# Patient Record
Sex: Male | Born: 1960
Health system: Southern US, Community
[De-identification: ages and names within clinical notes are randomized; demographics above are authoritative.]

## PROBLEM LIST (undated history)

## (undated) DIAGNOSIS — F329 Major depressive disorder, single episode, unspecified: Secondary | ICD-10-CM

## (undated) DIAGNOSIS — I10 Essential (primary) hypertension: Secondary | ICD-10-CM

## (undated) DIAGNOSIS — M719 Bursopathy, unspecified: Secondary | ICD-10-CM

## (undated) DIAGNOSIS — Z85819 Personal history of malignant neoplasm of unspecified site of lip, oral cavity, and pharynx: Secondary | ICD-10-CM

## (undated) DIAGNOSIS — E785 Hyperlipidemia, unspecified: Secondary | ICD-10-CM

## (undated) DIAGNOSIS — C439 Malignant melanoma of skin, unspecified: Secondary | ICD-10-CM

## (undated) DIAGNOSIS — F419 Anxiety disorder, unspecified: Secondary | ICD-10-CM

## (undated) DIAGNOSIS — F32A Depression, unspecified: Secondary | ICD-10-CM

## (undated) DIAGNOSIS — E119 Type 2 diabetes mellitus without complications: Secondary | ICD-10-CM

## (undated) DIAGNOSIS — E1142 Type 2 diabetes mellitus with diabetic polyneuropathy: Secondary | ICD-10-CM

## (undated) DIAGNOSIS — C799 Secondary malignant neoplasm of unspecified site: Secondary | ICD-10-CM

## (undated) DIAGNOSIS — K648 Other hemorrhoids: Secondary | ICD-10-CM

## (undated) DIAGNOSIS — K529 Noninfective gastroenteritis and colitis, unspecified: Secondary | ICD-10-CM

## (undated) DIAGNOSIS — K635 Polyp of colon: Secondary | ICD-10-CM

## (undated) HISTORY — PX: MELANOMA EXCISION: SHX5266

## (undated) HISTORY — PX: POLYPECTOMY: SHX149

## (undated) HISTORY — DX: Anxiety disorder, unspecified: F41.9

## (undated) HISTORY — DX: Secondary malignant neoplasm of unspecified site: C79.9

## (undated) HISTORY — PX: OTHER SURGICAL HISTORY: SHX169

## (undated) HISTORY — DX: Malignant melanoma of skin, unspecified: C43.9

## (undated) HISTORY — DX: Polyp of colon: K63.5

## (undated) HISTORY — DX: Noninfective gastroenteritis and colitis, unspecified: K52.9

## (undated) HISTORY — DX: Type 2 diabetes mellitus with diabetic polyneuropathy: E11.42

## (undated) HISTORY — DX: Bursopathy, unspecified: M71.9

## (undated) HISTORY — PX: COLONOSCOPY: SHX174

## (undated) HISTORY — DX: Other hemorrhoids: K64.8

## (undated) HISTORY — DX: Major depressive disorder, single episode, unspecified: F32.9

## (undated) HISTORY — DX: Depression, unspecified: F32.A

## (undated) HISTORY — DX: Essential (primary) hypertension: I10

## (undated) HISTORY — DX: Personal history of malignant neoplasm of unspecified site of lip, oral cavity, and pharynx: Z85.819

## (undated) HISTORY — DX: Hyperlipidemia, unspecified: E78.5

---

## 1999-03-15 ENCOUNTER — Emergency Department (HOSPITAL_COMMUNITY): Admission: EM | Admit: 1999-03-15 | Discharge: 1999-03-15 | Payer: Self-pay | Admitting: Emergency Medicine

## 2000-08-03 ENCOUNTER — Encounter: Payer: Self-pay | Admitting: Emergency Medicine

## 2000-08-03 ENCOUNTER — Emergency Department (HOSPITAL_COMMUNITY): Admission: EM | Admit: 2000-08-03 | Discharge: 2000-08-03 | Payer: Self-pay | Admitting: Emergency Medicine

## 2000-08-08 ENCOUNTER — Ambulatory Visit: Admission: RE | Admit: 2000-08-08 | Discharge: 2000-08-08 | Payer: Self-pay | Admitting: Internal Medicine

## 2001-06-24 ENCOUNTER — Encounter: Payer: Self-pay | Admitting: Emergency Medicine

## 2001-06-24 ENCOUNTER — Emergency Department (HOSPITAL_COMMUNITY): Admission: EM | Admit: 2001-06-24 | Discharge: 2001-06-24 | Payer: Self-pay | Admitting: Emergency Medicine

## 2002-04-29 ENCOUNTER — Encounter: Admission: RE | Admit: 2002-04-29 | Discharge: 2002-05-13 | Payer: Self-pay | Admitting: Internal Medicine

## 2002-10-20 ENCOUNTER — Emergency Department (HOSPITAL_COMMUNITY): Admission: EM | Admit: 2002-10-20 | Discharge: 2002-10-20 | Payer: Self-pay | Admitting: Emergency Medicine

## 2002-10-20 ENCOUNTER — Inpatient Hospital Stay (HOSPITAL_COMMUNITY): Admission: EM | Admit: 2002-10-20 | Discharge: 2002-10-23 | Payer: Self-pay | Admitting: *Deleted

## 2006-10-18 ENCOUNTER — Ambulatory Visit: Payer: Self-pay | Admitting: Internal Medicine

## 2006-11-05 ENCOUNTER — Inpatient Hospital Stay (HOSPITAL_COMMUNITY): Admission: EM | Admit: 2006-11-05 | Discharge: 2006-11-06 | Payer: Self-pay | Admitting: Emergency Medicine

## 2006-11-05 ENCOUNTER — Ambulatory Visit: Payer: Self-pay | Admitting: Cardiology

## 2006-11-06 ENCOUNTER — Ambulatory Visit: Payer: Self-pay

## 2007-08-06 ENCOUNTER — Ambulatory Visit: Payer: Self-pay | Admitting: Internal Medicine

## 2007-08-17 ENCOUNTER — Ambulatory Visit (HOSPITAL_COMMUNITY): Admission: RE | Admit: 2007-08-17 | Discharge: 2007-08-17 | Payer: Self-pay | Admitting: Internal Medicine

## 2007-08-17 ENCOUNTER — Encounter: Payer: Self-pay | Admitting: Internal Medicine

## 2007-08-27 ENCOUNTER — Ambulatory Visit: Payer: Self-pay | Admitting: Internal Medicine

## 2008-01-03 DIAGNOSIS — C439 Malignant melanoma of skin, unspecified: Secondary | ICD-10-CM | POA: Insufficient documentation

## 2008-01-03 DIAGNOSIS — D126 Benign neoplasm of colon, unspecified: Secondary | ICD-10-CM | POA: Insufficient documentation

## 2008-01-03 DIAGNOSIS — K649 Unspecified hemorrhoids: Secondary | ICD-10-CM | POA: Insufficient documentation

## 2008-08-14 ENCOUNTER — Telehealth: Payer: Self-pay | Admitting: Internal Medicine

## 2008-08-14 ENCOUNTER — Ambulatory Visit: Payer: Self-pay | Admitting: Internal Medicine

## 2008-08-14 DIAGNOSIS — R1032 Left lower quadrant pain: Secondary | ICD-10-CM | POA: Insufficient documentation

## 2008-08-14 LAB — CONVERTED CEMR LAB
Bacteria, UA: NEGATIVE
Basophils Absolute: 0 10*3/uL (ref 0.0–0.1)
Basophils Relative: 0.4 % (ref 0.0–3.0)
Bilirubin Urine: NEGATIVE
Crystals: NEGATIVE
Eosinophils Absolute: 0.1 10*3/uL (ref 0.0–0.7)
Eosinophils Relative: 1.4 % (ref 0.0–5.0)
HCT: 46 % (ref 39.0–52.0)
Hemoglobin, Urine: NEGATIVE
Hemoglobin: 15.9 g/dL (ref 13.0–17.0)
Ketones, ur: NEGATIVE mg/dL
Leukocytes, UA: NEGATIVE
Lymphocytes Relative: 34.1 % (ref 12.0–46.0)
MCHC: 34.5 g/dL (ref 30.0–36.0)
MCV: 90.6 fL (ref 78.0–100.0)
Monocytes Absolute: 0.7 10*3/uL (ref 0.1–1.0)
Monocytes Relative: 10.7 % (ref 3.0–12.0)
Neutro Abs: 3.2 10*3/uL (ref 1.4–7.7)
Neutrophils Relative %: 53.4 % (ref 43.0–77.0)
Nitrite: NEGATIVE
Platelets: 220 10*3/uL (ref 150–400)
RBC: 5.07 M/uL (ref 4.22–5.81)
RDW: 12.1 % (ref 11.5–14.6)
Specific Gravity, Urine: 1.015 (ref 1.000–1.03)
Squamous Epithelial / HPF: NEGATIVE /lpf
Total Protein, Urine: NEGATIVE mg/dL
Urine Glucose: NEGATIVE mg/dL
Urobilinogen, UA: 0.2 (ref 0.0–1.0)
WBC: 6.1 10*3/uL (ref 4.5–10.5)
pH: 7.5 (ref 5.0–8.0)

## 2008-10-24 HISTORY — PX: SHOULDER SURGERY: SHX246

## 2008-10-29 ENCOUNTER — Telehealth: Payer: Self-pay | Admitting: Internal Medicine

## 2008-10-31 ENCOUNTER — Ambulatory Visit (HOSPITAL_COMMUNITY): Admission: RE | Admit: 2008-10-31 | Discharge: 2008-10-31 | Payer: Self-pay | Admitting: Internal Medicine

## 2008-10-31 ENCOUNTER — Ambulatory Visit: Payer: Self-pay | Admitting: Internal Medicine

## 2008-10-31 DIAGNOSIS — K625 Hemorrhage of anus and rectum: Secondary | ICD-10-CM | POA: Insufficient documentation

## 2008-10-31 LAB — CONVERTED CEMR LAB
ALT: 28 units/L (ref 0–53)
AST: 19 units/L (ref 0–37)
Albumin: 4.2 g/dL (ref 3.5–5.2)
Alkaline Phosphatase: 67 units/L (ref 39–117)
BUN: 14 mg/dL (ref 6–23)
Bacteria, UA: NEGATIVE
Basophils Absolute: 0 10*3/uL (ref 0.0–0.1)
Basophils Relative: 0.4 % (ref 0.0–3.0)
Bilirubin Urine: NEGATIVE
CO2: 30 meq/L (ref 19–32)
Calcium: 9.3 mg/dL (ref 8.4–10.5)
Chloride: 104 meq/L (ref 96–112)
Creatinine, Ser: 1 mg/dL (ref 0.4–1.5)
Crystals: NEGATIVE
Eosinophils Absolute: 0.1 10*3/uL (ref 0.0–0.7)
Eosinophils Relative: 1.3 % (ref 0.0–5.0)
GFR calc Af Amer: 103 mL/min
GFR calc non Af Amer: 85 mL/min
Glucose, Bld: 100 mg/dL — ABNORMAL HIGH (ref 70–99)
HCT: 45.6 % (ref 39.0–52.0)
Hemoglobin, Urine: NEGATIVE
Hemoglobin: 16.1 g/dL (ref 13.0–17.0)
Ketones, ur: NEGATIVE mg/dL
Leukocytes, UA: NEGATIVE
Lymphocytes Relative: 32.3 % (ref 12.0–46.0)
MCHC: 35.2 g/dL (ref 30.0–36.0)
MCV: 88.7 fL (ref 78.0–100.0)
Monocytes Absolute: 0.6 10*3/uL (ref 0.1–1.0)
Monocytes Relative: 9.4 % (ref 3.0–12.0)
Mucus, UA: NEGATIVE
Neutro Abs: 3.4 10*3/uL (ref 1.4–7.7)
Neutrophils Relative %: 56.6 % (ref 43.0–77.0)
Nitrite: NEGATIVE
Platelets: 210 10*3/uL (ref 150–400)
Potassium: 4.1 meq/L (ref 3.5–5.1)
RBC: 5.15 M/uL (ref 4.22–5.81)
RDW: 12.3 % (ref 11.5–14.6)
Sodium: 140 meq/L (ref 135–145)
Specific Gravity, Urine: >=1.03 (ref 1.000–1.03)
Squamous Epithelial / HPF: NEGATIVE /lpf
Total Bilirubin: 1 mg/dL (ref 0.3–1.2)
Total Protein, Urine: NEGATIVE mg/dL
Total Protein: 7 g/dL (ref 6.0–8.3)
Urine Glucose: NEGATIVE mg/dL
Urobilinogen, UA: 0.2 (ref 0.0–1.0)
WBC: 6.1 10*3/uL (ref 4.5–10.5)
pH: 5 (ref 5.0–8.0)

## 2008-11-03 ENCOUNTER — Ambulatory Visit: Payer: Self-pay | Admitting: Internal Medicine

## 2008-11-03 ENCOUNTER — Ambulatory Visit (HOSPITAL_COMMUNITY): Admission: RE | Admit: 2008-11-03 | Discharge: 2008-11-03 | Payer: Self-pay | Admitting: Internal Medicine

## 2008-11-03 ENCOUNTER — Encounter: Payer: Self-pay | Admitting: Internal Medicine

## 2008-11-04 ENCOUNTER — Encounter: Payer: Self-pay | Admitting: Internal Medicine

## 2009-09-16 ENCOUNTER — Encounter: Admission: RE | Admit: 2009-09-16 | Discharge: 2009-09-16 | Payer: Self-pay | Admitting: General Practice

## 2009-11-12 ENCOUNTER — Ambulatory Visit (HOSPITAL_BASED_OUTPATIENT_CLINIC_OR_DEPARTMENT_OTHER): Admission: RE | Admit: 2009-11-12 | Discharge: 2009-11-12 | Payer: Self-pay | Admitting: Orthopedic Surgery

## 2009-12-14 ENCOUNTER — Telehealth: Payer: Self-pay | Admitting: Internal Medicine

## 2009-12-14 ENCOUNTER — Emergency Department (HOSPITAL_COMMUNITY): Admission: EM | Admit: 2009-12-14 | Discharge: 2009-12-14 | Payer: Self-pay | Admitting: Emergency Medicine

## 2009-12-18 ENCOUNTER — Telehealth: Payer: Self-pay | Admitting: Internal Medicine

## 2009-12-18 ENCOUNTER — Ambulatory Visit: Payer: Self-pay | Admitting: Internal Medicine

## 2010-11-16 ENCOUNTER — Ambulatory Visit: Payer: Self-pay | Admitting: Internal Medicine

## 2010-11-23 LAB — CBC WITH DIFFERENTIAL/PLATELET
BASO%: 0.4 % (ref 0.0–2.0)
Basophils Absolute: 0 10*3/uL (ref 0.0–0.1)
EOS%: 1.2 % (ref 0.0–7.0)
Eosinophils Absolute: 0.1 10*3/uL (ref 0.0–0.5)
HCT: 44.7 % (ref 38.4–49.9)
HGB: 15.2 g/dL (ref 13.0–17.1)
LYMPH%: 26.8 % (ref 14.0–49.0)
MCH: 30.2 pg (ref 27.2–33.4)
MCHC: 34.1 g/dL (ref 32.0–36.0)
MCV: 88.5 fL (ref 79.3–98.0)
MONO#: 0.4 10*3/uL (ref 0.1–0.9)
MONO%: 8.1 % (ref 0.0–14.0)
NEUT#: 3.2 10*3/uL (ref 1.5–6.5)
NEUT%: 63.5 % (ref 39.0–75.0)
Platelets: 194 10*3/uL (ref 140–400)
RBC: 5.05 10*6/uL (ref 4.20–5.82)
RDW: 13.2 % (ref 11.0–14.6)
WBC: 5.1 10*3/uL (ref 4.0–10.3)
lymph#: 1.4 10*3/uL (ref 0.9–3.3)

## 2010-11-23 LAB — COMPREHENSIVE METABOLIC PANEL
ALT: 13 U/L (ref 0–53)
AST: 15 U/L (ref 0–37)
Albumin: 4.5 g/dL (ref 3.5–5.2)
Alkaline Phosphatase: 76 U/L (ref 39–117)
BUN: 14 mg/dL (ref 6–23)
CO2: 26 mEq/L (ref 19–32)
Calcium: 9.3 mg/dL (ref 8.4–10.5)
Chloride: 104 mEq/L (ref 96–112)
Creatinine, Ser: 0.96 mg/dL (ref 0.40–1.50)
Glucose, Bld: 101 mg/dL — ABNORMAL HIGH (ref 70–99)
Potassium: 4.3 mEq/L (ref 3.5–5.3)
Sodium: 143 mEq/L (ref 135–145)
Total Bilirubin: 0.7 mg/dL (ref 0.3–1.2)
Total Protein: 6.8 g/dL (ref 6.0–8.3)

## 2010-11-23 LAB — LACTATE DEHYDROGENASE: LDH: 131 U/L (ref 94–250)

## 2010-11-23 NOTE — Procedures (Signed)
Summary: Colonoscopy  (polyp)   Colonoscopy  Procedure date:  11/03/2008  Findings:      Location:  [facility]    COLONOSCOPY PROCEDURE REPORT  PATIENT:  Nathaniel Turner, Nathaniel Turner  MR#:  [id number] BIRTHDATE:   05-29-1961   GENDER:   male  ENDOSCOPIST:   Hedwig Morton. Juanda Chance, MD Referred by: Louanna Raw, M.D.  PROCEDURE DATE:  11/03/2008 PROCEDURE:  Colonoscopy with biopsy and snare polypectomy ASA CLASS:   Class II INDICATIONS: abdominal pain, hematochezia acute abd. pain, KUB c/w resolving SBO, hx of Stage 4 melanoma, last colonoscopy Oct 2008, hx of hamartoma left colon  MEDICATIONS:    Versed 8 mcg, Fentanyl 100 mcg  DESCRIPTION OF PROCEDURE:   After the risks benefits and alternatives of the procedure were thoroughly explained, informed consent was obtained.  Digital rectal exam was performed and revealed no rectal masses.   The EC-[id number] ([id number]) endoscope was introduced through the anus and advanced to the cecum, which was identified by both the appendix and ileocecal valve, without limitations.  The quality of the prep was excellent, using MiraLax.  The instrument was then slowly withdrawn as the colon was fully examined. <<PROCEDUREIMAGES>>                <<OLD IMAGES>>  FINDINGS:  normal cecum (see image002 and image003).  A pedunculated polyp was found in the sigmoid colon. It was 2 cm in size. It was found 40 cm from the point of entry. Polyp was snared, then cauterized with monopolar cautery. Retrieval was successful (see image005 and image006). snare polyp large pedunculated polyp removed from 40 cm, appears multilobulated  Colitis was found in the sigmoid colon. multiple pinpoint circular erosions and abrasions in the left colon, not bleeding, c/w nonspecific,? infectious colitis, With standard forceps, biopsy was obtained and sent to pathology (see image004, image007, and image008).  Internal hemorrhoids were found (see image009 and image010). bleeding hem in the anal canal    Retroflexed views in the rectum revealed no abnormalities.    The scope was then withdrawn from the patient and the procedure completed.  COMPLICATIONS:   None  ENDOSCOPIC IMPRESSION:  1) Normal cecum  2) 2 cm pedunculated polyp in the sigmoid colon  3) Colitis in the sigmoid colon  4) Internal hemorrhoids  s/p snare polypectomy of a large sigmoid polyp,  nonspecific, resolving colitis in the sigmoid colon, likely infectious  bleeding hemorrhoid RECOMMENDATIONS:  1) await pathology results  2) Anusol HC supp. prn  advance diet to full liquids and to low residue diet  continue Cipro for 3 more days  AnusolHC cream and supp daily  Office Visit 2 weeks  REPEAT EXAM:   In 3 year(s)  (Recall is in IDX for 10/2011. Laureen Ochs LPN  November 05, 2008 10:03 AM)    _______________________________ Hedwig Morton. Juanda Chance, MD  CC:    This report was created from the original endoscopy report, which was reviewed and signed by the above listed endoscopist.

## 2010-11-23 NOTE — Miscellaneous (Signed)
Summary: Waiver of Liability/[place] [employer], industrial/product of Liability/[facility]   Imported By: Lester [place] 11/08/2008 08:54:31  _____________________________________________________________________  External Attachment:    Type:   Image     Comment:   External Document

## 2010-11-23 NOTE — Progress Notes (Signed)
Summary: TRIAGE   Phone Note Call from Patient Call back at Home Phone [phone]   Caller: Patient Call For: Juanda Chance Reason for Call: Talk to Nurse Summary of Call: Patient states that he has severe diarrhea and his rectum burns wants to know what to do. Initial call taken by: Tawni Levy,  December 18, 2009 3:54 PM  Follow-up for Phone Call        Since pt. saw Dr.Vann Okerlund this morning he has had 3 watery,yellow,burning BM's. States he burns so bad, "I had to run and jump in a tub of water" Heis on his way to the surgical appt. scheduled for him this morning.  1) Continue all instructions given to you this morning 2) Immodium as needed for diarrhea. 3) Keep appt. w/surgeon 4) I will call pt., if new orders, after MD reviews.  Follow-up by: Laureen Ochs LPN,  December 18, 2009 3:59 PM  Additional Follow-up for Phone Call Additional follow up Details #1::        please call pt on Monday 2/28, ask if he is better. If not, offer Hydrocodone 10/500, # 20 1 by mouth q 4hrs as needed,( I hope the strenght is right, could be 10/650) Additional Follow-up by: Hart Carwin MD,  December 19, 2009 2:32 PM    Additional Follow-up for Phone Call Additional follow up Details #2::    Message left for patient to callback. Laureen Ochs LPN  December 21, 2009 8:41 AM   Pt. saw the surgeon and was given a cream to use after every BM, states the inflammation has subsided, no further burning diarrhea. Pt. instructed to call back as needed.  Follow-up by: Laureen Ochs LPN,  December 21, 2009 9:03 AM

## 2010-11-23 NOTE — Assessment & Plan Note (Signed)
Summary: F/U FROM TRIAGE 10-29-08    (1PM APPT. PER DR.Abdoulaye Drum)    DEBORAH    History of Present Illness Visit Type: follow up Primary GI MD: Lina Sar MD Primary Provider: Antionette Char MD Requesting Provider: n/a Chief Complaint: f/u from traige diarrhea, nausea, vomitting History of Present Illness:   This is a 50 year old white male with stage IV melanoma followed at [facility]. Last appointment there was in December 2009 where he had a normal CT scan of the chest, abdomen and pelvis. He is an acute add on today because of acute abdominal pain which started 2 days ago while at the beach. After having a couple drinks and a regular supper, he woke up in the morning with severe abdominal pain, diarrhea and vomiting. He came back from the beach and called our office.We asked pt to stay on  clear liquids and take  Cipro 250 mg twice a day. He is somewhat improved but has not had any bowel movements. He has seen some blood per rectum. Patient had a colonoscopy in October 2008 with findings of a large hamartomatous  polyp of the left colon.While at [facility] in december 2009, his oncologist asked him to arrange for repeat colonoscopy.   GI Review of Systems    Reports abdominal pain, nausea, and  vomiting.     Location of  Abdominal pain: lower abdomen.    Denies acid reflux, belching, bloating, chest pain, dysphagia with liquids, dysphagia with solids, heartburn, loss of appetite, vomiting blood, weight loss, and  weight gain.      Reports diarrhea, rectal bleeding, and  rectal pain.     Denies anal fissure, black tarry stools, change in bowel habit, constipation, diverticulosis, fecal incontinence, heme positive stool, hemorrhoids, irritable bowel syndrome, jaundice, light color stool, and  liver problems.     Prior Medications Reviewed Using: Patient Recall  Updated Prior Medication List: BACTRIM 400-80 MG TABS (SULFAMETHOXAZOLE-TRIMETHOPRIM) 1 tablet every other day CIPRO 250 MG TABS (CIPROFLOXACIN HCL)  Take 1 tablet by mouth two times a day x 7 days  Current Allergies (reviewed today): No known allergies   Past Medical History:    Reviewed history from 01/03/2008 and no changes required:       Current Problems:        INTERNAL HEMORRHOIDS (ICD-455.0)       POLYP, COLON (ICD-211.3)       MELANOMA OF SKIN, SITE UNSPECIFIED (ICD-172.9)       HEMORRHOIDS (ICD-455.6)         Past Surgical History:    Reviewed history from 10/30/2008 and no changes required:       Melanoma Surgery with Lymph node removal 2008   Family History:    Reviewed history from 08/14/2008 and no changes required:       No FH of Colon Cancer:       Family History of Diabetes:grandmother        Family History of Heart Disease: grandfather  Social History:    Reviewed history from 08/14/2008 and no changes required:       Patient is a former smoker.        Alcohol Use - no       Illicit Drug Use - no    Review of Systems  The patient denies allergy/sinus, anemia, anxiety-new, arthritis/joint pain, back pain, blood in urine, breast changes/lumps, change in vision, confusion, cough, coughing up blood, depression-new, fainting, fatigue, fever, headaches-new, hearing problems, heart murmur, heart rhythm changes, itching, menstrual  pain, muscle pains/cramps, night sweats, nosebleeds, pregnancy symptoms, shortness of breath, skin rash, sleeping problems, sore throat, swelling of feet/legs, swollen lymph glands, thirst - excessive , urination - excessive , urination changes/pain, urine leakage, vision changes, and voice change.     Vital Signs:  Patient Profile:   50 Years Old Male Height:     73 inches Weight:      223.38 pounds BMI:     29.58 Pulse rate:   70 / minute Pulse rhythm:   regular BP sitting:   120 / 80  (left arm)  Vitals Entered By: Hortense Ramal CMA (October 31, 2008 1:03 PM)                  Physical Exam  General:     alert, oriented, in no distress. Well-developed muscular man,  Mouth:     No deformity or lesions, dentition normal. Neck:     no lymphadenopathy. Chest Wall:     scars on the back from melanoma excision. Lungs:     Clear throughout to auscultation. Heart:     Regular rate and rhythm; no murmurs, rubs or bruits. Abdomen:     soft abdomen with normal active bowel sounds, no distention, mild tenderness in left lower quadrant on deep pressure extending to the suprapubic area and periumbilical area. No fluid wave. Rectal:     bright red blood on the glove which was hemoccult-positive. There was no stool in the rectal ampulla. Msk:     Symmetrical with no gross deformities. Normal posture. Extremities:     no edema    Impression & Recommendations:  Problem # 1:  ABDOMINAL PAIN, LEFT LOWER QUADRANT (ICD-789.04) episode of acute lower abdominal pain. This is a recurrent episode  since  October 2009. He has a history of urinary tract infections which was treated successfully in October 09. This episode suggests a bowel obstruction or possibly food poisoning. We need to rule out diverticulitis and  ischemic colitis. We will proceed with a colonoscopy and will obtain baseline chemistries today including: CBC, CMET and urinalysis. We will review his KUB from today.  Problem # 2:  MELANOMA OF SKIN, SITE UNSPECIFIED (ICD-172.9) stage IV melanoma followed at [facility] in [place], [place]. His last appointment was in December 2009. At that time, his total body CT scan did not show any metastatic disease. Depending on the results of the colonoscopy, he may need another CT scan of the abdomen.  Other Orders: KUB (KUB) Colonoscopy (Colon) TLB-CBC Platelet - w/Differential (85025-CBCD) TLB-CMP (Comprehensive Metabolic Pnl) (80053-COMP) TLB-Udip w/ Micro (81001-URINE)   Patient Instructions: 1)  colonoscopy with MiraLax prep 2)  CBC, CMET, urinalysis 3)  Continue Cipro 250 mg p.o. b.i.d. 4)  KUB at [facility] this afternoon to rule out obstructive  pattern 5)  Copy Sent To:Dr. Antionette Char    Prescriptions: DULCOLAX 5 MG  TBEC (BISACODYL) Day before procedure take 2 at 3pm and 2 at 8pm.  #4 x 0   Entered by:   Hortense Ramal CMA   Authorized by:   Hart Carwin MD   Signed by:   Hortense Ramal CMA on 10/31/2008   Method used:   Electronically to        [facility]  [phone]* (retail)       [address]       [place], [place]  [address]       Ph:  [phone] or [phone]       Fax: [phone]   RxID:   [id number] REGLAN 10 MG  TABS (METOCLOPRAMIDE HCL) As per prep instructions.  #2 x 0   Entered by:   Hortense Ramal CMA   Authorized by:   Hart Carwin MD   Signed by:   Hortense Ramal CMA on 10/31/2008   Method used:   Electronically to        [facility]  [phone]* (retail)       [address]       [place], [place]  [address]       Ph: [phone] or [phone]       Fax: [phone]   RxID:   [id number] MIRALAX   POWD (POLYETHYLENE GLYCOL 3350) As per prep  instructions.  #255gm x 0   Entered by:   Hortense Ramal CMA   Authorized by:   Hart Carwin MD   Signed by:   Hortense Ramal CMA on 10/31/2008   Method used:   Electronically to        [facility]  [phone]* (retail)       [address]       [place], [place]  [address]       Ph: [phone] or [phone]       Fax: [phone]   RxID:   [id number]

## 2010-11-23 NOTE — Assessment & Plan Note (Signed)
Summary: POST ER VISIT//HEMORRHOIDS             [facility]    History of Present Illness Visit Type: Follow-up Visit Primary GI MD: Lina Sar MD Primary Provider: Antionette Char MD Requesting Provider: n/a Chief Complaint: Hemorrhoids and constipation  History of Present Illness:   This is a 50 year old white male with stage IV melanoma followed at [facility]. In December 2009, he had a a normal CT scan of the chest, abdomen and pelvis. He was most recently seen by [place] 1 year ago because of acute abdominal pain which while at the beach. After having a couple drinks and a regular supper, he woke up in the morning with severe abdominal pain, diarrhea and vomiting. Patient had a colonoscopy in October 2008 with findings of a large hamartomatous polyp of the left colon. While at [facility] in december 2009, his oncologist asked him to arrange for a repeat colonoscopy. Patient had his repeat colonoscopy in January 2010 which showed a normal cecum, 2 cm pedunculated polyp in the sigmoid colon, colitis in the sigmoid colon and internal hemorrhoids. Random biopsies of the colon showed begnin coloinc mucosa with no significant inflammation or other abnormalities. Biopsies of the polyp showed it to be adenomatous. Patient recently went to the [facility] Emergency Room on Monday, 12/14/09 for complaints of severe rectal pain from hemorrhoids. He tried soaking in warm bath water prior to his emergency room visit but only had minimal relief. He was at that time told to continue sitz baths. Patient comes today for a follow up. He complains of continued constipation and hemorrhoidal pain. He has continued to see blood per rectum with each bowel movement. He has taken Vicodin for the rectal pain. In 1991, he underwent a hemorrhoidal treatment in the office which was partially successful. Through the years, his hemorrhoids have recurred.    GI Review of Systems      Denies abdominal pain, acid reflux, belching, bloating, chest pain,  dysphagia with liquids, dysphagia with solids, heartburn, loss of appetite, nausea, vomiting, vomiting blood, weight loss, and  weight gain.      Reports constipation and  hemorrhoids.     Denies anal fissure, black tarry stools, change in bowel habit, diarrhea, diverticulosis, fecal incontinence, heme positive stool, irritable bowel syndrome, jaundice, light color stool, liver problems, rectal bleeding, and  rectal pain.    Current Medications (verified): 1)  Zithromax 1 Gm Pack (Azithromycin) .... Once Daily Times Three Daily 2)  Tussionex Pennkinetic Er 8-10 Mg/84ml Lqcr (Chlorpheniramine-Hydrocodone) .... As Needed For Cough 3)  A.e.r. Witch Hazel  Pads (Witch Hazel-Glycerin) .... As Directed 4)  Stool Softener 100 Mg Caps (Docusate Sodium) .... One Tablet By Mouth Three Times A Day  Allergies (verified): No Known Drug Allergies  Past History:  Past Medical History: Reviewed history from 01/03/2008 and no changes required. Current Problems:  INTERNAL HEMORRHOIDS (ICD-455.0) POLYP, COLON (ICD-211.3) MELANOMA OF SKIN, SITE UNSPECIFIED (ICD-172.9) HEMORRHOIDS (ICD-455.6)  Past Surgical History: Reviewed history from 10/30/2008 and no changes required. Melanoma Surgery with Lymph node removal 2008  Family History: Reviewed history from 08/14/2008 and no changes required. No FH of Colon Cancer: Family History of Diabetes:grandmother  Family History of Heart Disease: grandfather  Social History: Reviewed history from 08/14/2008 and no changes required. Patient is a former smoker.  Alcohol Use - no Illicit Drug Use - no  Review of Systems       The patient complains of allergy/sinus and cough.  The patient denies  anemia, anxiety-new, arthritis/joint pain, back pain, blood in urine, breast changes/lumps, change in vision, confusion, coughing up blood, depression-new, fainting, fatigue, fever, headaches-new, hearing problems, heart murmur, heart rhythm changes, itching, muscle  pains/cramps, night sweats, nosebleeds, shortness of breath, skin rash, sleeping problems, sore throat, swelling of feet/legs, swollen lymph glands, thirst - excessive, urination - excessive, urination changes/pain, urine leakage, vision changes, and voice change.         Pertinent positive and negative review of systems were noted in the above HPI. All other ROS was otherwise negative.   Vital Signs:  Patient profile:   50 year old male Height:      73 inches Weight:      219 pounds BMI:     29.00 BSA:     2.24 Pulse rate:   72 / minute Pulse rhythm:   regular BP sitting:   122 / 80  (left arm) Cuff size:   regular  Vitals Entered By: Ok Anis CMA (December 18, 2009 10:27 AM)  Physical Exam  General:  Well developed, well nourished, no acute distress. Eyes:  PERRLA, no icterus. Neck:  Supple; no masses or thyromegaly. Lungs:  Clear throughout to auscultation. Heart:  Regular rate and rhythm; no murmurs, rubs,  or bruits. Abdomen:  soft flaccid abdomen with normoactive bowel sounds. No tenderness except in the left lower quadrant with deep pressure. There is no rebound and no palpable mass. Liver edge at costal margin. Rectal:  rectal and anoscopic exam reveals external hemorrhoids and large internal hemorrhoids which are very painful and bleeding. There is slight pro -llapse into  the anal canal.One hemorrhoid is bleeding and is the size of a cherry. It bleeds on contact with the anoscope. Patient had a lot of discomfort during the exam due to rectal spasm. Skin:  s/p excosion of melanoma from the scapula   Impression & Recommendations:  Problem # 1:  HEMORRHAGE OF RECTUM AND ANUS (ICD-569.3)  Patient has large internal and external hemorrhoids currently flared up and bleeding. I cannot rule out a small anal fissure in addition the hemorrhoids because of the amount of pain he is having. There is associated rectal spasm. He is not allowed to use steroids due to his melanoma as  per his doctors at [facility]. We will start him on plain Preparation H  suppositories and Calmoseptine solution and I will make an appointment for him to see a surgeon today.  Orders: [facility])  Problem # 2:  ABDOMINAL PAIN, LEFT LOWER QUADRANT (ICD-789.04) Patient has irritable bowel syndrome with constipation predominance. Use MiraLax p.r.n.  Problem # 3:  MELANOMA OF SKIN, SITE UNSPECIFIED (ICD-172.9) followed at [facility] with immunotherapy.  Patient Instructions: 1)  Preperation H suppositories (patient has hx of melanoma and cannot use topical steroids). 2)  calmoseptine ointment t.i.d. 3)  Appointment in the surgical clinic this afternoon for possible injection or banding of the hemorrhoids. 4)  Sitz baths. 5)  Stay on full liquids for now. 6)  Copy sent to :Dr M.Martin, Dr Antionette Char 7)  The medication list was reviewed and reconciled.  All changed / newly prescribed medications were explained.  A complete medication list was provided to the patient / caregiver.

## 2010-11-23 NOTE — Letter (Signed)
Summary: [facility]   Imported By: Sherian Rein 12/30/2009 13:53:20  _____________________________________________________________________  External Attachment:    Type:   Image     Comment:   External Document

## 2010-11-23 NOTE — Progress Notes (Signed)
Summary: Triage-Hemorrhoids   Phone Note Call from Patient Call back at [facility] Phone [phone]   Caller: Patient Call For: Dr Juanda Chance Reason for Call: Talk to Nurse Summary of Call: Pt has nausea and diarrhea all weekend. Also his hemmorroids are bothering him. Went to the ER over the weekend and was given pain meds and cream. Initial call taken by: Karna Christmas,  December 14, 2009 8:05 AM  Follow-up for Phone Call        Pt. began with severe diarrhea/cramping  on Saturday. The diarrhea caused hemorrhoids to flare-up, pt. states they are external. He went to the ER last night, was given Vicodin and Tronolane cream to use up to 5x daily. Also instructed to continue sitz bath and to use a stool softner to avoid straining. Pt. calling to update Dr.Madelyn Tlatelpa and see if she has any further instructions. Pt. states, "I have been dealing with these hemorrhoids for several years, I am really in pain"  He declines an appt. wthe PA.  1) Continue Vicodin, Tronolane Cream and sitz baths 2) Pt. to keep scheduled office visit w/Dr.Vir Whetstine on 12-18-09 3) If symptoms become worse call back immediately or go to ER. 4) I will call pt., if new orders, after MD reviews.   DR.Selma Mink PLEASE FURTHER ADVISE   Follow-up by: Laureen Ochs LPN,  December 14, 2009 9:15 AM  Additional Follow-up for Phone Call Additional follow up Details #1::        reviewed and agree Additional Follow-up by: Hart Carwin MD,  December 14, 2009 1:08 PM

## 2010-11-26 NOTE — Procedures (Signed)
Summary: Colon Prep/[facility]  Colon Prep/[facility]   Imported By: Lester [place] 11/08/2008 08:52:52  _____________________________________________________________________  External Attachment:    Type:   Image     Comment:   External Document

## 2010-12-02 ENCOUNTER — Other Ambulatory Visit: Payer: Self-pay | Admitting: Internal Medicine

## 2010-12-02 DIAGNOSIS — R1909 Other intra-abdominal and pelvic swelling, mass and lump: Secondary | ICD-10-CM

## 2010-12-03 ENCOUNTER — Ambulatory Visit (HOSPITAL_COMMUNITY): Payer: Self-pay

## 2010-12-06 ENCOUNTER — Ambulatory Visit (HOSPITAL_COMMUNITY)
Admission: RE | Admit: 2010-12-06 | Discharge: 2010-12-06 | Disposition: A | Discharge status: Home / Self Care | Payer: PRIVATE HEALTH INSURANCE | Source: Ambulatory Visit | Attending: Internal Medicine | Admitting: Internal Medicine

## 2010-12-06 ENCOUNTER — Ambulatory Visit (HOSPITAL_COMMUNITY): Payer: PRIVATE HEALTH INSURANCE

## 2010-12-06 ENCOUNTER — Other Ambulatory Visit: Payer: Self-pay | Admitting: Internal Medicine

## 2010-12-06 DIAGNOSIS — C4360 Malignant melanoma of unspecified upper limb, including shoulder: Secondary | ICD-10-CM | POA: Insufficient documentation

## 2010-12-06 DIAGNOSIS — R599 Enlarged lymph nodes, unspecified: Secondary | ICD-10-CM | POA: Insufficient documentation

## 2010-12-06 DIAGNOSIS — C439 Malignant melanoma of skin, unspecified: Secondary | ICD-10-CM

## 2010-12-06 DIAGNOSIS — R1909 Other intra-abdominal and pelvic swelling, mass and lump: Secondary | ICD-10-CM

## 2010-12-06 LAB — APTT: aPTT: 32 seconds (ref 24–37)

## 2010-12-06 LAB — CBC
HCT: 44.6 % (ref 39.0–52.0)
Hemoglobin: 15.6 g/dL (ref 13.0–17.0)
MCH: 30.2 pg (ref 26.0–34.0)
MCHC: 35 g/dL (ref 30.0–36.0)
MCV: 86.4 fL (ref 78.0–100.0)
Platelets: 182 10*3/uL (ref 150–400)
RBC: 5.16 MIL/uL (ref 4.22–5.81)
RDW: 13.2 % (ref 11.5–15.5)
WBC: 6.1 10*3/uL (ref 4.0–10.5)

## 2010-12-06 LAB — PROTIME-INR
INR: 0.95 (ref 0.00–1.49)
Prothrombin Time: 12.9 seconds (ref 11.6–15.2)

## 2010-12-06 MED ORDER — IOHEXOL 300 MG/ML  SOLN: 100.0000 mL | Freq: Once | INTRAMUSCULAR | Status: AC | PRN

## 2010-12-16 ENCOUNTER — Other Ambulatory Visit: Payer: Self-pay | Admitting: Internal Medicine

## 2010-12-16 ENCOUNTER — Encounter (HOSPITAL_BASED_OUTPATIENT_CLINIC_OR_DEPARTMENT_OTHER): Payer: PRIVATE HEALTH INSURANCE | Admitting: Internal Medicine

## 2010-12-16 DIAGNOSIS — Z5112 Encounter for antineoplastic immunotherapy: Secondary | ICD-10-CM

## 2010-12-16 DIAGNOSIS — C439 Malignant melanoma of skin, unspecified: Secondary | ICD-10-CM

## 2010-12-16 LAB — CBC WITH DIFFERENTIAL/PLATELET
BASO%: 0.7 % (ref 0.0–2.0)
Basophils Absolute: 0 10*3/uL (ref 0.0–0.1)
EOS%: 1.9 % (ref 0.0–7.0)
Eosinophils Absolute: 0.1 10*3/uL (ref 0.0–0.5)
HCT: 42.8 % (ref 38.4–49.9)
HGB: 15.2 g/dL (ref 13.0–17.1)
LYMPH%: 32.9 % (ref 14.0–49.0)
MCH: 30.4 pg (ref 27.2–33.4)
MCHC: 35.5 g/dL (ref 32.0–36.0)
MCV: 85.6 fL (ref 79.3–98.0)
MONO#: 0.5 10*3/uL (ref 0.1–0.9)
MONO%: 9.2 % (ref 0.0–14.0)
NEUT#: 3.2 10*3/uL (ref 1.5–6.5)
NEUT%: 55.3 % (ref 39.0–75.0)
Platelets: 191 10*3/uL (ref 140–400)
RBC: 5 10*6/uL (ref 4.20–5.82)
RDW: 13.2 % (ref 11.0–14.6)
WBC: 5.8 10*3/uL (ref 4.0–10.3)
lymph#: 1.9 10*3/uL (ref 0.9–3.3)
nRBC: 0 % (ref 0–0)

## 2010-12-16 LAB — COMPREHENSIVE METABOLIC PANEL
ALT: 18 U/L (ref 0–53)
AST: 35 U/L (ref 0–37)
Albumin: 4 g/dL (ref 3.5–5.2)
Alkaline Phosphatase: 64 U/L (ref 39–117)
BUN: 14 mg/dL (ref 6–23)
CO2: 24 mEq/L (ref 19–32)
Calcium: 8.8 mg/dL (ref 8.4–10.5)
Chloride: 105 mEq/L (ref 96–112)
Creatinine, Ser: 0.81 mg/dL (ref 0.40–1.50)
Glucose, Bld: 110 mg/dL — ABNORMAL HIGH (ref 70–99)
Potassium: 4.6 mEq/L (ref 3.5–5.3)
Sodium: 137 mEq/L (ref 135–145)
Total Bilirubin: 1.1 mg/dL (ref 0.3–1.2)
Total Protein: 6.7 g/dL (ref 6.0–8.3)

## 2010-12-16 LAB — TSH: TSH: 0.88 u[IU]/mL (ref 0.350–4.500)

## 2010-12-16 LAB — LACTATE DEHYDROGENASE: LDH: 323 U/L — ABNORMAL HIGH (ref 94–250)

## 2010-12-23 ENCOUNTER — Encounter (HOSPITAL_BASED_OUTPATIENT_CLINIC_OR_DEPARTMENT_OTHER): Payer: PRIVATE HEALTH INSURANCE | Admitting: Internal Medicine

## 2010-12-23 ENCOUNTER — Other Ambulatory Visit: Payer: Self-pay | Admitting: Internal Medicine

## 2010-12-23 DIAGNOSIS — F329 Major depressive disorder, single episode, unspecified: Secondary | ICD-10-CM

## 2010-12-23 DIAGNOSIS — C773 Secondary and unspecified malignant neoplasm of axilla and upper limb lymph nodes: Secondary | ICD-10-CM

## 2010-12-23 DIAGNOSIS — F3289 Other specified depressive episodes: Secondary | ICD-10-CM

## 2010-12-23 DIAGNOSIS — C439 Malignant melanoma of skin, unspecified: Secondary | ICD-10-CM

## 2010-12-23 DIAGNOSIS — C792 Secondary malignant neoplasm of skin: Secondary | ICD-10-CM

## 2010-12-23 LAB — COMPREHENSIVE METABOLIC PANEL
ALT: 20 U/L (ref 0–53)
AST: 17 U/L (ref 0–37)
Albumin: 4.4 g/dL (ref 3.5–5.2)
Alkaline Phosphatase: 74 U/L (ref 39–117)
BUN: 15 mg/dL (ref 6–23)
CO2: 25 mEq/L (ref 19–32)
Calcium: 8.9 mg/dL (ref 8.4–10.5)
Chloride: 102 mEq/L (ref 96–112)
Creatinine, Ser: 0.93 mg/dL (ref 0.40–1.50)
Glucose, Bld: 99 mg/dL (ref 70–99)
Potassium: 3.9 mEq/L (ref 3.5–5.3)
Sodium: 139 mEq/L (ref 135–145)
Total Bilirubin: 0.5 mg/dL (ref 0.3–1.2)
Total Protein: 7.3 g/dL (ref 6.0–8.3)

## 2010-12-23 LAB — CBC WITH DIFFERENTIAL/PLATELET
BASO%: 0.3 % (ref 0.0–2.0)
Basophils Absolute: 0 10*3/uL (ref 0.0–0.1)
EOS%: 1.5 % (ref 0.0–7.0)
Eosinophils Absolute: 0.1 10*3/uL (ref 0.0–0.5)
HCT: 44 % (ref 38.4–49.9)
HGB: 15 g/dL (ref 13.0–17.1)
LYMPH%: 24.1 % (ref 14.0–49.0)
MCH: 30.2 pg (ref 27.2–33.4)
MCHC: 34.1 g/dL (ref 32.0–36.0)
MCV: 88.4 fL (ref 79.3–98.0)
MONO#: 0.6 10*3/uL (ref 0.1–0.9)
MONO%: 10.2 % (ref 0.0–14.0)
NEUT#: 3.7 10*3/uL (ref 1.5–6.5)
NEUT%: 63.9 % (ref 39.0–75.0)
Platelets: 206 10*3/uL (ref 140–400)
RBC: 4.98 10*6/uL (ref 4.20–5.82)
RDW: 13.4 % (ref 11.0–14.6)
WBC: 5.8 10*3/uL (ref 4.0–10.3)
lymph#: 1.4 10*3/uL (ref 0.9–3.3)

## 2010-12-23 LAB — TSH: TSH: 0.834 u[IU]/mL (ref 0.350–4.500)

## 2010-12-23 LAB — LACTATE DEHYDROGENASE: LDH: 138 U/L (ref 94–250)

## 2010-12-30 ENCOUNTER — Other Ambulatory Visit: Payer: Self-pay | Admitting: Internal Medicine

## 2010-12-30 ENCOUNTER — Encounter (HOSPITAL_BASED_OUTPATIENT_CLINIC_OR_DEPARTMENT_OTHER): Payer: PRIVATE HEALTH INSURANCE | Admitting: Internal Medicine

## 2010-12-30 DIAGNOSIS — C439 Malignant melanoma of skin, unspecified: Secondary | ICD-10-CM

## 2010-12-30 LAB — COMPREHENSIVE METABOLIC PANEL
ALT: 17 U/L (ref 0–53)
AST: 18 U/L (ref 0–37)
Albumin: 4.5 g/dL (ref 3.5–5.2)
Alkaline Phosphatase: 83 U/L (ref 39–117)
BUN: 14 mg/dL (ref 6–23)
CO2: 24 mEq/L (ref 19–32)
Calcium: 9.2 mg/dL (ref 8.4–10.5)
Chloride: 103 mEq/L (ref 96–112)
Creatinine, Ser: 0.84 mg/dL (ref 0.40–1.50)
Glucose, Bld: 147 mg/dL — ABNORMAL HIGH (ref 70–99)
Potassium: 4 mEq/L (ref 3.5–5.3)
Sodium: 141 mEq/L (ref 135–145)
Total Bilirubin: 0.5 mg/dL (ref 0.3–1.2)
Total Protein: 7 g/dL (ref 6.0–8.3)

## 2010-12-30 LAB — CBC WITH DIFFERENTIAL/PLATELET
BASO%: 0.4 % (ref 0.0–2.0)
Basophils Absolute: 0 10*3/uL (ref 0.0–0.1)
EOS%: 1.7 % (ref 0.0–7.0)
Eosinophils Absolute: 0.1 10*3/uL (ref 0.0–0.5)
HCT: 44 % (ref 38.4–49.9)
HGB: 15.1 g/dL (ref 13.0–17.1)
LYMPH%: 23 % (ref 14.0–49.0)
MCH: 30.3 pg (ref 27.2–33.4)
MCHC: 34.5 g/dL (ref 32.0–36.0)
MCV: 88.1 fL (ref 79.3–98.0)
MONO#: 0.6 10*3/uL (ref 0.1–0.9)
MONO%: 8 % (ref 0.0–14.0)
NEUT#: 4.6 10*3/uL (ref 1.5–6.5)
NEUT%: 66.9 % (ref 39.0–75.0)
Platelets: 215 10*3/uL (ref 140–400)
RBC: 4.99 10*6/uL (ref 4.20–5.82)
RDW: 13 % (ref 11.0–14.6)
WBC: 6.9 10*3/uL (ref 4.0–10.3)
lymph#: 1.6 10*3/uL (ref 0.9–3.3)

## 2010-12-30 LAB — LACTATE DEHYDROGENASE: LDH: 169 U/L (ref 94–250)

## 2011-01-01 ENCOUNTER — Emergency Department (HOSPITAL_COMMUNITY)
Admission: EM | Admit: 2011-01-01 | Discharge: 2011-01-01 | Disposition: A | Discharge status: Home / Self Care | Payer: PRIVATE HEALTH INSURANCE | Attending: Emergency Medicine | Admitting: Emergency Medicine

## 2011-01-01 ENCOUNTER — Emergency Department (HOSPITAL_COMMUNITY): Payer: PRIVATE HEALTH INSURANCE

## 2011-01-01 DIAGNOSIS — C439 Malignant melanoma of skin, unspecified: Secondary | ICD-10-CM | POA: Insufficient documentation

## 2011-01-01 DIAGNOSIS — R0609 Other forms of dyspnea: Secondary | ICD-10-CM | POA: Insufficient documentation

## 2011-01-01 DIAGNOSIS — C801 Malignant (primary) neoplasm, unspecified: Secondary | ICD-10-CM | POA: Insufficient documentation

## 2011-01-01 DIAGNOSIS — R0989 Other specified symptoms and signs involving the circulatory and respiratory systems: Secondary | ICD-10-CM | POA: Insufficient documentation

## 2011-01-01 DIAGNOSIS — R0602 Shortness of breath: Secondary | ICD-10-CM | POA: Insufficient documentation

## 2011-01-01 DIAGNOSIS — Z79899 Other long term (current) drug therapy: Secondary | ICD-10-CM | POA: Insufficient documentation

## 2011-01-01 LAB — CBC
HCT: 44 % (ref 39.0–52.0)
Hemoglobin: 14.9 g/dL (ref 13.0–17.0)
MCH: 29.6 pg (ref 26.0–34.0)
MCHC: 33.9 g/dL (ref 30.0–36.0)
MCV: 87.3 fL (ref 78.0–100.0)
Platelets: 215 10*3/uL (ref 150–400)
RBC: 5.04 MIL/uL (ref 4.22–5.81)
RDW: 12.6 % (ref 11.5–15.5)
WBC: 5.8 10*3/uL (ref 4.0–10.5)

## 2011-01-01 LAB — POCT CARDIAC MARKERS
CKMB, poc: <1 ng/mL — ABNORMAL LOW (ref 1.0–8.0)
Myoglobin, poc: 52.5 ng/mL (ref 12–200)
Troponin i, poc: <0.05 ng/mL (ref 0.00–0.09)

## 2011-01-01 LAB — DIFFERENTIAL
Basophils Absolute: 0 10*3/uL (ref 0.0–0.1)
Basophils Relative: 1 % (ref 0–1)
Eosinophils Absolute: 0.2 10*3/uL (ref 0.0–0.7)
Eosinophils Relative: 3 % (ref 0–5)
Lymphocytes Relative: 31 % (ref 12–46)
Lymphs Abs: 1.8 10*3/uL (ref 0.7–4.0)
Monocytes Absolute: 0.7 10*3/uL (ref 0.1–1.0)
Monocytes Relative: 13 % — ABNORMAL HIGH (ref 3–12)
Neutro Abs: 3.1 10*3/uL (ref 1.7–7.7)
Neutrophils Relative %: 53 % (ref 43–77)

## 2011-01-01 LAB — BASIC METABOLIC PANEL
BUN: 15 mg/dL (ref 6–23)
CO2: 26 mEq/L (ref 19–32)
Calcium: 9 mg/dL (ref 8.4–10.5)
Chloride: 105 mEq/L (ref 96–112)
Creatinine, Ser: 0.86 mg/dL (ref 0.4–1.5)
GFR calc Af Amer: >60 mL/min (ref >60)
GFR calc non Af Amer: >60 mL/min (ref >60)
Glucose, Bld: 120 mg/dL — ABNORMAL HIGH (ref 70–99)
Potassium: 4.1 mEq/L (ref 3.5–5.1)
Sodium: 138 mEq/L (ref 135–145)

## 2011-01-01 LAB — TROPONIN I: Troponin I: 0.01 ng/mL (ref 0.00–0.06)

## 2011-01-06 ENCOUNTER — Other Ambulatory Visit: Payer: Self-pay | Admitting: Internal Medicine

## 2011-01-06 ENCOUNTER — Encounter (HOSPITAL_BASED_OUTPATIENT_CLINIC_OR_DEPARTMENT_OTHER): Payer: PRIVATE HEALTH INSURANCE | Admitting: Internal Medicine

## 2011-01-06 DIAGNOSIS — R229 Localized swelling, mass and lump, unspecified: Secondary | ICD-10-CM

## 2011-01-06 DIAGNOSIS — Z5112 Encounter for antineoplastic immunotherapy: Secondary | ICD-10-CM

## 2011-01-06 DIAGNOSIS — C439 Malignant melanoma of skin, unspecified: Secondary | ICD-10-CM

## 2011-01-06 LAB — COMPREHENSIVE METABOLIC PANEL
ALT: 16 U/L (ref 0–53)
AST: 19 U/L (ref 0–37)
Albumin: 3.8 g/dL (ref 3.5–5.2)
Alkaline Phosphatase: 88 U/L (ref 39–117)
BUN: 14 mg/dL (ref 6–23)
CO2: 25 mEq/L (ref 19–32)
Calcium: 9 mg/dL (ref 8.4–10.5)
Chloride: 106 mEq/L (ref 96–112)
Creatinine, Ser: 0.83 mg/dL (ref 0.40–1.50)
Glucose, Bld: 152 mg/dL — ABNORMAL HIGH (ref 70–99)
Potassium: 3.9 mEq/L (ref 3.5–5.3)
Sodium: 140 mEq/L (ref 135–145)
Total Bilirubin: 0.7 mg/dL (ref 0.3–1.2)
Total Protein: 7.1 g/dL (ref 6.0–8.3)

## 2011-01-06 LAB — CBC WITH DIFFERENTIAL/PLATELET
BASO%: 0.3 % (ref 0.0–2.0)
Basophils Absolute: 0 10*3/uL (ref 0.0–0.1)
EOS%: 3.5 % (ref 0.0–7.0)
Eosinophils Absolute: 0.2 10*3/uL (ref 0.0–0.5)
HCT: 42.6 % (ref 38.4–49.9)
HGB: 14.9 g/dL (ref 13.0–17.1)
LYMPH%: 28 % (ref 14.0–49.0)
MCH: 29.8 pg (ref 27.2–33.4)
MCHC: 35 g/dL (ref 32.0–36.0)
MCV: 85.2 fL (ref 79.3–98.0)
MONO#: 0.4 10*3/uL (ref 0.1–0.9)
MONO%: 6.3 % (ref 0.0–14.0)
NEUT#: 4.3 10*3/uL (ref 1.5–6.5)
NEUT%: 61.9 % (ref 39.0–75.0)
Platelets: 237 10*3/uL (ref 140–400)
RBC: 5 10*6/uL (ref 4.20–5.82)
RDW: 12.7 % (ref 11.0–14.6)
WBC: 6.9 10*3/uL (ref 4.0–10.3)
lymph#: 1.9 10*3/uL (ref 0.9–3.3)
nRBC: 0 % (ref 0–0)

## 2011-01-06 LAB — TSH: TSH: 0.771 u[IU]/mL (ref 0.350–4.500)

## 2011-01-06 LAB — LACTATE DEHYDROGENASE: LDH: 143 U/L (ref 94–250)

## 2011-01-13 ENCOUNTER — Other Ambulatory Visit: Payer: Self-pay | Admitting: Internal Medicine

## 2011-01-13 ENCOUNTER — Encounter (HOSPITAL_BASED_OUTPATIENT_CLINIC_OR_DEPARTMENT_OTHER): Payer: PRIVATE HEALTH INSURANCE | Admitting: Internal Medicine

## 2011-01-13 DIAGNOSIS — C439 Malignant melanoma of skin, unspecified: Secondary | ICD-10-CM

## 2011-01-13 LAB — CBC WITH DIFFERENTIAL/PLATELET
BASO%: 0.4 % (ref 0.0–2.0)
Basophils Absolute: 0 10*3/uL (ref 0.0–0.1)
EOS%: 3.4 % (ref 0.0–7.0)
Eosinophils Absolute: 0.1 10*3/uL (ref 0.0–0.5)
HCT: 43.8 % (ref 38.4–49.9)
HGB: 15.1 g/dL (ref 13.0–17.1)
LYMPH%: 27.8 % (ref 14.0–49.0)
MCH: 30.1 pg (ref 27.2–33.4)
MCHC: 34.5 g/dL (ref 32.0–36.0)
MCV: 87.3 fL (ref 79.3–98.0)
MONO#: 0.6 10*3/uL (ref 0.1–0.9)
MONO%: 14.9 % — ABNORMAL HIGH (ref 0.0–14.0)
NEUT#: 2.2 10*3/uL (ref 1.5–6.5)
NEUT%: 53.5 % (ref 39.0–75.0)
Platelets: 239 10*3/uL (ref 140–400)
RBC: 5.01 10*6/uL (ref 4.20–5.82)
RDW: 13.2 % (ref 11.0–14.6)
WBC: 4.2 10*3/uL (ref 4.0–10.3)
lymph#: 1.2 10*3/uL (ref 0.9–3.3)

## 2011-01-13 LAB — COMPREHENSIVE METABOLIC PANEL
ALT: 21 U/L (ref 0–53)
AST: 21 U/L (ref 0–37)
Albumin: 3.8 g/dL (ref 3.5–5.2)
Alkaline Phosphatase: 91 U/L (ref 39–117)
BUN: 12 mg/dL (ref 6–23)
CO2: 28 mEq/L (ref 19–32)
Calcium: 8.8 mg/dL (ref 8.4–10.5)
Chloride: 105 mEq/L (ref 96–112)
Creatinine, Ser: 0.87 mg/dL (ref 0.40–1.50)
Glucose, Bld: 99 mg/dL (ref 70–99)
Potassium: 3.9 mEq/L (ref 3.5–5.3)
Sodium: 141 mEq/L (ref 135–145)
Total Bilirubin: 0.7 mg/dL (ref 0.3–1.2)
Total Protein: 7.4 g/dL (ref 6.0–8.3)

## 2011-01-13 LAB — LACTATE DEHYDROGENASE: LDH: 147 U/L (ref 94–250)

## 2011-01-20 ENCOUNTER — Other Ambulatory Visit: Payer: Self-pay | Admitting: Internal Medicine

## 2011-01-20 ENCOUNTER — Encounter (HOSPITAL_BASED_OUTPATIENT_CLINIC_OR_DEPARTMENT_OTHER): Payer: PRIVATE HEALTH INSURANCE | Admitting: Internal Medicine

## 2011-01-20 DIAGNOSIS — C439 Malignant melanoma of skin, unspecified: Secondary | ICD-10-CM

## 2011-01-20 LAB — COMPREHENSIVE METABOLIC PANEL
ALT: 31 U/L (ref 0–53)
AST: 25 U/L (ref 0–37)
Albumin: 3.7 g/dL (ref 3.5–5.2)
Alkaline Phosphatase: 92 U/L (ref 39–117)
BUN: 13 mg/dL (ref 6–23)
CO2: 29 mEq/L (ref 19–32)
Calcium: 8.4 mg/dL (ref 8.4–10.5)
Chloride: 106 mEq/L (ref 96–112)
Creatinine, Ser: 0.88 mg/dL (ref 0.40–1.50)
Glucose, Bld: 88 mg/dL (ref 70–99)
Potassium: 3.5 mEq/L (ref 3.5–5.3)
Sodium: 142 mEq/L (ref 135–145)
Total Bilirubin: 0.7 mg/dL (ref 0.3–1.2)
Total Protein: 6.9 g/dL (ref 6.0–8.3)

## 2011-01-20 LAB — CBC WITH DIFFERENTIAL/PLATELET
BASO%: 0.4 % (ref 0.0–2.0)
Basophils Absolute: 0 10*3/uL (ref 0.0–0.1)
EOS%: 2.4 % (ref 0.0–7.0)
Eosinophils Absolute: 0.2 10*3/uL (ref 0.0–0.5)
HCT: 42.4 % (ref 38.4–49.9)
HGB: 14.3 g/dL (ref 13.0–17.1)
LYMPH%: 21.6 % (ref 14.0–49.0)
MCH: 29.7 pg (ref 27.2–33.4)
MCHC: 33.8 g/dL (ref 32.0–36.0)
MCV: 87.7 fL (ref 79.3–98.0)
MONO#: 0.4 10*3/uL (ref 0.1–0.9)
MONO%: 7 % (ref 0.0–14.0)
NEUT#: 4.3 10*3/uL (ref 1.5–6.5)
NEUT%: 68.6 % (ref 39.0–75.0)
Platelets: 219 10*3/uL (ref 140–400)
RBC: 4.83 10*6/uL (ref 4.20–5.82)
RDW: 13.2 % (ref 11.0–14.6)
WBC: 6.3 10*3/uL (ref 4.0–10.3)
lymph#: 1.4 10*3/uL (ref 0.9–3.3)

## 2011-01-20 LAB — LACTATE DEHYDROGENASE: LDH: 161 U/L (ref 94–250)

## 2011-01-27 ENCOUNTER — Encounter (HOSPITAL_BASED_OUTPATIENT_CLINIC_OR_DEPARTMENT_OTHER): Payer: PRIVATE HEALTH INSURANCE | Admitting: Internal Medicine

## 2011-01-27 ENCOUNTER — Other Ambulatory Visit: Payer: Self-pay | Admitting: Internal Medicine

## 2011-01-27 DIAGNOSIS — Z5112 Encounter for antineoplastic immunotherapy: Secondary | ICD-10-CM

## 2011-01-27 DIAGNOSIS — C439 Malignant melanoma of skin, unspecified: Secondary | ICD-10-CM

## 2011-01-27 LAB — CBC WITH DIFFERENTIAL/PLATELET
BASO%: 0.5 % (ref 0.0–2.0)
Basophils Absolute: 0 10*3/uL (ref 0.0–0.1)
EOS%: 4.9 % (ref 0.0–7.0)
Eosinophils Absolute: 0.3 10*3/uL (ref 0.0–0.5)
HCT: 42.1 % (ref 38.4–49.9)
HGB: 14.7 g/dL (ref 13.0–17.1)
LYMPH%: 32 % (ref 14.0–49.0)
MCH: 29.6 pg (ref 27.2–33.4)
MCHC: 34.9 g/dL (ref 32.0–36.0)
MCV: 84.7 fL (ref 79.3–98.0)
MONO#: 0.6 10*3/uL (ref 0.1–0.9)
MONO%: 10 % (ref 0.0–14.0)
NEUT#: 3.2 10*3/uL (ref 1.5–6.5)
NEUT%: 52.6 % (ref 39.0–75.0)
Platelets: 211 10*3/uL (ref 140–400)
RBC: 4.97 10*6/uL (ref 4.20–5.82)
RDW: 13.2 % (ref 11.0–14.6)
WBC: 6.1 10*3/uL (ref 4.0–10.3)
lymph#: 2 10*3/uL (ref 0.9–3.3)
nRBC: 0 % (ref 0–0)

## 2011-01-27 LAB — COMPREHENSIVE METABOLIC PANEL
ALT: 23 U/L (ref 0–53)
AST: 25 U/L (ref 0–37)
Albumin: 3.7 g/dL (ref 3.5–5.2)
Alkaline Phosphatase: 68 U/L (ref 39–117)
BUN: 14 mg/dL (ref 6–23)
CO2: 27 mEq/L (ref 19–32)
Calcium: 8.7 mg/dL (ref 8.4–10.5)
Chloride: 107 mEq/L (ref 96–112)
Creatinine, Ser: 0.87 mg/dL (ref 0.40–1.50)
Glucose, Bld: 163 mg/dL — ABNORMAL HIGH (ref 70–99)
Potassium: 3.8 mEq/L (ref 3.5–5.3)
Sodium: 141 mEq/L (ref 135–145)
Total Bilirubin: 0.5 mg/dL (ref 0.3–1.2)
Total Protein: 6.7 g/dL (ref 6.0–8.3)

## 2011-01-27 LAB — LACTATE DEHYDROGENASE: LDH: 185 U/L (ref 94–250)

## 2011-01-27 LAB — TSH: TSH: 1.191 u[IU]/mL (ref 0.350–4.500)

## 2011-02-03 ENCOUNTER — Encounter (HOSPITAL_BASED_OUTPATIENT_CLINIC_OR_DEPARTMENT_OTHER): Payer: PRIVATE HEALTH INSURANCE | Admitting: Internal Medicine

## 2011-02-03 ENCOUNTER — Other Ambulatory Visit: Payer: Self-pay | Admitting: Internal Medicine

## 2011-02-03 DIAGNOSIS — C439 Malignant melanoma of skin, unspecified: Secondary | ICD-10-CM

## 2011-02-03 LAB — CBC WITH DIFFERENTIAL/PLATELET
BASO%: 0.6 % (ref 0.0–2.0)
Basophils Absolute: 0 10*3/uL (ref 0.0–0.1)
EOS%: 2.4 % (ref 0.0–7.0)
Eosinophils Absolute: 0.1 10*3/uL (ref 0.0–0.5)
HCT: 43.3 % (ref 38.4–49.9)
HGB: 14.8 g/dL (ref 13.0–17.1)
LYMPH%: 24.3 % (ref 14.0–49.0)
MCH: 29.8 pg (ref 27.2–33.4)
MCHC: 34.2 g/dL (ref 32.0–36.0)
MCV: 87.3 fL (ref 79.3–98.0)
MONO#: 0.8 10*3/uL (ref 0.1–0.9)
MONO%: 14.3 % — ABNORMAL HIGH (ref 0.0–14.0)
NEUT#: 3.4 10*3/uL (ref 1.5–6.5)
NEUT%: 58.4 % (ref 39.0–75.0)
Platelets: 199 10*3/uL (ref 140–400)
RBC: 4.96 10*6/uL (ref 4.20–5.82)
RDW: 13.2 % (ref 11.0–14.6)
WBC: 5.9 10*3/uL (ref 4.0–10.3)
lymph#: 1.4 10*3/uL (ref 0.9–3.3)

## 2011-02-03 LAB — COMPREHENSIVE METABOLIC PANEL
ALT: 21 U/L (ref 0–53)
AST: 20 U/L (ref 0–37)
Albumin: 4 g/dL (ref 3.5–5.2)
Alkaline Phosphatase: 86 U/L (ref 39–117)
BUN: 12 mg/dL (ref 6–23)
CO2: 29 mEq/L (ref 19–32)
Calcium: 9.4 mg/dL (ref 8.4–10.5)
Chloride: 105 mEq/L (ref 96–112)
Creatinine, Ser: 1.04 mg/dL (ref 0.40–1.50)
Glucose, Bld: 95 mg/dL (ref 70–99)
Potassium: 4.5 mEq/L (ref 3.5–5.3)
Sodium: 141 mEq/L (ref 135–145)
Total Bilirubin: 0.9 mg/dL (ref 0.3–1.2)
Total Protein: 7.4 g/dL (ref 6.0–8.3)

## 2011-02-03 LAB — LACTATE DEHYDROGENASE: LDH: 155 U/L (ref 94–250)

## 2011-02-10 ENCOUNTER — Encounter (HOSPITAL_BASED_OUTPATIENT_CLINIC_OR_DEPARTMENT_OTHER): Payer: PRIVATE HEALTH INSURANCE | Admitting: Internal Medicine

## 2011-02-10 ENCOUNTER — Other Ambulatory Visit: Payer: Self-pay | Admitting: Internal Medicine

## 2011-02-10 DIAGNOSIS — C439 Malignant melanoma of skin, unspecified: Secondary | ICD-10-CM

## 2011-02-10 LAB — CBC WITH DIFFERENTIAL/PLATELET
BASO%: 0.7 % (ref 0.0–2.0)
Basophils Absolute: 0 10*3/uL (ref 0.0–0.1)
EOS%: 6 % (ref 0.0–7.0)
Eosinophils Absolute: 0.3 10*3/uL (ref 0.0–0.5)
HCT: 40.6 % (ref 38.4–49.9)
HGB: 13.9 g/dL (ref 13.0–17.1)
LYMPH%: 37.2 % (ref 14.0–49.0)
MCH: 29.9 pg (ref 27.2–33.4)
MCHC: 34.2 g/dL (ref 32.0–36.0)
MCV: 87.7 fL (ref 79.3–98.0)
MONO#: 0.6 10*3/uL (ref 0.1–0.9)
MONO%: 10.8 % (ref 0.0–14.0)
NEUT#: 2.5 10*3/uL (ref 1.5–6.5)
NEUT%: 45.3 % (ref 39.0–75.0)
Platelets: 219 10*3/uL (ref 140–400)
RBC: 4.63 10*6/uL (ref 4.20–5.82)
RDW: 13.3 % (ref 11.0–14.6)
WBC: 5.4 10*3/uL (ref 4.0–10.3)
lymph#: 2 10*3/uL (ref 0.9–3.3)

## 2011-02-10 LAB — COMPREHENSIVE METABOLIC PANEL
ALT: 21 U/L (ref 0–53)
AST: 22 U/L (ref 0–37)
Albumin: 3.6 g/dL (ref 3.5–5.2)
Alkaline Phosphatase: 73 U/L (ref 39–117)
BUN: 11 mg/dL (ref 6–23)
CO2: 29 mEq/L (ref 19–32)
Calcium: 8.7 mg/dL (ref 8.4–10.5)
Chloride: 105 mEq/L (ref 96–112)
Creatinine, Ser: 0.84 mg/dL (ref 0.40–1.50)
Glucose, Bld: 88 mg/dL (ref 70–99)
Potassium: 4 mEq/L (ref 3.5–5.3)
Sodium: 140 mEq/L (ref 135–145)
Total Bilirubin: 0.6 mg/dL (ref 0.3–1.2)
Total Protein: 6.8 g/dL (ref 6.0–8.3)

## 2011-02-10 LAB — LACTATE DEHYDROGENASE: LDH: 149 U/L (ref 94–250)

## 2011-02-17 ENCOUNTER — Other Ambulatory Visit: Payer: Self-pay | Admitting: Internal Medicine

## 2011-02-17 ENCOUNTER — Encounter (HOSPITAL_BASED_OUTPATIENT_CLINIC_OR_DEPARTMENT_OTHER): Payer: PRIVATE HEALTH INSURANCE | Admitting: Internal Medicine

## 2011-02-17 DIAGNOSIS — IMO0002 Reserved for concepts with insufficient information to code with codable children: Secondary | ICD-10-CM

## 2011-02-17 DIAGNOSIS — R52 Pain, unspecified: Secondary | ICD-10-CM

## 2011-02-17 DIAGNOSIS — Z5112 Encounter for antineoplastic immunotherapy: Secondary | ICD-10-CM

## 2011-02-17 DIAGNOSIS — C439 Malignant melanoma of skin, unspecified: Secondary | ICD-10-CM

## 2011-02-17 LAB — CBC WITH DIFFERENTIAL/PLATELET
BASO%: 0.6 % (ref 0.0–2.0)
Basophils Absolute: 0.1 10*3/uL (ref 0.0–0.1)
EOS%: 3.7 % (ref 0.0–7.0)
Eosinophils Absolute: 0.3 10*3/uL (ref 0.0–0.5)
HCT: 43.1 % (ref 38.4–49.9)
HGB: 14.8 g/dL (ref 13.0–17.1)
LYMPH%: 23.9 % (ref 14.0–49.0)
MCH: 29.1 pg (ref 27.2–33.4)
MCHC: 34.3 g/dL (ref 32.0–36.0)
MCV: 84.8 fL (ref 79.3–98.0)
MONO#: 0.7 10*3/uL (ref 0.1–0.9)
MONO%: 7.7 % (ref 0.0–14.0)
NEUT#: 5.7 10*3/uL (ref 1.5–6.5)
NEUT%: 64.1 % (ref 39.0–75.0)
Platelets: 200 10*3/uL (ref 140–400)
RBC: 5.08 10*6/uL (ref 4.20–5.82)
RDW: 13 % (ref 11.0–14.6)
WBC: 8.9 10*3/uL (ref 4.0–10.3)
lymph#: 2.1 10*3/uL (ref 0.9–3.3)
nRBC: 0 % (ref 0–0)

## 2011-02-17 LAB — COMPREHENSIVE METABOLIC PANEL
ALT: 21 U/L (ref 0–53)
AST: 18 U/L (ref 0–37)
Albumin: 3.9 g/dL (ref 3.5–5.2)
Alkaline Phosphatase: 88 U/L (ref 39–117)
BUN: 12 mg/dL (ref 6–23)
CO2: 28 mEq/L (ref 19–32)
Calcium: 9.4 mg/dL (ref 8.4–10.5)
Chloride: 105 mEq/L (ref 96–112)
Creatinine, Ser: 0.86 mg/dL (ref 0.40–1.50)
Glucose, Bld: 115 mg/dL — ABNORMAL HIGH (ref 70–99)
Potassium: 3.8 mEq/L (ref 3.5–5.3)
Sodium: 142 mEq/L (ref 135–145)
Total Bilirubin: 0.7 mg/dL (ref 0.3–1.2)
Total Protein: 7.6 g/dL (ref 6.0–8.3)

## 2011-02-17 LAB — TSH: TSH: 0.885 u[IU]/mL (ref 0.350–4.500)

## 2011-02-17 LAB — LACTATE DEHYDROGENASE: LDH: 154 U/L (ref 94–250)

## 2011-02-18 ENCOUNTER — Other Ambulatory Visit: Payer: Self-pay | Admitting: Internal Medicine

## 2011-02-18 ENCOUNTER — Encounter (HOSPITAL_COMMUNITY): Payer: Self-pay

## 2011-02-18 ENCOUNTER — Ambulatory Visit (HOSPITAL_COMMUNITY)
Admission: RE | Admit: 2011-02-18 | Discharge: 2011-02-18 | Disposition: A | Discharge status: Home / Self Care | Payer: PRIVATE HEALTH INSURANCE | Source: Ambulatory Visit | Attending: Internal Medicine | Admitting: Internal Medicine

## 2011-02-18 DIAGNOSIS — M79609 Pain in unspecified limb: Secondary | ICD-10-CM | POA: Insufficient documentation

## 2011-02-18 DIAGNOSIS — Z85820 Personal history of malignant melanoma of skin: Secondary | ICD-10-CM | POA: Insufficient documentation

## 2011-02-18 DIAGNOSIS — C439 Malignant melanoma of skin, unspecified: Secondary | ICD-10-CM

## 2011-02-18 DIAGNOSIS — L03119 Cellulitis of unspecified part of limb: Secondary | ICD-10-CM | POA: Insufficient documentation

## 2011-02-18 DIAGNOSIS — M7989 Other specified soft tissue disorders: Secondary | ICD-10-CM | POA: Insufficient documentation

## 2011-02-18 DIAGNOSIS — IMO0002 Reserved for concepts with insufficient information to code with codable children: Secondary | ICD-10-CM

## 2011-02-18 DIAGNOSIS — L02419 Cutaneous abscess of limb, unspecified: Secondary | ICD-10-CM | POA: Insufficient documentation

## 2011-02-18 MED ORDER — IOHEXOL 300 MG/ML  SOLN
100.0000 mL | Freq: Once | INTRAMUSCULAR | Status: AC | PRN
  Administered 2011-02-18: 100 mL via INTRAVENOUS

## 2011-02-23 ENCOUNTER — Emergency Department (HOSPITAL_COMMUNITY): Payer: PRIVATE HEALTH INSURANCE

## 2011-02-23 ENCOUNTER — Inpatient Hospital Stay (HOSPITAL_COMMUNITY)
Admission: EM | Admit: 2011-02-23 | Discharge: 2011-02-25 | DRG: 392 | Disposition: A | Discharge status: Home / Self Care | Payer: PRIVATE HEALTH INSURANCE | Attending: Internal Medicine | Admitting: Internal Medicine

## 2011-02-23 DIAGNOSIS — L02419 Cutaneous abscess of limb, unspecified: Secondary | ICD-10-CM | POA: Diagnosis present

## 2011-02-23 DIAGNOSIS — A09 Infectious gastroenteritis and colitis, unspecified: Principal | ICD-10-CM | POA: Diagnosis present

## 2011-02-23 DIAGNOSIS — T451X5A Adverse effect of antineoplastic and immunosuppressive drugs, initial encounter: Secondary | ICD-10-CM | POA: Diagnosis present

## 2011-02-23 DIAGNOSIS — C439 Malignant melanoma of skin, unspecified: Secondary | ICD-10-CM | POA: Diagnosis present

## 2011-02-23 DIAGNOSIS — L03119 Cellulitis of unspecified part of limb: Secondary | ICD-10-CM | POA: Diagnosis present

## 2011-02-23 DIAGNOSIS — R599 Enlarged lymph nodes, unspecified: Secondary | ICD-10-CM | POA: Diagnosis present

## 2011-02-23 HISTORY — DX: Malignant melanoma of skin, unspecified: C43.9

## 2011-02-23 LAB — COMPREHENSIVE METABOLIC PANEL
ALT: 17 U/L (ref 0–53)
AST: 28 U/L (ref 0–37)
Albumin: 3.8 g/dL (ref 3.5–5.2)
Alkaline Phosphatase: 97 U/L (ref 39–117)
BUN: 13 mg/dL (ref 6–23)
CO2: 25 mEq/L (ref 19–32)
Calcium: 9.6 mg/dL (ref 8.4–10.5)
Chloride: 103 mEq/L (ref 96–112)
Creatinine, Ser: 0.93 mg/dL (ref 0.4–1.5)
GFR calc Af Amer: >60 mL/min (ref >60)
GFR calc non Af Amer: >60 mL/min (ref >60)
Glucose, Bld: 86 mg/dL (ref 70–99)
Potassium: 4.7 mEq/L (ref 3.5–5.1)
Sodium: 139 mEq/L (ref 135–145)
Total Bilirubin: 0.4 mg/dL (ref 0.3–1.2)
Total Protein: 8 g/dL (ref 6.0–8.3)

## 2011-02-23 LAB — DIFFERENTIAL
Basophils Absolute: 0.1 10*3/uL (ref 0.0–0.1)
Basophils Relative: 1 % (ref 0–1)
Eosinophils Absolute: 0.4 10*3/uL (ref 0.0–0.7)
Eosinophils Relative: 5 % (ref 0–5)
Lymphocytes Relative: 25 % (ref 12–46)
Lymphs Abs: 2.2 10*3/uL (ref 0.7–4.0)
Monocytes Absolute: 1.2 10*3/uL — ABNORMAL HIGH (ref 0.1–1.0)
Monocytes Relative: 14 % — ABNORMAL HIGH (ref 3–12)
Neutro Abs: 4.8 10*3/uL (ref 1.7–7.7)
Neutrophils Relative %: 56 % (ref 43–77)

## 2011-02-23 LAB — CBC
HCT: 42.2 % (ref 39.0–52.0)
Hemoglobin: 14.7 g/dL (ref 13.0–17.0)
MCH: 29.2 pg (ref 26.0–34.0)
MCHC: 34.8 g/dL (ref 30.0–36.0)
MCV: 83.7 fL (ref 78.0–100.0)
Platelets: 235 10*3/uL (ref 150–400)
RBC: 5.04 MIL/uL (ref 4.22–5.81)
RDW: 12.7 % (ref 11.5–15.5)
WBC: 8.7 10*3/uL (ref 4.0–10.5)

## 2011-02-24 ENCOUNTER — Inpatient Hospital Stay (HOSPITAL_COMMUNITY): Payer: PRIVATE HEALTH INSURANCE

## 2011-02-24 ENCOUNTER — Encounter (HOSPITAL_COMMUNITY): Payer: Self-pay | Admitting: Radiology

## 2011-02-24 DIAGNOSIS — C439 Malignant melanoma of skin, unspecified: Secondary | ICD-10-CM

## 2011-02-24 LAB — COMPREHENSIVE METABOLIC PANEL
ALT: 13 U/L (ref 0–53)
AST: 13 U/L (ref 0–37)
Albumin: 3.4 g/dL — ABNORMAL LOW (ref 3.5–5.2)
Alkaline Phosphatase: 90 U/L (ref 39–117)
BUN: 12 mg/dL (ref 6–23)
CO2: 24 mEq/L (ref 19–32)
Calcium: 8.9 mg/dL (ref 8.4–10.5)
Chloride: 107 mEq/L (ref 96–112)
Creatinine, Ser: 0.88 mg/dL (ref 0.4–1.5)
GFR calc Af Amer: >60 mL/min (ref >60)
GFR calc non Af Amer: >60 mL/min (ref >60)
Glucose, Bld: 165 mg/dL — ABNORMAL HIGH (ref 70–99)
Potassium: 4 mEq/L (ref 3.5–5.1)
Sodium: 140 mEq/L (ref 135–145)
Total Bilirubin: 0.3 mg/dL (ref 0.3–1.2)
Total Protein: 6.4 g/dL (ref 6.0–8.3)

## 2011-02-24 LAB — CBC
HCT: 39.4 % (ref 39.0–52.0)
Hemoglobin: 13.5 g/dL (ref 13.0–17.0)
MCH: 29 pg (ref 26.0–34.0)
MCHC: 34.3 g/dL (ref 30.0–36.0)
MCV: 84.5 fL (ref 78.0–100.0)
Platelets: 202 10*3/uL (ref 150–400)
RBC: 4.66 MIL/uL (ref 4.22–5.81)
RDW: 12.8 % (ref 11.5–15.5)
WBC: 7.2 10*3/uL (ref 4.0–10.5)

## 2011-02-24 LAB — DIFFERENTIAL
Basophils Absolute: 0 10*3/uL (ref 0.0–0.1)
Basophils Relative: 0 % (ref 0–1)
Eosinophils Absolute: 0 10*3/uL (ref 0.0–0.7)
Eosinophils Relative: 0 % (ref 0–5)
Lymphocytes Relative: 13 % (ref 12–46)
Lymphs Abs: 0.9 10*3/uL (ref 0.7–4.0)
Monocytes Absolute: 0.1 10*3/uL (ref 0.1–1.0)
Monocytes Relative: 2 % — ABNORMAL LOW (ref 3–12)
Neutro Abs: 6.2 10*3/uL (ref 1.7–7.7)
Neutrophils Relative %: 86 % — ABNORMAL HIGH (ref 43–77)

## 2011-02-24 LAB — PROCALCITONIN: Procalcitonin: <0.1 ng/mL

## 2011-02-24 LAB — PHOSPHORUS: Phosphorus: 3.5 mg/dL (ref 2.3–4.6)

## 2011-02-24 LAB — CLOSTRIDIUM DIFFICILE BY PCR: Toxigenic C. Difficile by PCR: NEGATIVE

## 2011-02-24 LAB — LACTIC ACID, PLASMA: Lactic Acid, Venous: 1.4 mmol/L (ref 0.5–2.2)

## 2011-02-24 LAB — MAGNESIUM: Magnesium: 2.2 mg/dL (ref 1.5–2.5)

## 2011-02-24 LAB — AMYLASE: Amylase: 29 U/L (ref 0–105)

## 2011-02-24 LAB — LIPASE, BLOOD: Lipase: 16 U/L (ref 11–59)

## 2011-02-24 MED ORDER — IOHEXOL 300 MG/ML  SOLN
100.0000 mL | Freq: Once | INTRAMUSCULAR | Status: AC | PRN
  Administered 2011-02-24: 100 mL via INTRAVENOUS

## 2011-02-25 ENCOUNTER — Inpatient Hospital Stay (HOSPITAL_COMMUNITY): Payer: PRIVATE HEALTH INSURANCE

## 2011-02-25 LAB — COMPREHENSIVE METABOLIC PANEL
ALT: 12 U/L (ref 0–53)
AST: 12 U/L (ref 0–37)
Albumin: 3.2 g/dL — ABNORMAL LOW (ref 3.5–5.2)
Alkaline Phosphatase: 82 U/L (ref 39–117)
BUN: 12 mg/dL (ref 6–23)
CO2: 26 mEq/L (ref 19–32)
Calcium: 9.1 mg/dL (ref 8.4–10.5)
Chloride: 107 mEq/L (ref 96–112)
Creatinine, Ser: 0.86 mg/dL (ref 0.4–1.5)
GFR calc Af Amer: >60 mL/min (ref >60)
GFR calc non Af Amer: >60 mL/min (ref >60)
Glucose, Bld: 145 mg/dL — ABNORMAL HIGH (ref 70–99)
Potassium: 4 mEq/L (ref 3.5–5.1)
Sodium: 142 mEq/L (ref 135–145)
Total Bilirubin: 0.3 mg/dL (ref 0.3–1.2)
Total Protein: 6.8 g/dL (ref 6.0–8.3)

## 2011-02-25 LAB — FECAL LACTOFERRIN, QUANT: Fecal Lactoferrin: POSITIVE

## 2011-02-25 LAB — CBC
HCT: 39 % (ref 39.0–52.0)
Hemoglobin: 13.5 g/dL (ref 13.0–17.0)
MCH: 29 pg (ref 26.0–34.0)
MCHC: 34.6 g/dL (ref 30.0–36.0)
MCV: 83.9 fL (ref 78.0–100.0)
Platelets: 209 10*3/uL (ref 150–400)
RBC: 4.65 MIL/uL (ref 4.22–5.81)
RDW: 12.8 % (ref 11.5–15.5)
WBC: 7 10*3/uL (ref 4.0–10.5)

## 2011-02-25 LAB — DIFFERENTIAL
Basophils Absolute: 0 10*3/uL (ref 0.0–0.1)
Basophils Relative: 0 % (ref 0–1)
Eosinophils Absolute: 0 10*3/uL (ref 0.0–0.7)
Eosinophils Relative: 0 % (ref 0–5)
Lymphocytes Relative: 14 % (ref 12–46)
Lymphs Abs: 1 10*3/uL (ref 0.7–4.0)
Monocytes Absolute: 0.8 10*3/uL (ref 0.1–1.0)
Monocytes Relative: 12 % (ref 3–12)
Neutro Abs: 5.2 10*3/uL (ref 1.7–7.7)
Neutrophils Relative %: 75 % (ref 43–77)

## 2011-02-25 LAB — OVA AND PARASITE EXAMINATION

## 2011-02-25 LAB — PHOSPHORUS: Phosphorus: 3.4 mg/dL (ref 2.3–4.6)

## 2011-02-25 LAB — LIPASE, BLOOD: Lipase: 14 U/L (ref 11–59)

## 2011-02-25 LAB — MAGNESIUM: Magnesium: 2.3 mg/dL (ref 1.5–2.5)

## 2011-02-27 LAB — STOOL CULTURE

## 2011-02-28 ENCOUNTER — Inpatient Hospital Stay (HOSPITAL_COMMUNITY)
Admission: EM | Admit: 2011-02-28 | Discharge: 2011-03-12 | DRG: 392 | Disposition: A | Discharge status: Home / Self Care | Payer: PRIVATE HEALTH INSURANCE | Attending: Internal Medicine | Admitting: Internal Medicine

## 2011-02-28 DIAGNOSIS — T451X5A Adverse effect of antineoplastic and immunosuppressive drugs, initial encounter: Secondary | ICD-10-CM | POA: Diagnosis present

## 2011-02-28 DIAGNOSIS — T380X5A Adverse effect of glucocorticoids and synthetic analogues, initial encounter: Secondary | ICD-10-CM | POA: Diagnosis not present

## 2011-02-28 DIAGNOSIS — B3781 Candidal esophagitis: Secondary | ICD-10-CM | POA: Diagnosis not present

## 2011-02-28 DIAGNOSIS — M7989 Other specified soft tissue disorders: Secondary | ICD-10-CM | POA: Diagnosis not present

## 2011-02-28 DIAGNOSIS — E871 Hypo-osmolality and hyponatremia: Secondary | ICD-10-CM | POA: Diagnosis present

## 2011-02-28 DIAGNOSIS — K649 Unspecified hemorrhoids: Secondary | ICD-10-CM | POA: Diagnosis present

## 2011-02-28 DIAGNOSIS — E876 Hypokalemia: Secondary | ICD-10-CM | POA: Diagnosis present

## 2011-02-28 DIAGNOSIS — F411 Generalized anxiety disorder: Secondary | ICD-10-CM | POA: Diagnosis not present

## 2011-02-28 DIAGNOSIS — K5289 Other specified noninfective gastroenteritis and colitis: Principal | ICD-10-CM | POA: Diagnosis present

## 2011-02-28 DIAGNOSIS — Z79899 Other long term (current) drug therapy: Secondary | ICD-10-CM

## 2011-02-28 DIAGNOSIS — C4360 Malignant melanoma of unspecified upper limb, including shoulder: Secondary | ICD-10-CM | POA: Diagnosis present

## 2011-02-28 DIAGNOSIS — C801 Malignant (primary) neoplasm, unspecified: Secondary | ICD-10-CM | POA: Diagnosis present

## 2011-02-28 LAB — URINALYSIS, ROUTINE W REFLEX MICROSCOPIC
Bilirubin Urine: NEGATIVE
Glucose, UA: >1000 mg/dL — AB
Hgb urine dipstick: NEGATIVE
Ketones, ur: NEGATIVE mg/dL
Leukocytes, UA: NEGATIVE
Nitrite: NEGATIVE
Protein, ur: NEGATIVE mg/dL
Specific Gravity, Urine: 1.038 — ABNORMAL HIGH (ref 1.005–1.030)
Urobilinogen, UA: 0.2 mg/dL (ref 0.0–1.0)
pH: 5 (ref 5.0–8.0)

## 2011-02-28 LAB — DIFFERENTIAL
Basophils Absolute: 0.1 10*3/uL (ref 0.0–0.1)
Basophils Relative: 1 % (ref 0–1)
Eosinophils Absolute: 0.6 10*3/uL (ref 0.0–0.7)
Eosinophils Relative: 5 % (ref 0–5)
Lymphocytes Relative: 11 % — ABNORMAL LOW (ref 12–46)
Lymphs Abs: 1.2 10*3/uL (ref 0.7–4.0)
Monocytes Absolute: 0.7 10*3/uL (ref 0.1–1.0)
Monocytes Relative: 6 % (ref 3–12)
Neutro Abs: 8.4 10*3/uL — ABNORMAL HIGH (ref 1.7–7.7)
Neutrophils Relative %: 77 % (ref 43–77)

## 2011-02-28 LAB — BASIC METABOLIC PANEL
BUN: 12 mg/dL (ref 6–23)
CO2: 24 mEq/L (ref 19–32)
Calcium: 8.8 mg/dL (ref 8.4–10.5)
Chloride: 100 mEq/L (ref 96–112)
Creatinine, Ser: 0.89 mg/dL (ref 0.4–1.5)
GFR calc Af Amer: >60 mL/min (ref >60)
GFR calc non Af Amer: >60 mL/min (ref >60)
Glucose, Bld: 139 mg/dL — ABNORMAL HIGH (ref 70–99)
Potassium: 3.3 mEq/L — ABNORMAL LOW (ref 3.5–5.1)
Sodium: 134 mEq/L — ABNORMAL LOW (ref 135–145)

## 2011-02-28 LAB — CBC
HCT: 42.9 % (ref 39.0–52.0)
Hemoglobin: 14.9 g/dL (ref 13.0–17.0)
MCH: 29.1 pg (ref 26.0–34.0)
MCHC: 34.7 g/dL (ref 30.0–36.0)
MCV: 83.8 fL (ref 78.0–100.0)
Platelets: 206 10*3/uL (ref 150–400)
RBC: 5.12 MIL/uL (ref 4.22–5.81)
RDW: 12.9 % (ref 11.5–15.5)
WBC: 11 10*3/uL — ABNORMAL HIGH (ref 4.0–10.5)

## 2011-02-28 LAB — URINE MICROSCOPIC-ADD ON

## 2011-03-01 LAB — DIFFERENTIAL
Basophils Absolute: 0 10*3/uL (ref 0.0–0.1)
Basophils Relative: 0 % (ref 0–1)
Eosinophils Absolute: 0 10*3/uL (ref 0.0–0.7)
Eosinophils Relative: 0 % (ref 0–5)
Lymphocytes Relative: 14 % (ref 12–46)
Lymphs Abs: 1.2 10*3/uL (ref 0.7–4.0)
Monocytes Absolute: 0.7 10*3/uL (ref 0.1–1.0)
Monocytes Relative: 8 % (ref 3–12)
Neutro Abs: 6.5 10*3/uL (ref 1.7–7.7)
Neutrophils Relative %: 78 % — ABNORMAL HIGH (ref 43–77)

## 2011-03-01 LAB — CBC
HCT: 39.7 % (ref 39.0–52.0)
Hemoglobin: 13.5 g/dL (ref 13.0–17.0)
MCH: 28.7 pg (ref 26.0–34.0)
MCHC: 34 g/dL (ref 30.0–36.0)
MCV: 84.5 fL (ref 78.0–100.0)
Platelets: 202 10*3/uL (ref 150–400)
RBC: 4.7 MIL/uL (ref 4.22–5.81)
RDW: 12.9 % (ref 11.5–15.5)
WBC: 8.4 10*3/uL (ref 4.0–10.5)

## 2011-03-01 LAB — COMPREHENSIVE METABOLIC PANEL
ALT: 14 U/L (ref 0–53)
AST: 11 U/L (ref 0–37)
Albumin: 2.6 g/dL — ABNORMAL LOW (ref 3.5–5.2)
Alkaline Phosphatase: 68 U/L (ref 39–117)
BUN: 13 mg/dL (ref 6–23)
CO2: 23 mEq/L (ref 19–32)
Calcium: 8.3 mg/dL — ABNORMAL LOW (ref 8.4–10.5)
Chloride: 104 mEq/L (ref 96–112)
Creatinine, Ser: 0.89 mg/dL (ref 0.4–1.5)
GFR calc Af Amer: >60 mL/min (ref >60)
GFR calc non Af Amer: >60 mL/min (ref >60)
Glucose, Bld: 177 mg/dL — ABNORMAL HIGH (ref 70–99)
Potassium: 3.9 mEq/L (ref 3.5–5.1)
Sodium: 135 mEq/L (ref 135–145)
Total Bilirubin: 0.3 mg/dL (ref 0.3–1.2)
Total Protein: 5.9 g/dL — ABNORMAL LOW (ref 6.0–8.3)

## 2011-03-01 LAB — MAGNESIUM: Magnesium: 2.4 mg/dL (ref 1.5–2.5)

## 2011-03-01 LAB — PHOSPHORUS: Phosphorus: 3.4 mg/dL (ref 2.3–4.6)

## 2011-03-01 LAB — CLOSTRIDIUM DIFFICILE BY PCR: Toxigenic C. Difficile by PCR: NEGATIVE

## 2011-03-01 LAB — TSH: TSH: 0.234 u[IU]/mL — ABNORMAL LOW (ref 0.350–4.500)

## 2011-03-02 DIAGNOSIS — K5289 Other specified noninfective gastroenteritis and colitis: Secondary | ICD-10-CM

## 2011-03-02 LAB — BASIC METABOLIC PANEL
BUN: 14 mg/dL (ref 6–23)
CO2: 25 mEq/L (ref 19–32)
Calcium: 9.1 mg/dL (ref 8.4–10.5)
Chloride: 103 mEq/L (ref 96–112)
Creatinine, Ser: 0.71 mg/dL (ref 0.4–1.5)
GFR calc Af Amer: >60 mL/min (ref >60)
GFR calc non Af Amer: >60 mL/min (ref >60)
Glucose, Bld: 142 mg/dL — ABNORMAL HIGH (ref 70–99)
Potassium: 4.5 mEq/L (ref 3.5–5.1)
Sodium: 135 mEq/L (ref 135–145)

## 2011-03-02 LAB — CBC
HCT: 42.3 % (ref 39.0–52.0)
Hemoglobin: 14.3 g/dL (ref 13.0–17.0)
MCH: 28.4 pg (ref 26.0–34.0)
MCHC: 33.8 g/dL (ref 30.0–36.0)
MCV: 84.1 fL (ref 78.0–100.0)
Platelets: 244 10*3/uL (ref 150–400)
RBC: 5.03 MIL/uL (ref 4.22–5.81)
RDW: 13.1 % (ref 11.5–15.5)
WBC: 8.1 10*3/uL (ref 4.0–10.5)

## 2011-03-02 LAB — URINE CULTURE
Colony Count: NO GROWTH
Culture  Setup Time: 201205080250
Culture: NO GROWTH
Special Requests: NEGATIVE

## 2011-03-03 DIAGNOSIS — K5289 Other specified noninfective gastroenteritis and colitis: Secondary | ICD-10-CM

## 2011-03-03 LAB — CBC
HCT: 41 % (ref 39.0–52.0)
Hemoglobin: 14 g/dL (ref 13.0–17.0)
MCH: 28.7 pg (ref 26.0–34.0)
MCHC: 34.1 g/dL (ref 30.0–36.0)
MCV: 84 fL (ref 78.0–100.0)
Platelets: 205 10*3/uL (ref 150–400)
RBC: 4.88 MIL/uL (ref 4.22–5.81)
RDW: 12.9 % (ref 11.5–15.5)
WBC: 7 10*3/uL (ref 4.0–10.5)

## 2011-03-03 LAB — COMPREHENSIVE METABOLIC PANEL
ALT: 13 U/L (ref 0–53)
AST: 9 U/L (ref 0–37)
Albumin: 2.9 g/dL — ABNORMAL LOW (ref 3.5–5.2)
Alkaline Phosphatase: 68 U/L (ref 39–117)
BUN: 16 mg/dL (ref 6–23)
CO2: 27 mEq/L (ref 19–32)
Calcium: 8.8 mg/dL (ref 8.4–10.5)
Chloride: 103 mEq/L (ref 96–112)
Creatinine, Ser: 0.81 mg/dL (ref 0.4–1.5)
GFR calc Af Amer: >60 mL/min (ref >60)
GFR calc non Af Amer: >60 mL/min (ref >60)
Glucose, Bld: 178 mg/dL — ABNORMAL HIGH (ref 70–99)
Potassium: 4.4 mEq/L (ref 3.5–5.1)
Sodium: 136 mEq/L (ref 135–145)
Total Bilirubin: 0.3 mg/dL (ref 0.3–1.2)
Total Protein: 5.8 g/dL — ABNORMAL LOW (ref 6.0–8.3)

## 2011-03-03 LAB — MAGNESIUM: Magnesium: 2.3 mg/dL (ref 1.5–2.5)

## 2011-03-04 LAB — BASIC METABOLIC PANEL
BUN: 18 mg/dL (ref 6–23)
CO2: 25 mEq/L (ref 19–32)
Calcium: 8.6 mg/dL (ref 8.4–10.5)
Chloride: 102 mEq/L (ref 96–112)
Creatinine, Ser: 0.69 mg/dL (ref 0.4–1.5)
GFR calc Af Amer: >60 mL/min (ref >60)
GFR calc non Af Amer: >60 mL/min (ref >60)
Glucose, Bld: 156 mg/dL — ABNORMAL HIGH (ref 70–99)
Potassium: 4 mEq/L (ref 3.5–5.1)
Sodium: 136 mEq/L (ref 135–145)

## 2011-03-04 NOTE — H&P (Signed)
Nathaniel Turner, Nathaniel Turner               ACCOUNT NO.:  [id number]  MEDICAL RECORD NO.:  [id number]           PATIENT TYPE:  I  LOCATION:  1512                         FACILITY:  [facility]  PHYSICIAN:  Kathlen Mody, MD       DATE OF BIRTH:  [birth year]  DATE OF ADMISSION:  02/23/2011 DATE OF DISCHARGE:                             HISTORY & PHYSICAL   PRIMARY CARE PHYSICIAN:  Dr. Clarene Duke.  ONCOLOGIST:  Lajuana Matte, M.D.  CHIEF COMPLAINT:  Watery diarrhea since 1 day.  HISTORY OF PRESENT ILLNESS:  This is a 50 year old gentleman with history of metastatic melanoma diagnosed in 2007 status post surgical resection and immunotherapy, currently on Yervoy, completed 4 cycles of treatment, last treatment was Thursday, left thigh cellulitis on 4 days of Keflex, came in complaining of watery diarrhea since 1 day associated with generalized nonradiating abdominal pain, not associated with any nausea or vomiting or sick contacts.  No history of fevers.  Occasional blood mixed with stools.  Has a history of hemorrhoids.  The patient has history of colonoscopy done the last 5 years which showed benign polyp and hemorrhoids.  As per Dr. Shirline Frees, oncologist, and as per the patient, the current treatment drug Arthur Holms has a tendency for colitis and bowel perforation and Dr. Shirline Frees send the patient to ER for further evaluation and management.  The patient denies any other complaints like chest pain, shortness of breath, palpitations, nausea, vomiting, headaches, blurry vision, etc.  The patient had left thigh cellulitis diagnosed with CT of the left thigh on February 18, 2011, and he was given a prescription for Keflex.  The patient reports using Keflex for 4 days, the last dose was on Monday.  As the patient started having diarrhea, he stopped using Keflex.  REVIEW OF SYSTEMS:  See HPI, otherwise negative.  PAST MEDICAL HISTORY: 1. Metastatic melanoma diagnosed in 2007. 2. Status post surgical  resection on the left shoulder in 2008. 3. Status post axillary node dissection in 2008. 4. Left thigh cellulitis.  ALLERGIES:  The patient is allergic to OXYCODONE  SOCIAL HISTORY:  The patient is married, works as a Production designer, theatre/television/film in [employer]. He is an ex-smoker, ex-EtOH.  Denies any drug abuse.  HOME MEDICATIONS: 1. Lorazepam 1 mg at bedtime as needed. 2. Ambien 5 mg at bedtime as needed. 3. Hydrocodone 1 to 2 tablets q.6 h p.r.n. 4. Keflex 500 mg q.6 h  PHYSICAL EXAMINATION:  VITAL SIGNS:  Temperature of 97.9, pulse of 85, respirations 18, blood pressure 123/82, saturating 97% on room air. GENERAL:  On exam, he is alert, afebrile, oriented x3, comfortable, in no acute distress. HEENT EXAM:  Pupils equal and reacting to light.  Moist mucous membranes.  No JVD.  No scleral icterus. CARDIOVASCULAR EXAM:  S1, S2 heard.  No rubs, murmurs or gallops. Regular rate and rhythm. RESPIRATORY EXAM:  Good air entry bilateral.  No wheezing or rhonchi. ABDOMEN:  Soft, nondistended.  Mild generalized tenderness.  Bowel sounds are heard. EXTREMITIES:  No pedal edema.  Left thigh cellulitis has improved.  LABORATORY DATA:  CBC within normal limits.  Comprehensive metabolic panel  within normal limits.  RADIOLOGY:  Acute abdominal series, no acute abnormalities.  ASSESSMENT AND PLAN:  This is a 50 year old gentleman with history of metastatic melanoma, on treatment drug Yervoy, last treatment was on Thursday, on recent antibiotic use for cellulitis, came in complaining of watery diarrhea for about a day, being admitted to med surg floor to rule out C diff associated diarrhea versus antibiotic associated diarrhea versus diarrhea secondary to the treatment medication for a melanoma.  We will start the patient on some clear liquid diet.  We will send his stool for C diff and stool for fecal WBCs and advance diet as tolerated. We will start the patient on IV fluids normal saline at 100 cc/hour.   We will get a CT abdomen and pelvis with contrast to rule out colitis.  The patient was already on prednisone at home, was advised to take prednisone if the patient has diarrhea.  He already took 100 mg yesterday.  The patient got a dose of IV methylprednisone 125 mcg in the ER.  We will continue the IV steroids for one more day with IV Solu- Medrol 40 mg q.8 h starting from tomorrow.  Further management as per the rounding MD.  DVT prophylaxis, subcu Lovenox.  GI prophylaxis, we will hold the Protonix for now until C diff is ruled out.          ______________________________ Kathlen Mody, MD     VA/MEDQ  D:  02/23/2011  T:  02/23/2011  Job:  [id number]  Electronically Signed by Kathlen Mody MD on 03/04/2011 07:00:47 PM

## 2011-03-06 ENCOUNTER — Inpatient Hospital Stay (HOSPITAL_COMMUNITY): Payer: PRIVATE HEALTH INSURANCE

## 2011-03-06 LAB — BASIC METABOLIC PANEL
BUN: 29 mg/dL — ABNORMAL HIGH (ref 6–23)
CO2: 30 mEq/L (ref 19–32)
Calcium: 9.6 mg/dL (ref 8.4–10.5)
Chloride: 98 mEq/L (ref 96–112)
Creatinine, Ser: 0.85 mg/dL (ref 0.4–1.5)
GFR calc Af Amer: >60 mL/min (ref >60)
GFR calc non Af Amer: >60 mL/min (ref >60)
Glucose, Bld: 148 mg/dL — ABNORMAL HIGH (ref 70–99)
Potassium: 4.1 mEq/L (ref 3.5–5.1)
Sodium: 136 mEq/L (ref 135–145)

## 2011-03-06 LAB — CBC
HCT: 45 % (ref 39.0–52.0)
Hemoglobin: 16 g/dL (ref 13.0–17.0)
MCH: 29.3 pg (ref 26.0–34.0)
MCHC: 35.6 g/dL (ref 30.0–36.0)
MCV: 82.4 fL (ref 78.0–100.0)
Platelets: 231 10*3/uL (ref 150–400)
RBC: 5.46 MIL/uL (ref 4.22–5.81)
RDW: 13.1 % (ref 11.5–15.5)
WBC: 11.3 10*3/uL — ABNORMAL HIGH (ref 4.0–10.5)

## 2011-03-06 LAB — TSH: TSH: 0.236 u[IU]/mL — ABNORMAL LOW (ref 0.350–4.500)

## 2011-03-06 NOTE — Discharge Summary (Signed)
Nathaniel Turner, Nathaniel Turner               ACCOUNT NO.:  [id number]  MEDICAL RECORD NO.:  [id number]           PATIENT TYPE:  I  LOCATION:  1512                         [facility]ACILITY[facility]  [facility]  PHYSICIAN:  Rock Nephew, MD       DATE OF BIRTH:  1961-04-17  DATE OF ADMISSION:  02/23/2011 DATE OF DISCHARGE:  02/25/2011                        DISCHARGE SUMMARY - REFERRING   PRIMARY CARE PHYSICIAN:  Caryn Bee L. Little, M.D.  PRIMARY ONCOLOGIST:  Lajuana Matte, M.D.  DISCHARGE DIAGNOSES:  The patient's discharge diagnosis are as follows: 1. Right-sided colitis, possibly infectious or more likely related to     [facility] treatment. 2. History of  metastatic melanoma. 3. Diarrhea from colitis. 4. Left thigh cellulitis, resolved. 5. Status post surgical resection of the left shoulder. 6. Status post left axillary node dissection in 2008.  DISCHARGE MEDICATIONS:  Discharge medications for the patient are as follows: 1. Ciprofloxacin 500 mg p.o. twice daily for 7 days. 2. Metronidazole 500 mg p.o. t.i.d. for 7 days. 3. Zofran 4 mg by mouth every 6 hours as needed. 4. Prednisone taper 60 mg for 2 days, 50 mg for 2 days, 40 mg for 2     days, 30 mg for 2 days, 20 mg for 2 days, 10 mg for 2 days and then     stopping. 5. Lorazepam 1 mg 1 tablet by mouth daily as needed for anxiety. 6. Zolpidem 5 mg 1 tablet by mouth daily at bedtime as needed.  DISPOSITION:  The patient is discharged home.  DIET:  The patient's diet is regular.  PROCEDURES:  The patient's procedures performed:  The patient had an acute abdominal series on Feb 23, 2011, which showed no acute abnormalities, scoliosis.  The patient's CT scan of the abdomen and pelvis on Feb 24, 2011, showed: 1. Right-sided colitis, possible sigmoid colitis.  Given the     distribution, favor infection.  Exclude C diff clinically.     Although the patient is on chemotherapy, normal white cell count on     Feb 24, 2011, argue against typhlitis,  neutropenic colitis. 2. Slight enlargement of the left pelvic sidewall lymph nodes.  The     patient was recently diagnosed with left lower extremity     cellulitis.  These, therefore, may be reactive.  Imaging followup     is recommended to exclude nodal metastasis. 3. Decrease size of soft tissue density at the left lung left lung     base.  Failure to represent either resolving infection or rounded     atelectasis scarring.  Isolated metastasis with interval response     to chemotherapy is felt less likely. 4. Right renal lesion is similar in size unlikely a     hemorrhagic/proteinaceous.  This could be reevaluated at followup     to exclude a solid neoplasm.  The patient's two-view abdominal x-ray on Feb 25, 2011 showed nonspecific air fluid levels within the right upper quadrant, likely related diarrhea.  The  colitis described on yesterday's CT is not readily apparent.  No complication identified.  CONSULTATIONS:  Consultations on this case with Dr. Arbutus Ped  Mohamed. Again follow up with Dr. Clarene Duke within 1 week, Dr. Arbutus Ped within 1 week. BRIEF HISTORY OF PRESENT ILLNESS:  Chief complaint is watery diarrhea since one day.  This is a 50 year old gentleman with a history of metastatic melanoma diagnosed in 2007 status post surgical resection and immunotherapy, currently on Yervoy.  The patient has completed 4 cycles of treatment, last treatment was Thursday.  The patient also developed left thigh cellulitis and he was treated with Keflex.  The patient came in complaining of watery diarrhea.  HOSPITAL COURSE: 1. Colitis.  The patient was discovered to have colitis on the CT     scan.  Dr. Si Gaul has been seeing the patient in     consultation.  He thought it was either C diff colitis or colitis     secondary to [facility] treatment.  The patient's C diff PCR was     negative.  The patient's fecal lactoferrin is positive.  Stool     culture and stool ova and parasites are  still pending right now.     The patient has normal LFTs.  The patient's abdominal pain has     resolved.  The patient also was treated with steroids, Solu-Medrol     for Yervoy induced colitis.  I will discharge the patient home     today on ciprofloxacin metronidazole for 7 days.  I will also send     the patient on prednisone taper for 12 days.  The patient should     have outpatient followup with Dr. Si Gaul. 2. History of metastatic melanoma.  The patient has a history of     metastatic melanoma and the patient should continue followup with     Dr. Si Gaul for further treatment.  No intervention for     metastatic melanoma was done during the hospitalization. 3. Diarrhea.  The patient has diarrhea.  This is again related to     colitis and we are treating the patient empirically with     antibiotics. 4. Left thigh cellulitis.  The patient has a history of left thigh     cellulitis.  The patient received 4 days of Keflex as an     outpatient.  Cellulitis has resolved. 5. For DVT prophylaxis, the patient received Lovenox for DVT     prophylaxis.     Rock Nephew, MD     NH/MEDQ  D:  02/25/2011  T:  02/25/2011  Job:  [id number]  cc:   Caryn Bee L. Little, M.D. Fax: [phone]  Lajuana Matte, M.D. Fax: [phone]  Electronically Signed by Rock Nephew MD on 03/06/2011 01:57:22 PM

## 2011-03-06 NOTE — H&P (Signed)
Nathaniel Turner, Nathaniel Turner               ACCOUNT NO.:  [id number]  MEDICAL RECORD NO.:  [id number]           PATIENT TYPE:  E  LOCATION:  WLED                         FACILITY:  [facility]  PHYSICIAN:  Rock Nephew, MD       DATE OF BIRTH:  February 17, 1961  DATE OF ADMISSION:  02/28/2011 DATE OF DISCHARGE:                             HISTORY & PHYSICAL   PRIMARY CARE PHYSICIAN:  Catha Gosselin, MD  PRIMARY ONCOLOGIST:  Velora Heckler. Arbutus Ped, MD  CHIEF COMPLAINT:  Diarrhea, persistent.  HISTORY OF PRESENT ILLNESS:  This is a 50 year old patient who is known to me through a previous admission.  The patient was recently in the hospital from Feb 23, 2011, to Feb 25, 2011, in which the patient came in secondary to abdominal pain, was found to have colitis and was having diarrhea.  This was thought secondary to [facility] treatment.  The patient has a history of metastatic melanoma and was recently treated with [facility].  The patient was discharged from the hospital on Feb 25, 2011. The patient reports that he is still having persistent diarrhea with multiple bowel movements, watery, daily.  There is no blood in the bowel movements.  The patient has been able to eat and can keep food down. However, he is afraid that the more he eats, the more diarrhea he is going to have.  The patient came back to the hospital.  The patient initially was mildly tachycardic at 100 beats per minute.  The patient's lab has some mild leukocytosis at 11.0, otherwise the patient's BUN and creatinine ratio was pretty much the same as when he left, 12/0.89.  The patient's sugars have been slightly elevated secondary to Solu-Medrol. Otherwise, the patient denies any abdominal pain.  He does report some abdominal discomfort during bowel movements.  He denies any chest pain, any shortness of breath.  He denies any fevers or chills.  PAST MEDICAL HISTORY: 1. History of right-sided colitis secondary thought to be to [facility]      treatment. 2. Hemorrhoids. 3. History of metastatic melanoma. 4. History of left thigh cellulitis.  SOCIAL HISTORY:  Nonsmoker, nondrinker.  No drug abuse.  He does not need any help with his activities of daily living.  ALLERGIES:  OXYCODONE, which causes itching or pruritus.  HOME MEDICATIONS: 1. Lorazepam 1 mg every 8 hours as needed for anxiety. 2. Zofran 4 mg by mouth every 6 hours as needed for nausea or     vomiting. 3. Zolpidem 5 mg p.o. at bedtime as needed for insomnia. 4. Metronidazole 500 mg p.o. t.i.d., started Feb 25, 2011, to take for     7 days. 5. Ciprofloxacin 500 mg p.o. twice daily, started Feb 25, 2011, to take     for 7 days. 6. The patient was on prednisone taper where he was taking 60 mg for 2     days, 50 mg for 2 days, 40 mg for 2 days, 30 mg for 2 days, then 20     mg for 2 days, and 10 mg for 2 days and then stopping.  REVIEW OF SYSTEMS:  He denies  any headaches, any blurry vision.  He denies any chest pain, any shortness of breath.  He denies any nausea, vomiting.  He reports some mild abdominal discomfort with bowel movements.  He obviously reports watery diarrhea.  No burning on urination.  No pain in his legs.  PHYSICAL EXAMINATION:  VITAL SIGNS:  Temperature 99.2, blood pressure 117/77, pulse rate has ranged from 100 to 85, respiratory rate 16, 93% saturation on room air.  HEENT:  Normocephalic, atraumatic.  Pupils equally round, reactive to light.  CARDIOVASCULAR:  S1, S2. Regular rate and rhythm.  No murmurs or rubs.  LUNGS:  Clear to auscultation bilaterally.  No wheezes or rhonchi.  ABDOMEN:  Soft, nontender, nondistended.  Bowel sounds are positive.  No guarding or rebound tenderness.  EXTREMITIES:  No lower extremity edema evident. NEUROLOGIC:  He is alert, awake, oriented x3.  LABORATORY DATA:  The patient has had no radiological studies during this admission.  WBC count 11.0, hemoglobin is 14.9, hematocrit is 42.9, MCV is 83.8, platelets  206, neutrophils 77.  Sodium is 134, potassium is 3.3, chloride is 100, bicarbonate is 24, BUN is 12, creatinine 0.89, glucose 139, calcium is 8.8.  IMPRESSION AND PLAN:  This is a 50 year old male with a history of metastatic melanoma and recently admitted for colitis, right-sided, secondary to [facility] treatment: 1. Diarrhea and colitis:  This is, again, most likely related to     [facility] treatment.  The patient has already been seen by Dr.     Arbutus Ped. Currently, I discussed the case with Dr. Arbutus Ped and we     have agreed to stop the antibiotics.  Also of note, the patient     during the previous admission had a Clostridium difficile PCR that     was negative.  The patient also had a stool ova and parasites that     were negative and stool culture was negative at that time also.  I     will not pursue infectious workup at this time.  The patient will     be placed on IV fluids, normal saline 100 mL/hour, and 20 mEq KCl     at 100 mL/hour.  The patient will be admitted under observation. 2. History of metastatic melanoma:  This is stable and the patient     will have further treatment by Dr. Arbutus Ped. 3. History of left thigh cellulitis:  This has resolved. 4. Deep venous thrombosis prophylaxis.  The patient will be placed on     Lovenox 40 mg subcu daily. 5. Hypokalemia:  The patient's potassium will be repleted.  CODE STATUS:  The code status was discussed with the patient.  The patient is a Full Code.     Rock Nephew, MD     NH/MEDQ  D:  02/28/2011  T:  02/28/2011  Job:  [id number]  cc:   Caryn Bee L. Little, M.D. Fax: [phone]  Electronically Signed by Rock Nephew MD on 03/06/2011 01:57:35 PM

## 2011-03-07 ENCOUNTER — Other Ambulatory Visit: Payer: Self-pay | Admitting: Gastroenterology

## 2011-03-07 DIAGNOSIS — R933 Abnormal findings on diagnostic imaging of other parts of digestive tract: Secondary | ICD-10-CM

## 2011-03-07 DIAGNOSIS — K5289 Other specified noninfective gastroenteritis and colitis: Secondary | ICD-10-CM

## 2011-03-07 DIAGNOSIS — R197 Diarrhea, unspecified: Secondary | ICD-10-CM

## 2011-03-07 LAB — CBC
HCT: 42.2 % (ref 39.0–52.0)
Hemoglobin: 14.9 g/dL (ref 13.0–17.0)
MCH: 29.1 pg (ref 26.0–34.0)
MCHC: 35.3 g/dL (ref 30.0–36.0)
MCV: 82.4 fL (ref 78.0–100.0)
Platelets: 189 10*3/uL (ref 150–400)
RBC: 5.12 MIL/uL (ref 4.22–5.81)
RDW: 13.1 % (ref 11.5–15.5)
WBC: 9.9 10*3/uL (ref 4.0–10.5)

## 2011-03-07 LAB — BASIC METABOLIC PANEL
BUN: 23 mg/dL (ref 6–23)
CO2: 28 mEq/L (ref 19–32)
Calcium: 7.8 mg/dL — ABNORMAL LOW (ref 8.4–10.5)
Chloride: 98 mEq/L (ref 96–112)
Creatinine, Ser: 0.72 mg/dL (ref 0.4–1.5)
GFR calc Af Amer: >60 mL/min (ref >60)
GFR calc non Af Amer: >60 mL/min (ref >60)
Glucose, Bld: 169 mg/dL — ABNORMAL HIGH (ref 70–99)
Potassium: 4.5 mEq/L (ref 3.5–5.1)
Sodium: 133 mEq/L — ABNORMAL LOW (ref 135–145)

## 2011-03-07 LAB — T4, FREE: Free T4: 1.56 ng/dL (ref 0.80–1.80)

## 2011-03-07 LAB — T3: T3, Total: 52.7 ng/dl — ABNORMAL LOW (ref 80.0–204.0)

## 2011-03-08 DIAGNOSIS — K5289 Other specified noninfective gastroenteritis and colitis: Secondary | ICD-10-CM

## 2011-03-08 LAB — BASIC METABOLIC PANEL
BUN: 19 mg/dL (ref 6–23)
CO2: 28 mEq/L (ref 19–32)
Calcium: 8.1 mg/dL — ABNORMAL LOW (ref 8.4–10.5)
Chloride: 95 mEq/L — ABNORMAL LOW (ref 96–112)
Creatinine, Ser: 0.78 mg/dL (ref 0.4–1.5)
GFR calc Af Amer: >60 mL/min (ref >60)
GFR calc non Af Amer: >60 mL/min (ref >60)
Glucose, Bld: 199 mg/dL — ABNORMAL HIGH (ref 70–99)
Potassium: 4.4 mEq/L (ref 3.5–5.1)
Sodium: 133 mEq/L — ABNORMAL LOW (ref 135–145)

## 2011-03-08 LAB — CBC
HCT: 43.5 % (ref 39.0–52.0)
Hemoglobin: 14.9 g/dL (ref 13.0–17.0)
MCH: 28.7 pg (ref 26.0–34.0)
MCHC: 34.3 g/dL (ref 30.0–36.0)
MCV: 83.7 fL (ref 78.0–100.0)
Platelets: 214 10*3/uL (ref 150–400)
RBC: 5.2 MIL/uL (ref 4.22–5.81)
RDW: 13.1 % (ref 11.5–15.5)
WBC: 9.4 10*3/uL (ref 4.0–10.5)

## 2011-03-08 NOTE — Assessment & Plan Note (Signed)
[facility]                         GASTROENTEROLOGY OFFICE NOTE   NAME:Turner, Nathaniel                        MRN:          [id number]  DATE:08/06/2007                            DOB:          [birth year]    Nathaniel Turner is a 50 year old white male with a history of symptomatic  grade 2 hemorrhoids which were treated by me in 1991, with __________  treatment.  He did well for almost 20 years but has had some recurrence  due to constipation.  He has bleeding, pain especially with straining.  He treats his hemorrhoids with sitz bath.  He has not taken any  suppositories lately.  He was recently diagnosed with stage IV melanoma  of the back, above the left scapula, this was excised.  And, he has been  treated with immunotherapy at [facility] with an investigational protocol.  He  was just found to have a recurrent lymph node in the left axilla which  will have to be excised in the next several weeks.  Otherwise, he is  doing well.  His weight has been stable.  He has no digestive symptoms  other than constipation.  He has tried stool softeners but without much  improvement.   MEDICATIONS:  Antidepressant, no other medications.   PAST HISTORY:  Melanoma.   OPERATIONS:  Excision of melanoma.   FAMILY HISTORY:  Negative for colon cancer.   SOCIAL HISTORY:  Married with 1 child.  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  He does not smoke.  He drinks alcohol socially.   REVIEW OF SYSTEMS:  Negative for any GI, musculoskeletal, or  genitourinary, or cardiovascular symptoms.   PHYSICAL EXAMINATION:  VITAL SIGNS:  Blood pressure 140/80, pulse 80,  and weight 213 pounds.  GENERAL:  He was alert and oriented in no distress.  SKIN:  Showed a large circular scar at the left scapula from excision of  the melanoma.  He had also a hard, nontender, non-mobile  lymph nodes on  the left axilla.  The right axilla was normal.  Supraclavicular area was  normal.  LUNGS:  Clear to auscultation.  COR:  Normal S1, normal S2.  ABDOMEN:  Soft nontender with normoactive bowel sounds.  RECTAL:  Shows no external hemorrhoids.  Rectal tone was normal.  There  was no palpable mass.  There was a large amount of stool in the rectal  ampulla which was heme positive.   IMPRESSION:  A 50 year old white male with heme positive stool and  symptomatic hemorrhoids.   We have seen him almost 50 years ago for the same problem of  constipation and rectal bleeding.  Because of the recent diagnosis of  melanoma, he will need a complete evaluation for his heme positive  stool.   PLAN:  1. Colonoscopy schedule.  2. Anusol HC suppositories.  3. Sitz baths.  4. High fiber diet.  5. Use MiraLax p.r.n. constipation.     Hedwig Morton. Juanda Chance, MD  Electronically Signed    DMB/MedQ  DD: 08/06/2007  DT: 08/06/2007  Job #: [id number]   cc:  Dr. Earlene Plater, dermatologist

## 2011-03-09 DIAGNOSIS — K5289 Other specified noninfective gastroenteritis and colitis: Secondary | ICD-10-CM

## 2011-03-09 LAB — HEPATITIS B SURFACE ANTIBODY,QUALITATIVE: Hep B S Ab: NEGATIVE

## 2011-03-09 LAB — HEPATITIS B SURFACE ANTIGEN: Hepatitis B Surface Ag: NEGATIVE

## 2011-03-09 LAB — HEPATITIS B CORE ANTIBODY, TOTAL: Hep B Core Total Ab: NEGATIVE

## 2011-03-10 DIAGNOSIS — K5289 Other specified noninfective gastroenteritis and colitis: Secondary | ICD-10-CM

## 2011-03-10 LAB — HCV RNA QUANT

## 2011-03-10 LAB — ALBUMIN: Albumin: 2.8 g/dL — ABNORMAL LOW (ref 3.5–5.2)

## 2011-03-10 NOTE — Group Therapy Note (Signed)
Nathaniel Turner, Nathaniel Turner               ACCOUNT NO.:  [id number]  MEDICAL RECORD NO.:  [id number]           PATIENT TYPE:  I  LOCATION:  1318                         FACILITY:  [facility]  PHYSICIAN:  Thad Ranger, MD       DATE OF BIRTH:  November 13, 1960                                PROGRESS NOTE   PRIMARY CARE PHYSICIAN: Caryn Bee L. Little, M.D.  PRIMARY ONCOLOGIST: Lajuana Matte, M.D.  CURRENT DIAGNOSES: 1. Acute severe colitis secondary to the [facility] treatment. 2. Dehydration. 3. Hyponatremia. 4. Metastatic melanoma.  CONSULTATIONS: 1. Gastroenterology, Venita Lick. Russella Dar, MD, Clementeen Graham. 2. Oncology, Lajuana Matte, M.D.  BRIEF HISTORY OF PRESENT ILLNESS: At the time of admission, Nathaniel Turner is a 50 year old male, who was recently discharged on Feb 25, 2011, in which, patient had come in for abdominal pain and was found to have colitis.  Patient has a history of metastatic melanoma and was recently treated with [facility].  Patient reported that he was still having persistent diarrhea with multiple bowel movements daily, but no blood in the bowel movements.  Patient was able to eat and keep food down.  For details, please refer to the admission note dictated by Dr. Rock Nephew on Feb 28, 2011.  PROCEDURES AND RADIOLOGICAL DATA: Colonoscopy on Mar 07, 2011, severe colitis in transverse colon to the rectum.  Pathology pending.  HOSPITALIZATION COURSE: Nathaniel Turner is a 50 year old male who was admitted with colitis and diarrhea secondary to [facility] treatment. 1. Diarrhea with colitis:  Patient was admitted to medical service.     Patient was also followed by Oncology Service briefly.     Gastroenterology was consulted.  Stool studies were done, which     were essentially negative.  Patient was placed on IV fluids and     clear liquid diet.  Patient continued to have frequent bowel     movements and was placed on high dose IV steroids by     gastroenterology.  On Mar 07, 2011, patient  underwent colonoscopy,     which showed severe colitis in the transverse colon to the rectum.     Per the GI recommendations today, patient will be continued on     steroids.  He has been on IV steroids for last 6 days and was also     placed on Entocort by Dr. Loreta Ave for the last 2 days.  Patient has     some improvement in frequency in diarrhea.  Per GI recommendation,     patient will have the diet advanced .  Also, preparations are being     made for Remicade infusion should it become necessary if no     improvement with the steroids.  PPD has been placed. 2. Metastatic melanoma, per oncology service:  Patient is currently     not on any treatment for metastatic melanoma inpatient. 3. Hypokalemia:  This was replaced during the hospitalization.  PHYSICAL EXAMINATION: VITAL SIGNS:  At the time of dictation, temperature 97.9, pulse 70, respirations 16, blood pressure 121/72, saturation 96% on room air. GENERAL:  Patient is alert, awake and oriented x3, not  in acute distress. HEENT:  Anicteric.  Conjunctivae pink.  Pupils reactive to light and accommodation.  EOMI. NECK:  Supple.  No nephropathy.  No JVD. CVS:  S1, S2 clear. CHEST:  Clear to auscultation bilaterally. ABDOMEN:  Nondistended.  Normal bowel sounds.  Soft. EXTREMITIES:  No cyanosis, clubbing or edema noted in upper or lower extremities bilaterally. NEUROLOGIC:  No focal neurological deficits noted.     Thad Ranger, MD     RR/MEDQ  D:  03/08/2011  T:  03/08/2011  Job:  [id number]  cc:   Venita Lick. Russella Dar, MD, FACG [address] [place] [address]  Anna Genre. Little, M.D. Fax: [phone]  Lajuana Matte, M.D. Fax: [phone]  Electronically Signed by Natina Wiginton  on 03/10/2011 02:02:31 PM

## 2011-03-11 NOTE — H&P (Signed)
NAMEGILMAN, OLAZABAL               ACCOUNT NO.:  [id number]   MEDICAL RECORD NO.:  [id number]          PATIENT TYPE:  INP   LOCATION:  1438                         FACILITY:  [facility]   PHYSICIAN:  Rollene Rotunda, MD, FACCDATE OF BIRTH:  12-21-1960   DATE OF ADMISSION:  11/05/2006  DATE OF DISCHARGE:                              HISTORY & PHYSICAL   PRIMARY:  None.   REASON FOR PRESENTATION:  Evaluate the patient with chest pain.   HISTORY OF PRESENT ILLNESS:  The patient is a very pleasant 50 year old  gentleman without prior cardiac history other than palpitations for many  years.  He did a little elliptical work this morning and then went for a  walk with his wife.  This was a mild level of exercise.  He did well  with this, though he had some pain his back on the right side.  He  stopped walking, he came inside and he was doing well.  He got ready and  went to church.  On the car ride, he developed a severe right-sided  discomfort.  He had not pain similar to this.  It was not similar to  previous angina.  It was 10/10 in intensity.  It radiated across under  his sternal area, slightly to the left side.  It was a heavy discomfort.  There was no radiation to his neck or to his arms.  There was no  associated nausea, vomiting, diaphoresis.  He denied any palpitations,  presyncope or syncope.  The pain was unrelenting.  He did present to the  emergency room.  He had no acute EKG changes.  He did receive some  morphine and the symptoms slowly resolved with the whole episode lasting  about an hour to an hour a half.  Currently, he is having some mild  residual discomfort, recurring.  This is otherwise relatively active,  though he has just started this exercise regimen.  With this exercise,  he has not had any chest discomfort, neck discomfort, arm discomfort,  activity-induced nausea, vomiting, or diaphoresis.  He has not had any  palpitations, presyncope or syncope.   PAST MEDICAL  HISTORY:  The patient was recently diagnosed with a  melanoma on his left shoulder.  He has positive lymph node in his left  axilla.  He is due to have resection.  He denies any hypertension,  diabetes or hyperlipidemia.   PAST SURGICAL HISTORY:  Sinus surgery.   ALLERGIES:  None.   MEDICATIONS:  None.   SOCIAL HISTORY:  The patient quit smoking one year ago.  He was just a  social smoker.  He socially drinks alcohol.  He has been married for 15  years and has no children.  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   FAMILY HISTORY:  His father committed suicide when the patient was a  baby.  He does not know his father's family history.  There is no early  onset heart disease on his maternal side, in first degree relatives.  His grandparents apparently had heart disease at later ages.   REVIEW  OF SYSTEMS:  As stated in the HPI.  Negative for all other  systems.   PHYSICAL EXAMINATION:  The patient is in no distress.  Blood pressure is  122/85, heart rate 62 and regular, respiratory rate 17.  HEENT:  Eyes unremarkable.  Pupils are equal, round, and reactive to  light.  Fundi not visualized.  Oral mucosa unremarkable.  NECK:  No jugular venous distention to 45 degrees.  Carotid upstroke  brisk and symmetric.  No bruits, no thyromegaly.  LYMPHATICS:  No cervical, axillary or inguinal adenopathy.  LUNGS:  Clear to auscultation bilaterally.  BACK:  No costovertebral angle tenderness.  HEART:  PMI not displaced or sustained.  S1-S2 within normal limits.  No  S3, no S4, no clicks, no rubs, no murmurs.  ABDOMEN:  Flat.  Positive bowel sounds.  Dull in frequency and pitch.  No bruits, rebound, guarding, or midline pulsatile masses.  No  hepatomegaly or splenomegaly.  SKIN:  No rashes.  The shoulder does have a 2 x 2 cm raised nonfluctuant  nodule.  NEUROLOGICAL:  Oriented to person, place and time.  Cranial nerves II  through XII grossly intact.  Motor grossly intact  throughout.   EKG sinus rhythm, rate 71, axis within normal limits, intervals within  normal limits, poor anterior R-wave progression, no acute ST-T wave  changes.   LABS:  WBC 6.6, hemoglobin 16.5, platelets 275,000.  Point of care  markers x2 negative.  D-dimer less than 0.22 and negative.  Chest x-ray  pending.   ASSESSMENT/PLAN:  1. The patient has had chest discomfort with some worrisome features.      He has not objective evidence of ischemia.  I think the pretest      probability of obstructive coronary disease is low moderate.  At      this point, I would like to observe him with a 24-hour observation      and cycle cardiac enzymes.  He will be evaluated further and      treated as needed if he has any recurrent symptoms.  If his enzymes      are normal and he is painfree in the a.m., I would suggest an      outpatient stress perfusion study.  I discussed this at length with      the patient and his wife.  They understand the strategy and agree      to proceed.  2. Risk reduction:  I will check a lipid profile.      Rollene Rotunda, MD, [facility]  Electronically Signed     JH/MEDQ  D:  11/05/2006  T:  11/05/2006  Job:  [id number]

## 2011-03-11 NOTE — Discharge Summary (Signed)
Nathaniel Turner, Nathaniel Turner                           ACCOUNT NO.:  [id number]   MEDICAL RECORD NO.:  [id number]                   PATIENT TYPE:  IPS   LOCATION:  0301                                 FACILITY[facility]  [facility]   PHYSICIAN:  Carolanne Grumbling, M.D.                 DATE OF BIRTH:  [birth year]   DATE OF ADMISSION:  10/20/2002  DATE OF DISCHARGE:  10/23/2002                                 DISCHARGE SUMMARY   IDENTIFYING INFORMATION:  The patient was a 50 year old man.   INITIAL ASSESSMENT AND DIAGNOSIS:  The patient said he had been depressed  since his wife of 11 years moved out about three months ago.  He said he had  become overwhelmed when he could not reach her and thought she was out with  someone else.  He consequently got depressed and was having suicidal  thoughts and took and overdose of clonazepam.   MENTAL STATUS EXAM:  Mental status at the time of the initial evaluation  revealed an alert and oriented man whose affect was labile.  He has anxious  and sad.  Speech was within normal limits.  There was no evidence of any  psychotic thinking.  Thoughts were logical and coherent.  Judgment and  insight seemed fair.  Concentration was poor.  He seemed to be at least  average intelligence.   PHYSICAL EXAMINATION:  Physical examination was essentially within normal  limits.   ADMISSION DIAGNOSES:   AXIS I:  Adjustment disorder with mixed emotional disturbance.   AXIS II:  Rule out dependent personality.   AXIS III:  No diagnosis.   AXIS IV:  Moderate.   AXIS V:  50/85   FINDINGS:  All indicated laboratory examinations were within normal limits  or noncontributory.   HOSPITAL COURSE:  While in the hospital, the patient was cooperative.  He  was talkative.  He attended groups.  He indicated that he continued to be  upset about his wife leaving.  He said that they really had not ever been  compatible in a sense because she was very outgoing and socially oriented  whereas he  was more introverted and liked to be at home doing home projects  and home things, so they found it hard to meet in the middle.  He said he  could enjoy some of the things she enjoyed but to him, they were more a  burden than a pleasure after a certain point and the same was true for her  in his direction.  Consequently, they had moved apart.  Nevertheless, when  the time came for her to leave, he was upset because he planned to stay  married the rest of his life and she seemed the right person for him.  He  admitted that he had overreacted.  He said he was not the kind of person who  took an overdose.  He normally talked  things out, worked things through,  accepted what he had to accept, and then got on with it.  But, he said he  got a bit confused because he had spent time with his wife, even though they  are currently living apart, and she seemed to be receptive to him and at  least available as a friend but then the very next day, he had a feeling  that something was wrong.  He got up in the middle of the night and was  tracking her down in all the places where she normally was and she was in  none of those places.  His assumption was that she was with somebody else  and then that, he said, threw it over the edge.  By the time of admission  and talking about it, he said he already felt better.  He actually wanted to  go home a day earlier than he was discharged but he was willing to stay just  to make sure that things continued going well for him.  He did have a  session with his wife and it was clear from that session that things were  over.  She had no intention of making things work.  He said he knew that,  really, but it did make it easier to accept because it was so clearly stated  at this point in his life.  Because he consistently denied any suicidal  thoughts while he was in the hospital, he was discharged.   DISCHARGE MEDICATIONS:  Lexapro 10 mg daily.   FOLLOW UP:  He will  follow up with Eriel P. Ilsa Iha, M.D., and Darrold Junker  with an appointment on January 7 and January 13, respectively.   DISCHARGE INSTRUCTIONS:  There were no restrictions placed on his activity  or his diet.   FINAL DIAGNOSES:   AXIS I:  Adjustment disorder with mixed emotional disturbance.   AXIS II:  Rule out dependent personality.   AXIS III:  No diagnosis.   AXIS IV:  Moderate.   AXIS V:  16/10   AXIS V:  60                                               Carolanne Grumbling, M.D.    GT/MEDQ  D:  10/23/2002  T:  10/23/2002  Job:  [id number]

## 2011-03-13 NOTE — Discharge Summary (Signed)
Nathaniel Turner, Nathaniel Turner               ACCOUNT NO.:  [id number]  MEDICAL RECORD NO.:  [id number]           PATIENT TYPE:  I  LOCATION:  1318                         FACILITY:  [facility]  PHYSICIAN:  Andreas Blower, MD       DATE OF BIRTH:  [birth year]  DATE OF ADMISSION:  02/28/2011 DATE OF DISCHARGE:  03/12/2011                              DISCHARGE SUMMARY   PRIMARY CARE PHYSICIAN:  Caryn Bee L. Little, M.D.  ONCOLOGIST:  Velora Heckler. Arbutus Ped, M.D.  PRIMARY GASTROENTEROLOGIST:  Dr. Juanda Chance.  DISCHARGE DIAGNOSES: 1. Acute severe esophagitis secondary to [facility] treatment. 2. Acute esophagitis most likely candida. 3. Dehydration. 4. Hypokalemia. 5. Hyponatremia. 6. Multiple myeloma. 7. Anxiety.  DISCHARGE MEDICATIONS: 1. Entocort EC 9 mg p.o. daily. 2. Chloraseptic Spray 1 spray topically 3 times a day as needed. 3. Lomotil 1 tablet p.o. daily. 4. Fluconazole 100 mg p.o. daily until Mar 20, 2011. 5. Lidocaine 2% TID  as needed 6. Magic mouthwash with the Xylocaine 5 mL four times a day. 7. Oxycodone 5 mg every 6 hours for pain. 8. Metoprolol 25 mg p.o. daily. 9. Anusol Tucks one application rectally twice daily for rectal pain. 10.Prednisone 50 mg p.o. daily. 11.Florastor 500 mg p.o. twice daily. 12.Simethicone 80 mg p.o. twice daily. 13.Lorazepam 1 mg p.o. every 8 hours. 14.Zofran  4 mg p.o. as needed for nausea and vomiting. 15.Amlodipine 5 mg daily 16.Ambien 5 mg po qhs for insomnia.  BRIEF ADMITTING HISTORY AND PHYSICAL:  [name] is a 50 year old Caucasian male who was admitted on Feb 28, 2011, with abdominal pain and colitis. CT showed nonspecific air fluid levels in the right upper quadrant likely related to diarrhea.  CT of the abdomen on Feb 24, 2011, which showed right-sided colitis, possibly sigmoid colitis, slight enlargement of left pelvic side wall lymph nodes, decrease in size of the soft tissue density of the left lung base, right renal lesion similar in size,  likely hemorrhagic/proteinaceous cyst.  Last CBC shows a white count of 9.8, hemoglobin 15.9, hematocrit 43.5, and platelet count 214.  Electrolytes are normal, sodium is 133,  chloride is 95, free T4 was 1.59.  Hepatitis panel is negative.  HOSPITAL COURSE:  Please refer to progress note dictated by Dr. Isidoro Donning on Mar 08, 2011, for more details, 1. Diarrhea with colitis. The patient was admitted to the hospital     under of service of internal medicine and Gastroenterology was     consulted.  Stool studies were done and were negative.  The     patient was slightly dehydrated and he was started on IV fluids     and as the symptoms were improving he was started on a full     liquid diet and his diet was advanced to low residue lactose     free diet, however, the patient started having sore throat as     a result his diet was changed back to a liquid diet.     Given the frequency of the patient's bowel movements, the patient     was placed on high dose of steroids by Gastroenterology.  On Mar 07, 2011, the patient had a colonoscopy which showed a severe     colitis in the transverse colon to the rectum.  The patient's IV     Solu-Medrol was transitioned to p.o. prednisone.  He was started on     Entocort. There were plans for duration of Remicade, given     his symptoms of colitis, related Yervoy treatment. However his     symptoms improved and he did not require remicade. 2.  Severe esophagitis.  During the course of hospital stay, patient     developed sore throat, most likely the patient has acute candidal     esophagitis.  Initially, the patient was placed on magic mouth     wash but was later then started on fluconazole to complete a     course treatment. 3. Metastatic melanoma, management per oncology. 4. Anxiety, p.r.n. Ativan. Will resume p.r.n. Ativan at the time of     discharge. 5. Hypokalemia, resolved with replacement. 6. Hyponatremia, stable.  Most likely due to  diarrhea.  DISPOSITION AND FOLLOWUP:  The patient will follow up with Dr. Juanda Chance with [facility] in 1 week.  The patient is to follow with Dr. Arbutus Ped of Oncology in 1 week.  The patient to follow up with Dr. Catha Gosselin, his primary care physician in 1 week.  Time spent on discharge and talking to patient and coordinating care was 40 minutes.   Andreas Blower, MD   SR/MEDQ  D:  03/12/2011  T:  03/13/2011  Job:  [id number]  Electronically Signed by Wardell Heath Oliviagrace Crisanti  on 03/13/2011 05:59:20 PM

## 2011-03-17 ENCOUNTER — Other Ambulatory Visit: Payer: Self-pay | Admitting: Internal Medicine

## 2011-03-17 ENCOUNTER — Encounter (HOSPITAL_BASED_OUTPATIENT_CLINIC_OR_DEPARTMENT_OTHER): Payer: PRIVATE HEALTH INSURANCE | Admitting: Internal Medicine

## 2011-03-17 DIAGNOSIS — C439 Malignant melanoma of skin, unspecified: Secondary | ICD-10-CM

## 2011-03-17 DIAGNOSIS — R52 Pain, unspecified: Secondary | ICD-10-CM

## 2011-03-17 LAB — CBC WITH DIFFERENTIAL/PLATELET
BASO%: 0.3 % (ref 0.0–2.0)
Basophils Absolute: 0 10*3/uL (ref 0.0–0.1)
EOS%: 0.1 % (ref 0.0–7.0)
Eosinophils Absolute: 0 10*3/uL (ref 0.0–0.5)
HCT: 41.5 % (ref 38.4–49.9)
HGB: 14.4 g/dL (ref 13.0–17.1)
LYMPH%: 14.3 % (ref 14.0–49.0)
MCH: 30 pg (ref 27.2–33.4)
MCHC: 34.8 g/dL (ref 32.0–36.0)
MCV: 86.2 fL (ref 79.3–98.0)
MONO#: 0.4 10*3/uL (ref 0.1–0.9)
MONO%: 8.1 % (ref 0.0–14.0)
NEUT#: 3.7 10*3/uL (ref 1.5–6.5)
NEUT%: 77.2 % — ABNORMAL HIGH (ref 39.0–75.0)
Platelets: 190 10*3/uL (ref 140–400)
RBC: 4.81 10*6/uL (ref 4.20–5.82)
RDW: 14.4 % (ref 11.0–14.6)
WBC: 4.8 10*3/uL (ref 4.0–10.3)
lymph#: 0.7 10*3/uL — ABNORMAL LOW (ref 0.9–3.3)

## 2011-03-17 LAB — COMPREHENSIVE METABOLIC PANEL
ALT: 24 U/L (ref 0–53)
AST: 13 U/L (ref 0–37)
Albumin: 3.4 g/dL — ABNORMAL LOW (ref 3.5–5.2)
Alkaline Phosphatase: 49 U/L (ref 39–117)
BUN: 21 mg/dL (ref 6–23)
CO2: 25 mEq/L (ref 19–32)
Calcium: 8.8 mg/dL (ref 8.4–10.5)
Chloride: 100 mEq/L (ref 96–112)
Creatinine, Ser: 0.96 mg/dL (ref 0.40–1.50)
Glucose, Bld: 115 mg/dL — ABNORMAL HIGH (ref 70–99)
Potassium: 4.5 mEq/L (ref 3.5–5.3)
Sodium: 137 mEq/L (ref 135–145)
Total Bilirubin: 0.6 mg/dL (ref 0.3–1.2)
Total Protein: 5.6 g/dL — ABNORMAL LOW (ref 6.0–8.3)

## 2011-03-17 LAB — TSH: TSH: 0.479 u[IU]/mL (ref 0.350–4.500)

## 2011-03-17 LAB — LACTATE DEHYDROGENASE: LDH: 221 U/L (ref 94–250)

## 2011-03-18 ENCOUNTER — Encounter: Payer: Self-pay | Admitting: Internal Medicine

## 2011-03-18 ENCOUNTER — Telehealth: Payer: Self-pay | Admitting: Internal Medicine

## 2011-03-18 ENCOUNTER — Other Ambulatory Visit (HOSPITAL_COMMUNITY): Payer: PRIVATE HEALTH INSURANCE

## 2011-03-18 NOTE — Telephone Encounter (Signed)
Scheduled patient with Mike Gip, PA on 03/22/11 at 10:15 AM. Patient aware.

## 2011-03-22 ENCOUNTER — Ambulatory Visit (INDEPENDENT_AMBULATORY_CARE_PROVIDER_SITE_OTHER): Payer: PRIVATE HEALTH INSURANCE | Admitting: Physician Assistant

## 2011-03-22 ENCOUNTER — Observation Stay (HOSPITAL_COMMUNITY)
Admission: AD | Admit: 2011-03-22 | Discharge: 2011-03-26 | DRG: 392 | Disposition: A | Discharge status: Home / Self Care | Payer: PRIVATE HEALTH INSURANCE | Source: Ambulatory Visit | Attending: Internal Medicine | Admitting: Internal Medicine

## 2011-03-22 ENCOUNTER — Encounter: Payer: Self-pay | Admitting: Physician Assistant

## 2011-03-22 VITALS — BP 110/74 | HR 108 | Temp 98.0°F | Ht 73.0 in | Wt 190.2 lb

## 2011-03-22 DIAGNOSIS — T451X5A Adverse effect of antineoplastic and immunosuppressive drugs, initial encounter: Secondary | ICD-10-CM | POA: Diagnosis present

## 2011-03-22 DIAGNOSIS — R634 Abnormal weight loss: Secondary | ICD-10-CM

## 2011-03-22 DIAGNOSIS — C801 Malignant (primary) neoplasm, unspecified: Secondary | ICD-10-CM | POA: Diagnosis present

## 2011-03-22 DIAGNOSIS — K5289 Other specified noninfective gastroenteritis and colitis: Secondary | ICD-10-CM

## 2011-03-22 DIAGNOSIS — K529 Noninfective gastroenteritis and colitis, unspecified: Secondary | ICD-10-CM

## 2011-03-22 DIAGNOSIS — C439 Malignant melanoma of skin, unspecified: Secondary | ICD-10-CM

## 2011-03-22 DIAGNOSIS — R197 Diarrhea, unspecified: Secondary | ICD-10-CM

## 2011-03-22 DIAGNOSIS — Y92009 Unspecified place in unspecified non-institutional (private) residence as the place of occurrence of the external cause: Secondary | ICD-10-CM

## 2011-03-22 LAB — CBC
HCT: 43.8 % (ref 39.0–52.0)
Hemoglobin: 14.6 g/dL (ref 13.0–17.0)
MCH: 28.7 pg (ref 26.0–34.0)
MCHC: 33.3 g/dL (ref 30.0–36.0)
MCV: 86.2 fL (ref 78.0–100.0)
Platelets: 135 10*3/uL — ABNORMAL LOW (ref 150–400)
RBC: 5.08 MIL/uL (ref 4.22–5.81)
RDW: 14.3 % (ref 11.5–15.5)
WBC: 4.2 10*3/uL (ref 4.0–10.5)

## 2011-03-22 LAB — DIFFERENTIAL
Basophils Absolute: 0 10*3/uL (ref 0.0–0.1)
Basophils Relative: 0 % (ref 0–1)
Eosinophils Absolute: 0 10*3/uL (ref 0.0–0.7)
Eosinophils Relative: 1 % (ref 0–5)
Lymphocytes Relative: 12 % (ref 12–46)
Lymphs Abs: 0.5 10*3/uL — ABNORMAL LOW (ref 0.7–4.0)
Monocytes Absolute: 0.3 10*3/uL (ref 0.1–1.0)
Monocytes Relative: 7 % (ref 3–12)
Neutro Abs: 3.4 10*3/uL (ref 1.7–7.7)
Neutrophils Relative %: 80 % — ABNORMAL HIGH (ref 43–77)

## 2011-03-22 LAB — BASIC METABOLIC PANEL
BUN: 17 mg/dL (ref 6–23)
CO2: 25 mEq/L (ref 19–32)
Calcium: 8.2 mg/dL — ABNORMAL LOW (ref 8.4–10.5)
Chloride: 99 mEq/L (ref 96–112)
Creatinine, Ser: 1.01 mg/dL (ref 0.4–1.5)
GFR calc Af Amer: >60 mL/min (ref >60)
GFR calc non Af Amer: >60 mL/min (ref >60)
Glucose, Bld: 228 mg/dL — ABNORMAL HIGH (ref 70–99)
Potassium: 4 mEq/L (ref 3.5–5.1)
Sodium: 137 mEq/L (ref 135–145)

## 2011-03-22 NOTE — Assessment & Plan Note (Signed)
Metastatic- completed chemo with yervoy  May 2012

## 2011-03-22 NOTE — Progress Notes (Signed)
Subjective:    Patient ID: Nathaniel Turner, male    DOB: [birth year], 50 y.o.   MRN: [id number]  HPI Xavian Is a pleasant 50 year old male known to Dr. Lina Sar from prior colonoscopies. He had colonoscopy in January of 2010 for followup which showed internal hemorrhoids, a sigmoid colon polyp which was adenomatous.  Patient is currently being treated for stage IV melanoma, and has recently completed a course of Yervoy, under the direction of Dr. Gwenyth Bouillon. Shortly after completion of his chemotherapy he developed diarrhea which became severe and he required admission 573 03/12/2011. He had stool cultures done all of which were negative, then underwent colonoscopy on 514 which showed a severe colitis in the transverse colon to the rectum. Prior to that time he had been initiated on high dose steroids because of high incidence of chemotherapy induced colitis with Yervoy. Despite 125 mg of prednisone daily 8 hours he really showed little improvement and therefore underwent a colonoscopy. He was continued on prednisone and gradually showed some evidence of improvement with decrease in the frequency of his bowel movements and his abdominal pain. He also developed oral thrush and was started on Diflucan. Remicade was discussed with the patient and his wife, as literature has proven this to be an effective treatment for the  [facility] induced colitis if refractory to steroids. He did have a TB test placed and was negative during his admission. Because of gradual improvement he was allowed discharge on 519, to continue prednisone at 60 mg by mouth daily x2 weeks until seen back in the office.  He comes in today, stating that he is no better in that he feels bad. He is having persistent abdominal cramping and has had 5 episodes of diarrhea today thus far. He is having a lot of problems with hemorrhoidal pain and some bleeding. Generally speaking these having 6-9 bowel movements per day all loose to watery associated with  cramping. His appetite has been okay however he has lost a total of 35 pounds since starting his last regimen of chemotherapy. He feels that he is not tolerating steroids very well at this point with significant anxiety, insomnia, and now some depression. He says not sleeping any more than 2 hours per night    Review of Systems  Constitutional: Positive for fatigue and unexpected weight change.  HENT: Negative.   Eyes: Negative.   Respiratory: Negative.   Cardiovascular: Negative.   Gastrointestinal: Positive for abdominal pain, diarrhea and rectal pain.  Genitourinary: Negative.   Musculoskeletal: Positive for arthralgias.  Skin: Negative.   Neurological: Negative.   Hematological: Bruises/bleeds easily.  Psychiatric/Behavioral: Positive for dysphoric mood. The patient is nervous/anxious.    Outpatient Encounter Prescriptions as of 03/22/2011  Medication Sig Dispense Refill  . budesonide (ENTOCORT EC) 3 MG 24 hr capsule Take 6 mg by mouth 3 (three) times daily.        . diphenoxylate-atropine (LOMOTIL) 2.5-0.025 MG per tablet Take 1 tablet by mouth 3 (three) times daily.        . fluconazole (DIFLUCAN) 100 MG tablet Take 100 mg by mouth 2 (two) times daily.        Marland Kitchen omeprazole (PRILOSEC) 20 MG capsule Take 20 mg by mouth 2 (two) times daily.        . predniSONE (DELTASONE) 20 MG tablet Take 20 mg by mouth 3 (three) times daily.        Marland Kitchen witch hazel-glycerin (TUCKS) pad Place rectally as needed.        Marland Kitchen  DISCONTD: docusate sodium (COLACE) 100 MG capsule Take 100 mg by mouth 3 (three) times daily as needed.         Allergies  Allergen Reactions  . Codeine   . Oxycodone Rash   Active Ambulatory Problems    Diagnosis Date Noted  . MELANOMA OF SKIN, SITE UNSPECIFIED 01/03/2008  . POLYP, COLON 01/03/2008  . INTERNAL HEMORRHOIDS 01/03/2008  . HEMORRHAGE OF RECTUM AND ANUS 10/31/2008   Resolved Ambulatory Problems    Diagnosis Date Noted  . HEMORRHOIDS 01/03/2008  . Abdominal pain,  left lower quadrant 08/14/2008   Past Medical History  Diagnosis Date  . Melanoma   . Metastatic malignant melanoma         Objective:   Physical Exam well-developed white male in no acute distress, alert and oriented x3 . He is tachycardic with pulse of 108  HEENT; nontraumatic normocephalic EOMI PERRLA sclera anicteric  Neck; supple no JVD  Cardiovascular; regular rate and rhythm with S1-S2 no murmur rub or gallop  Pulmonary;clear bilateral, Abdomen;soft,nondistended ,no palpable mass or hepatosplenomegaly ,rather diffuse tenderness,nonfocal, Rectal; not done Skin; dry with rather diffuse erythema, small macules nonpruritic nonpainful Neuro alert and oriented x3 grossly nonfocal  Psych;mood anxious but otherwise appropriate.        Assessment & Plan:  #57 50 year old male with metastatic melanoma status post chemotherapy with Yervoy x4 completed approximately one month ago and complicated by a severe colitis noted to be associated with Yervoy.Marland Kitchen He is at this point refractory to steroids and failing outpatient management.  Plan Admit to [facility] long hospital for IV fluid hydration and initiation of Remicade. Would anticipate him completing an induction regimen, and depending on response may need a continued regimen.  Will plan to taper him off of steroids over the next couple of weeks and we'll decrease his dose to 40 mg per day now given the significant side effects with anxiety and insomnia. Will notify Dr. Shirline Frees of patient's admission as he is due for a restaging imaging.  #2 Anxiety/depression-situational and exacerbated by steroids Decrease steroids as above Start Ativan as needed for anxiety Consider trial of an antidepressant.

## 2011-03-23 DIAGNOSIS — K5289 Other specified noninfective gastroenteritis and colitis: Secondary | ICD-10-CM

## 2011-03-23 DIAGNOSIS — C439 Malignant melanoma of skin, unspecified: Secondary | ICD-10-CM

## 2011-03-23 NOTE — Progress Notes (Signed)
Agree with assessment and plan in Ms.Nathaniel Turner's note. Would add that patient needs hepatitis B surface antigen testing before he starts Remicade unless this was already done.

## 2011-03-24 DIAGNOSIS — K5289 Other specified noninfective gastroenteritis and colitis: Secondary | ICD-10-CM

## 2011-03-24 DIAGNOSIS — C439 Malignant melanoma of skin, unspecified: Secondary | ICD-10-CM

## 2011-03-24 LAB — COMPREHENSIVE METABOLIC PANEL
ALT: 29 U/L (ref 0–53)
AST: 15 U/L (ref 0–37)
Albumin: 2.8 g/dL — ABNORMAL LOW (ref 3.5–5.2)
Alkaline Phosphatase: 56 U/L (ref 39–117)
BUN: 14 mg/dL (ref 6–23)
CO2: 29 mEq/L (ref 19–32)
Calcium: 8.7 mg/dL (ref 8.4–10.5)
Chloride: 101 mEq/L (ref 96–112)
Creatinine, Ser: 0.91 mg/dL (ref 0.4–1.5)
GFR calc Af Amer: >60 mL/min (ref >60)
GFR calc non Af Amer: >60 mL/min (ref >60)
Glucose, Bld: 162 mg/dL — ABNORMAL HIGH (ref 70–99)
Potassium: 3.7 mEq/L (ref 3.5–5.1)
Sodium: 136 mEq/L (ref 135–145)
Total Bilirubin: 0.3 mg/dL (ref 0.3–1.2)
Total Protein: 5.7 g/dL — ABNORMAL LOW (ref 6.0–8.3)

## 2011-03-24 LAB — CLOSTRIDIUM DIFFICILE BY PCR: Toxigenic C. Difficile by PCR: NEGATIVE

## 2011-03-25 DIAGNOSIS — R197 Diarrhea, unspecified: Secondary | ICD-10-CM

## 2011-03-25 LAB — PREALBUMIN: Prealbumin: 31.3 mg/dL (ref 17.0–34.0)

## 2011-03-29 ENCOUNTER — Telehealth: Payer: Self-pay | Admitting: Internal Medicine

## 2011-03-29 NOTE — Telephone Encounter (Signed)
Patient states he received Remicade on 03/22/11 and wants to schedule the second Remicade. Please, advise

## 2011-03-30 MED ORDER — INFLIXIMAB 100 MG IV SOLR: INTRAVENOUS | Status: DC

## 2011-03-30 NOTE — Telephone Encounter (Signed)
Scheduled patient at [facility] short stay on 04/06/11 arrive at 3:45 PM for 4:00 PM-6:00 PM for second treatment. Scheduled for 3rd dose on 05/05/11- 4:00PM-6:00PM. Patient aware. Orders faxed.

## 2011-03-30 NOTE — Telephone Encounter (Signed)
Please schedule  Second Remicade 14 days from the first one, and the third infusion  30 days after second one.

## 2011-03-31 ENCOUNTER — Other Ambulatory Visit: Payer: Self-pay | Admitting: Internal Medicine

## 2011-03-31 DIAGNOSIS — C9000 Multiple myeloma not having achieved remission: Secondary | ICD-10-CM

## 2011-04-04 ENCOUNTER — Telehealth: Payer: Self-pay | Admitting: Internal Medicine

## 2011-04-04 NOTE — Telephone Encounter (Signed)
Refaxed orders again to short stay

## 2011-04-06 ENCOUNTER — Encounter (HOSPITAL_COMMUNITY): Payer: PRIVATE HEALTH INSURANCE | Attending: Internal Medicine

## 2011-04-06 ENCOUNTER — Other Ambulatory Visit: Payer: Self-pay | Admitting: Internal Medicine

## 2011-04-06 DIAGNOSIS — IMO0002 Reserved for concepts with insufficient information to code with codable children: Secondary | ICD-10-CM | POA: Insufficient documentation

## 2011-04-06 DIAGNOSIS — T451X5A Adverse effect of antineoplastic and immunosuppressive drugs, initial encounter: Secondary | ICD-10-CM | POA: Insufficient documentation

## 2011-04-06 DIAGNOSIS — C801 Malignant (primary) neoplasm, unspecified: Secondary | ICD-10-CM | POA: Insufficient documentation

## 2011-04-06 DIAGNOSIS — K5289 Other specified noninfective gastroenteritis and colitis: Secondary | ICD-10-CM | POA: Insufficient documentation

## 2011-04-06 DIAGNOSIS — Z79899 Other long term (current) drug therapy: Secondary | ICD-10-CM | POA: Insufficient documentation

## 2011-04-06 DIAGNOSIS — C439 Malignant melanoma of skin, unspecified: Secondary | ICD-10-CM | POA: Insufficient documentation

## 2011-04-06 MED ORDER — LORAZEPAM 1 MG PO TABS: ORAL_TABLET | ORAL | Status: DC

## 2011-04-06 NOTE — Telephone Encounter (Signed)
Patient states he is taking Lorazepam 1 mg po TID prn because he is on Prednisone. States he has gotten it from Dr. Clarene Duke in past but he thought we prescribed it last after the hospitalization. Ok for [place] to fill or should his PCP refill? Please, advise

## 2011-04-06 NOTE — Telephone Encounter (Signed)
Spoke with Nathaniel Gip, PA. May give patient Lorazepam 1 mg Sig: 1/2- 1 tab TID prn anxiety. Encourage patient to try to decrease down to 1/2 tab TID prn. Patient instructed on this and states he will try to decrease to 1/2 tab. Rx sent to pharmacy

## 2011-04-11 ENCOUNTER — Encounter (HOSPITAL_COMMUNITY)
Admission: RE | Admit: 2011-04-11 | Discharge: 2011-04-11 | Disposition: A | Discharge status: Home / Self Care | Payer: PRIVATE HEALTH INSURANCE | Source: Ambulatory Visit | Attending: Internal Medicine | Admitting: Internal Medicine

## 2011-04-11 ENCOUNTER — Encounter (HOSPITAL_COMMUNITY): Payer: Self-pay

## 2011-04-11 ENCOUNTER — Other Ambulatory Visit: Payer: Self-pay | Admitting: Internal Medicine

## 2011-04-11 DIAGNOSIS — C439 Malignant melanoma of skin, unspecified: Secondary | ICD-10-CM

## 2011-04-11 DIAGNOSIS — K149 Disease of tongue, unspecified: Secondary | ICD-10-CM | POA: Insufficient documentation

## 2011-04-11 DIAGNOSIS — C9000 Multiple myeloma not having achieved remission: Secondary | ICD-10-CM

## 2011-04-11 DIAGNOSIS — C4360 Malignant melanoma of unspecified upper limb, including shoulder: Secondary | ICD-10-CM | POA: Insufficient documentation

## 2011-04-11 LAB — GLUCOSE, CAPILLARY: Glucose-Capillary: 150 mg/dL — ABNORMAL HIGH (ref 70–99)

## 2011-04-11 MED ORDER — FLUDEOXYGLUCOSE F - 18 (FDG) INJECTION
19.3000 | Freq: Once | INTRAVENOUS | Status: AC | PRN
  Administered 2011-04-11: 19.3 via INTRAVENOUS

## 2011-04-13 ENCOUNTER — Ambulatory Visit (HOSPITAL_COMMUNITY): Payer: PRIVATE HEALTH INSURANCE | Attending: Internal Medicine

## 2011-04-13 DIAGNOSIS — E860 Dehydration: Secondary | ICD-10-CM | POA: Insufficient documentation

## 2011-04-14 ENCOUNTER — Other Ambulatory Visit: Payer: Self-pay | Admitting: Internal Medicine

## 2011-04-14 ENCOUNTER — Encounter (HOSPITAL_BASED_OUTPATIENT_CLINIC_OR_DEPARTMENT_OTHER): Payer: PRIVATE HEALTH INSURANCE | Admitting: Internal Medicine

## 2011-04-14 DIAGNOSIS — C439 Malignant melanoma of skin, unspecified: Secondary | ICD-10-CM

## 2011-04-14 DIAGNOSIS — C4360 Malignant melanoma of unspecified upper limb, including shoulder: Secondary | ICD-10-CM

## 2011-04-14 LAB — CBC WITH DIFFERENTIAL/PLATELET
BASO%: 0 % (ref 0.0–2.0)
Basophils Absolute: 0 10*3/uL (ref 0.0–0.1)
EOS%: 0 % (ref 0.0–7.0)
Eosinophils Absolute: 0 10*3/uL (ref 0.0–0.5)
HCT: 40.1 % (ref 38.4–49.9)
HGB: 13.5 g/dL (ref 13.0–17.1)
LYMPH%: 6.9 % — ABNORMAL LOW (ref 14.0–49.0)
MCH: 30.2 pg (ref 27.2–33.4)
MCHC: 33.7 g/dL (ref 32.0–36.0)
MCV: 89.7 fL (ref 79.3–98.0)
MONO#: 0.1 10*3/uL (ref 0.1–0.9)
MONO%: 0.5 % (ref 0.0–14.0)
NEUT#: 11 10*3/uL — ABNORMAL HIGH (ref 1.5–6.5)
NEUT%: 92.6 % — ABNORMAL HIGH (ref 39.0–75.0)
Platelets: 166 10*3/uL (ref 140–400)
RBC: 4.47 10*6/uL (ref 4.20–5.82)
RDW: 17 % — ABNORMAL HIGH (ref 11.0–14.6)
WBC: 11.9 10*3/uL — ABNORMAL HIGH (ref 4.0–10.3)
lymph#: 0.8 10*3/uL — ABNORMAL LOW (ref 0.9–3.3)

## 2011-04-14 LAB — LACTATE DEHYDROGENASE: LDH: 290 U/L — ABNORMAL HIGH (ref 94–250)

## 2011-04-14 LAB — COMPREHENSIVE METABOLIC PANEL
ALT: 24 U/L (ref 0–53)
AST: 14 U/L (ref 0–37)
Albumin: 3.5 g/dL (ref 3.5–5.2)
Alkaline Phosphatase: 44 U/L (ref 39–117)
BUN: 11 mg/dL (ref 6–23)
CO2: 25 mEq/L (ref 19–32)
Calcium: 8.8 mg/dL (ref 8.4–10.5)
Chloride: 105 mEq/L (ref 96–112)
Creatinine, Ser: 0.73 mg/dL (ref 0.50–1.35)
Glucose, Bld: 168 mg/dL — ABNORMAL HIGH (ref 70–99)
Potassium: 4.2 mEq/L (ref 3.5–5.3)
Sodium: 140 mEq/L (ref 135–145)
Total Bilirubin: 0.3 mg/dL (ref 0.3–1.2)
Total Protein: 5.5 g/dL — ABNORMAL LOW (ref 6.0–8.3)

## 2011-04-14 LAB — TSH: TSH: 0.21 u[IU]/mL — ABNORMAL LOW (ref 0.350–4.500)

## 2011-04-22 ENCOUNTER — Other Ambulatory Visit (HOSPITAL_COMMUNITY): Payer: Self-pay | Admitting: Otolaryngology

## 2011-04-22 DIAGNOSIS — R221 Localized swelling, mass and lump, neck: Secondary | ICD-10-CM

## 2011-04-28 ENCOUNTER — Encounter: Payer: Self-pay | Admitting: Internal Medicine

## 2011-04-28 ENCOUNTER — Ambulatory Visit (INDEPENDENT_AMBULATORY_CARE_PROVIDER_SITE_OTHER): Payer: PRIVATE HEALTH INSURANCE | Admitting: Internal Medicine

## 2011-04-28 VITALS — BP 124/76 | HR 88 | Ht 73.0 in | Wt 204.0 lb

## 2011-04-28 DIAGNOSIS — K5289 Other specified noninfective gastroenteritis and colitis: Secondary | ICD-10-CM

## 2011-04-28 DIAGNOSIS — C799 Secondary malignant neoplasm of unspecified site: Secondary | ICD-10-CM

## 2011-04-28 DIAGNOSIS — C439 Malignant melanoma of skin, unspecified: Secondary | ICD-10-CM

## 2011-04-28 NOTE — Progress Notes (Signed)
Tabitha Tupper 02-11-1961 MRN [id number]     History of Present Illness:  This is a 50 year old white male with metastatic melanoma since 2007. He is status post recent hospitalization for acute colitis due to [facility]. He responded to a prednisone taper and Remicade infusion. He has completed 2 infusions and is due for the third infusion next week. He denies any abdominal pain, diarrhea, rectal bleeding. He has been tolerating a regular diet. He is currently on prednisone 10 mg a day for 5 more days and then stops. A CT scan of the abdomen recently showed acute colitis with enlargement of left pelvic sidewall nodes.    Past Medical History  Diagnosis Date  . Melanoma     melanoma dx 2007;  . Internal hemorrhoids without mention of complication   . Metastatic malignant melanoma   . Colon polyp     adenomatous  . Anxiety and depression   . Esophagitis     severe-due to [facility] treatment  . Colitis     secondary to [facility] treatment   Past Surgical History  Procedure Date  . Melanoma excision 2008    Lymph node removal  . Shoulder surgery 2010  . Polypectomy     reports that he has quit smoking. He has never used smokeless tobacco. He reports that he does not drink alcohol or use illicit drugs. family history includes Coronary artery disease in an unspecified family member and Diabetes in an unspecified family member. Allergies  Allergen Reactions  . Codeine   . Oxycodone Rash        Review of Systems:Denies dysphagia, odynophagia, chest pain or shortness of breath  The remainder of the 10  point ROS is negative except as outlined in H&P.   Physical Exam: General appearance  Well developed, in no distress. Eyes- non icteric. HEENT nontraumatic, normocephalic. Mouth no lesions, tongue papillated, no cheilosis. Neck supple without adenopathy, thyroid not enlarged, no carotid bruits, no JVD. Lungs Clear to auscultation bilaterally. Cor normal S1 normal S2, regular rhythm ,  no murmur,  quiet precordium. Abdomen Soft nontender abdomen with normoactive bowel sounds.  Rectal:Soft Hemoccult negative stool.  Extremities no pedal edema. Skin no lesions, status post excision of melanoma right shoulder . Neurological alert and oriented x 3. Psychological normal mood and affect.  Assessment and Plan:  Problem #1 Yervoy -induced colitis with complete resolution. Patient has responded to Remicade infusions x 2. I have spoken to Dr. Lisette Abu in [place] who prefers to discontinue Remicade at this point. No further followup GI care is necessary unless he develops specific problems.  Problem #2 metastatic melanoma followed by Dr. Shirline Frees who is considering sending patient to [facility] or to [facility].   04/28/2011 Lina Sar

## 2011-04-28 NOTE — Patient Instructions (Addendum)
Discontinue Remicade infusions as per Dr Shanda Bumps recommendations. We have discontinued your Remicade with [facility] at [facility]. CC: Dr Lisette Abu [facility], [place]), phone [phone], Dr Si Gaul

## 2011-05-02 ENCOUNTER — Ambulatory Visit (HOSPITAL_COMMUNITY)
Admission: RE | Admit: 2011-05-02 | Discharge: 2011-05-02 | Disposition: A | Discharge status: Home / Self Care | Payer: PRIVATE HEALTH INSURANCE | Source: Ambulatory Visit | Attending: Internal Medicine | Admitting: Internal Medicine

## 2011-05-02 ENCOUNTER — Encounter (HOSPITAL_COMMUNITY): Payer: Self-pay

## 2011-05-02 ENCOUNTER — Other Ambulatory Visit: Payer: Self-pay | Admitting: Internal Medicine

## 2011-05-02 DIAGNOSIS — Z79899 Other long term (current) drug therapy: Secondary | ICD-10-CM | POA: Insufficient documentation

## 2011-05-02 DIAGNOSIS — C439 Malignant melanoma of skin, unspecified: Secondary | ICD-10-CM

## 2011-05-02 DIAGNOSIS — C4360 Malignant melanoma of unspecified upper limb, including shoulder: Secondary | ICD-10-CM | POA: Insufficient documentation

## 2011-05-02 DIAGNOSIS — R42 Dizziness and giddiness: Secondary | ICD-10-CM | POA: Insufficient documentation

## 2011-05-02 MED ORDER — IOHEXOL 300 MG/ML  SOLN
100.0000 mL | Freq: Once | INTRAMUSCULAR | Status: AC | PRN
  Administered 2011-05-02: 100 mL via INTRAVENOUS

## 2011-05-03 ENCOUNTER — Other Ambulatory Visit: Payer: Self-pay | Admitting: Otolaryngology

## 2011-05-03 ENCOUNTER — Other Ambulatory Visit: Payer: Self-pay | Admitting: Interventional Radiology

## 2011-05-03 ENCOUNTER — Ambulatory Visit (HOSPITAL_COMMUNITY): Payer: PRIVATE HEALTH INSURANCE

## 2011-05-03 ENCOUNTER — Ambulatory Visit (HOSPITAL_COMMUNITY)
Admission: RE | Admit: 2011-05-03 | Discharge: 2011-05-03 | Disposition: A | Discharge status: Home / Self Care | Payer: PRIVATE HEALTH INSURANCE | Source: Ambulatory Visit | Attending: Otolaryngology | Admitting: Otolaryngology

## 2011-05-03 DIAGNOSIS — R221 Localized swelling, mass and lump, neck: Secondary | ICD-10-CM

## 2011-05-03 DIAGNOSIS — R599 Enlarged lymph nodes, unspecified: Secondary | ICD-10-CM | POA: Insufficient documentation

## 2011-05-03 DIAGNOSIS — Z85820 Personal history of malignant melanoma of skin: Secondary | ICD-10-CM | POA: Insufficient documentation

## 2011-05-03 DIAGNOSIS — Z79899 Other long term (current) drug therapy: Secondary | ICD-10-CM | POA: Insufficient documentation

## 2011-05-03 LAB — CBC
HCT: 38.9 % — ABNORMAL LOW (ref 39.0–52.0)
Hemoglobin: 12.7 g/dL — ABNORMAL LOW (ref 13.0–17.0)
MCH: 29.2 pg (ref 26.0–34.0)
MCHC: 32.6 g/dL (ref 30.0–36.0)
MCV: 89.4 fL (ref 78.0–100.0)
Platelets: 176 10*3/uL (ref 150–400)
RBC: 4.35 MIL/uL (ref 4.22–5.81)
RDW: 15.3 % (ref 11.5–15.5)
WBC: 7.8 10*3/uL (ref 4.0–10.5)

## 2011-05-03 LAB — APTT: aPTT: 32 seconds (ref 24–37)

## 2011-05-03 LAB — PROTIME-INR
INR: 0.93 (ref 0.00–1.49)
Prothrombin Time: 12.7 seconds (ref 11.6–15.2)

## 2011-05-05 ENCOUNTER — Encounter (HOSPITAL_COMMUNITY): Payer: PRIVATE HEALTH INSURANCE

## 2011-05-10 ENCOUNTER — Other Ambulatory Visit: Payer: Self-pay | Admitting: Internal Medicine

## 2011-05-10 DIAGNOSIS — C9000 Multiple myeloma not having achieved remission: Secondary | ICD-10-CM

## 2011-05-15 ENCOUNTER — Other Ambulatory Visit (HOSPITAL_COMMUNITY): Payer: PRIVATE HEALTH INSURANCE

## 2011-05-18 ENCOUNTER — Encounter (HOSPITAL_BASED_OUTPATIENT_CLINIC_OR_DEPARTMENT_OTHER): Payer: PRIVATE HEALTH INSURANCE | Admitting: Internal Medicine

## 2011-05-18 ENCOUNTER — Other Ambulatory Visit: Payer: Self-pay | Admitting: Internal Medicine

## 2011-05-18 DIAGNOSIS — R52 Pain, unspecified: Secondary | ICD-10-CM

## 2011-05-18 DIAGNOSIS — C439 Malignant melanoma of skin, unspecified: Secondary | ICD-10-CM

## 2011-05-18 LAB — COMPREHENSIVE METABOLIC PANEL
ALT: 15 U/L (ref 0–53)
AST: 14 U/L (ref 0–37)
Albumin: 3.7 g/dL (ref 3.5–5.2)
Alkaline Phosphatase: 68 U/L (ref 39–117)
BUN: 11 mg/dL (ref 6–23)
CO2: 26 mEq/L (ref 19–32)
Calcium: 9.6 mg/dL (ref 8.4–10.5)
Chloride: 106 mEq/L (ref 96–112)
Creatinine, Ser: 0.76 mg/dL (ref 0.50–1.35)
Glucose, Bld: 119 mg/dL — ABNORMAL HIGH (ref 70–99)
Potassium: 4 mEq/L (ref 3.5–5.3)
Sodium: 143 mEq/L (ref 135–145)
Total Bilirubin: 0.3 mg/dL (ref 0.3–1.2)
Total Protein: 7.2 g/dL (ref 6.0–8.3)

## 2011-05-18 LAB — CBC WITH DIFFERENTIAL/PLATELET
BASO%: 0.7 % (ref 0.0–2.0)
Basophils Absolute: 0 10*3/uL (ref 0.0–0.1)
EOS%: 1.3 % (ref 0.0–7.0)
Eosinophils Absolute: 0.1 10*3/uL (ref 0.0–0.5)
HCT: 38.6 % (ref 38.4–49.9)
HGB: 13.1 g/dL (ref 13.0–17.1)
LYMPH%: 32.9 % (ref 14.0–49.0)
MCH: 30.4 pg (ref 27.2–33.4)
MCHC: 34 g/dL (ref 32.0–36.0)
MCV: 89.6 fL (ref 79.3–98.0)
MONO#: 0.7 10*3/uL (ref 0.1–0.9)
MONO%: 13.9 % (ref 0.0–14.0)
NEUT#: 2.6 10*3/uL (ref 1.5–6.5)
NEUT%: 51.2 % (ref 39.0–75.0)
Platelets: 255 10*3/uL (ref 140–400)
RBC: 4.31 10*6/uL (ref 4.20–5.82)
RDW: 16.2 % — ABNORMAL HIGH (ref 11.0–14.6)
WBC: 5.1 10*3/uL (ref 4.0–10.3)
lymph#: 1.7 10*3/uL (ref 0.9–3.3)

## 2011-05-18 LAB — LACTATE DEHYDROGENASE: LDH: 240 U/L (ref 94–250)

## 2011-06-12 NOTE — Discharge Summary (Signed)
Nathaniel Turner, ORSAK               ACCOUNT NO.:  [id number]  MEDICAL RECORD NO.:  [id number]  LOCATION:  1307                         FACILITY:  [facility]  PHYSICIAN:  Hedwig Morton. Juanda Chance, MD     DATE OF BIRTH:  1960-12-02  DATE OF ADMISSION:  03/22/2011 DATE OF DISCHARGE:  03/26/2011                              DISCHARGE SUMMARY   DISCHARGING PHYSICIAN:  Hedwig Morton. Juanda Chance, MD.  PRIMARY GASTROENTEROLOGIST:  Hedwig Morton. Juanda Chance, MD  DISPOSITION:  To home in stable condition.  DISCHARGE MEDICATIONS: 1. Dicyclomine 20 mg twice daily as needed. 2. Tylenol 325 mg every 8 hours as needed. 3. Flora-Q 1 capsule daily. 4. Lidocaine 2% jelly apply to affected areas 3 times daily as needed. 5. Imodium 2 mg 1 tablet by mouth twice daily as needed. 6. Ativan 0.5 mg three times a day as needed. 7. Prednisone 80 mg daily. 8. Ambien 10 mg at bedtime as needed. 9. Omeprazole 20 mg twice daily. 10.Oxycodone IR 5 mg 1 tablet every 8 hours as needed. CONSULTATIONS:  None requested.  Dr. Jerolyn Center (Oncology) made a social visit with the patient.  PROCEDURES:  None performed this admission.  DISCHARGE DIAGNOSES: 1. Chemotherapy-induced colitis, improving. 2. Metastatic melanoma.  HOSPITAL COURSE:  Nathaniel Turner is a 50 year old male known to Dr. Juanda Chance most recently for his history of chemotherapy-induced colitis.  The patient had been recently treated for stage 4 melanoma with Yervoy under the direction of Dr. Arbutus Ped.  Shortly after completion of chemotherapy, the patient was hospitalized with severe diarrhea, colonoscopy showed severe colitis. High-dose steroids were initiated at the time of that hospitalization.  The patient seemed to improve and was sent home on a prednisone taper. He was readmitted after presenting to our office with recurrent diarrhea and abdominal pain. Upon admission Remicade was inititated for treatment of Yervoy induced colitis. Of note, Mr. Radde had a PPD placed during his  previous hospitalization in preparation for Remicade but it was not felt to be needed at that time. PPD was negative.  Upon arrival to the hospital, we spoke to the patient's oncologist, Dr. Arbutus Ped.  He was kind enough to call Dr. Nelson Chimes, a friend and colleague of his in the [place] area who had participated in the [place] trials and had seen and treated this chemotherapy induced colitis.  Subsequent to that, Remicade was initiated.  The patient was started on prednisone 80 mg which would not be tapered until at least 4 weeks.  He was started on the [employer] C difficile PCR was repeated and negative. By the third day of admission, Mr. Dombkowski was feeling better, his stools were less frequent, less voluminous.  He was tolerating a BRAT diet and his abdominal pain had improved.  He was discharged home  with plans to follow up in the office with Dr. Juanda Chance.  Subsequent Remicade infusions would be based on the patient's response to treatment.  It is possible the patient would need a repeat dose in 2 weeks.  I did have the opportunity to speak with Dr. Nelson Chimes myself about [facility] induced colitis. In his experience, some patients have required only one dose of Remicade but others have required a  repeat infusion at 2 weeks, very few have required infusions beyond that.     Willette Cluster, NP   ______________________________ Hedwig Morton. Juanda Chance, MD    PG/MEDQ  D:  04/27/2011  T:  04/27/2011  Job:  [id number]  Electronically Signed by Willette Cluster NP on 06/06/2011 10:10:30 AM Electronically Signed by Lina Sar MD on 06/12/2011 08:02:36 AM

## 2011-09-22 ENCOUNTER — Other Ambulatory Visit: Payer: Self-pay | Admitting: Family Medicine

## 2011-09-22 DIAGNOSIS — M545 Low back pain, unspecified: Secondary | ICD-10-CM

## 2011-09-23 ENCOUNTER — Ambulatory Visit
Admission: RE | Admit: 2011-09-23 | Discharge: 2011-09-23 | Disposition: A | Discharge status: Home / Self Care | Payer: PRIVATE HEALTH INSURANCE | Source: Ambulatory Visit | Attending: Family Medicine | Admitting: Family Medicine

## 2011-09-23 DIAGNOSIS — M545 Low back pain, unspecified: Secondary | ICD-10-CM

## 2011-09-23 MED ORDER — GADOBENATE DIMEGLUMINE 529 MG/ML IV SOLN
17.0000 mL | Freq: Once | INTRAVENOUS | Status: AC | PRN
  Administered 2011-09-23: 17 mL via INTRAVENOUS

## 2011-12-08 ENCOUNTER — Encounter: Payer: Self-pay | Admitting: Internal Medicine

## 2012-02-27 ENCOUNTER — Encounter: Payer: Self-pay | Admitting: Internal Medicine

## 2012-03-30 ENCOUNTER — Telehealth: Payer: Self-pay | Admitting: *Deleted

## 2012-03-30 ENCOUNTER — Ambulatory Visit: Payer: PRIVATE HEALTH INSURANCE | Admitting: *Deleted

## 2012-03-30 VITALS — Ht 75.0 in | Wt 193.0 lb

## 2012-03-30 DIAGNOSIS — Z1211 Encounter for screening for malignant neoplasm of colon: Secondary | ICD-10-CM

## 2012-03-30 MED ORDER — MOVIPREP 100 G PO SOLR: ORAL | Status: DC

## 2012-03-30 NOTE — Telephone Encounter (Signed)
Pt. Came for pv at 1pm missed am appt.

## 2012-03-30 NOTE — Telephone Encounter (Signed)
Message left for pt. To call before 5pm   as he did not come for pre-visit and if he does not call procedure will be cx. And he will need to r/s.

## 2012-04-02 ENCOUNTER — Telehealth: Payer: Self-pay | Admitting: *Deleted

## 2012-04-02 NOTE — Telephone Encounter (Signed)
Pt notified that recall colonoscopy is not due until 10/2013.  Colonoscopy scheduled for 6/21 cancelled.  Recall is in [facility] for 2015

## 2012-04-13 ENCOUNTER — Encounter: Payer: PRIVATE HEALTH INSURANCE | Admitting: Internal Medicine

## 2012-10-04 IMAGING — CT CT FEMUR *L* W/ CM
5 series · 16 of 33 positions shown, 18 images · IV contrast (agent unspecified)
Comparison: None

CLINICAL DATA: Left thigh pain and swelling.

CT LEFT THIGH WITH CONTRAST
TECHNIQUE: Multidetector CT imaging of the left thigh was
performed according to the standard protocol following intravenous
contrast administration. Multiplanar CT image reconstructions were
also generated.
Contrast: 100 ml Smnipaque-EFF

[Series 5: lt femur 3.0 b40s · axial · 0.49mm/px · z∈[-554,-365]mm · 2 of 191 slices shown (1 of 2)]
[im 64/191  bone]
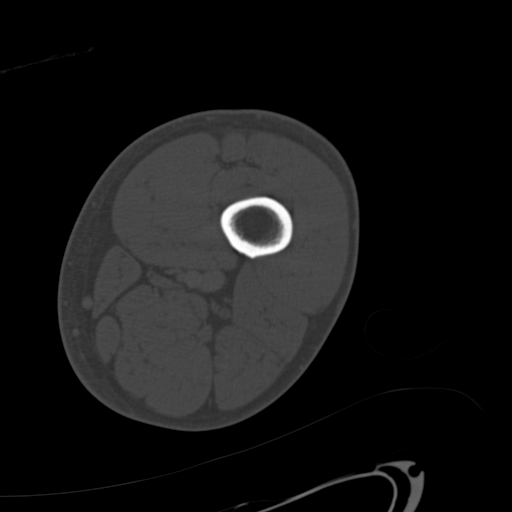
[im 127/191  bone]
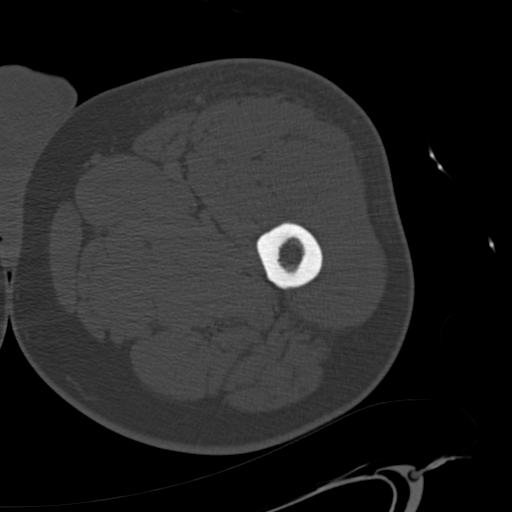

[Series 7: lt femur 3.0 b40s · axial · 0.91mm/px · z∈[-554,-365]mm · 2 of 191 slices shown (2 of 2)]
[im 64/191  bone]
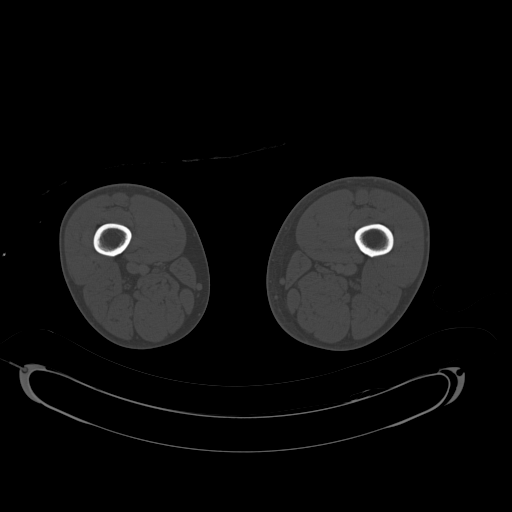
[im 127/191  bone]
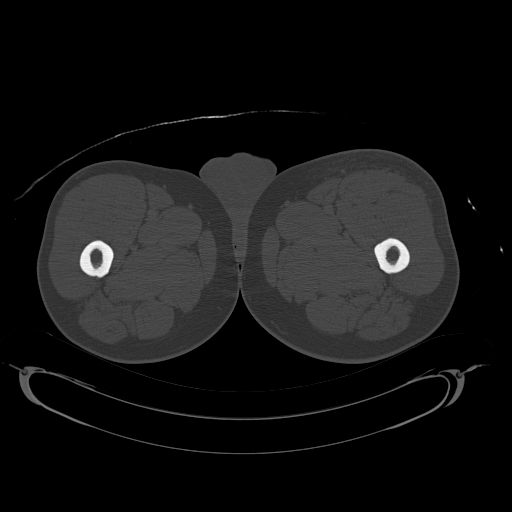

[Series 602: coronal images · coronal · 1.12mm/px · 3 of 122 slices shown]
[im 25/122  bone]
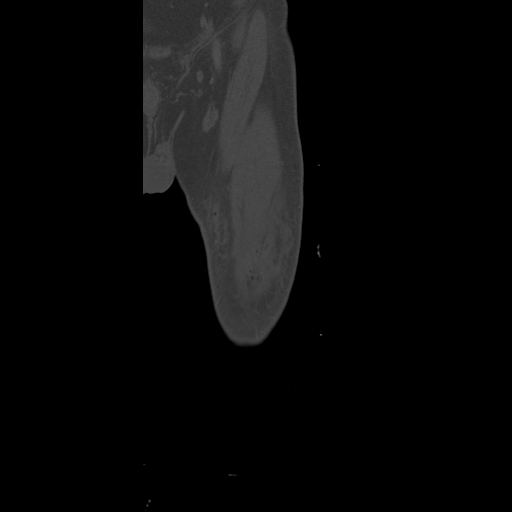
[im 49/122  bone]
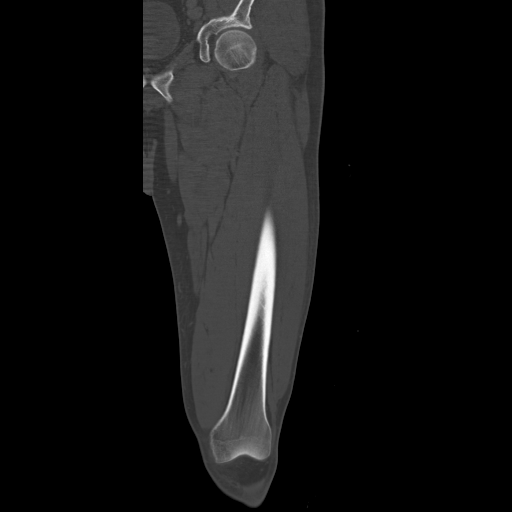
[im 73/122  bone]
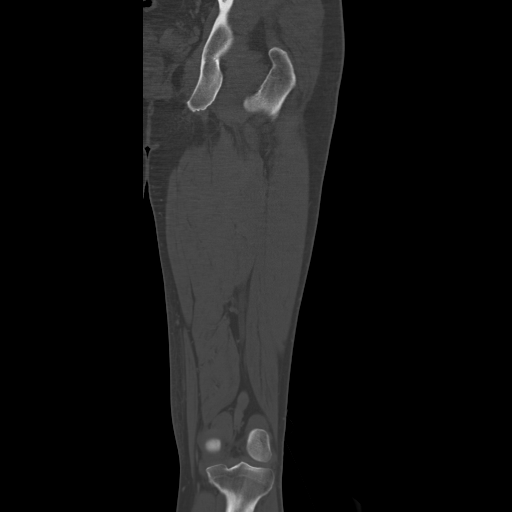

[Series 603: axial st · axial · 1.12mm/px · z∈[-631,-286]mm · 4 of 285 slices shown, 5 images]
[im 57/285  soft-tissue]
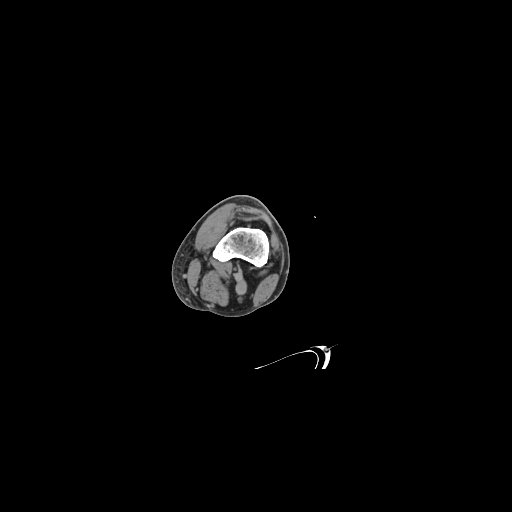
[im 57/285  bone]
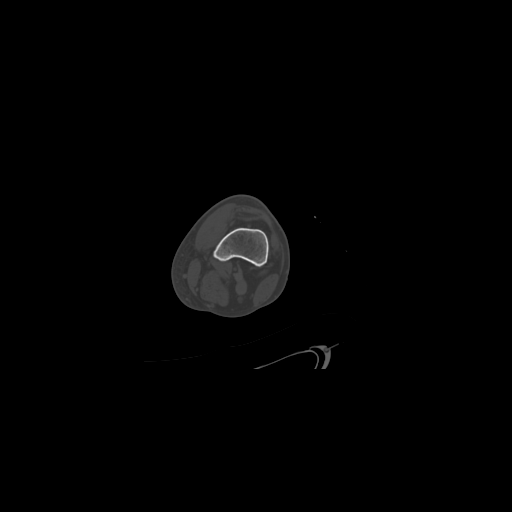
[im 114/285  bone]
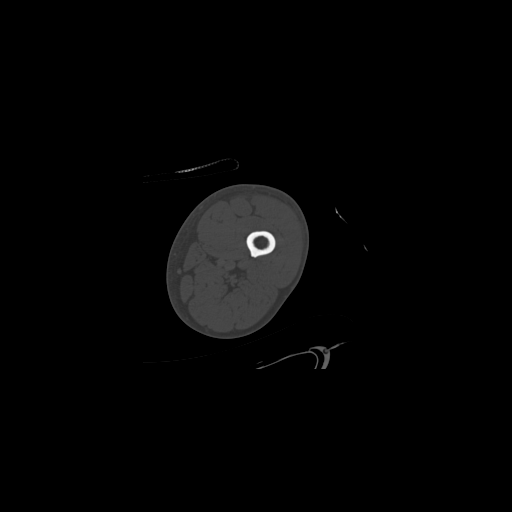
[im 171/285  bone]
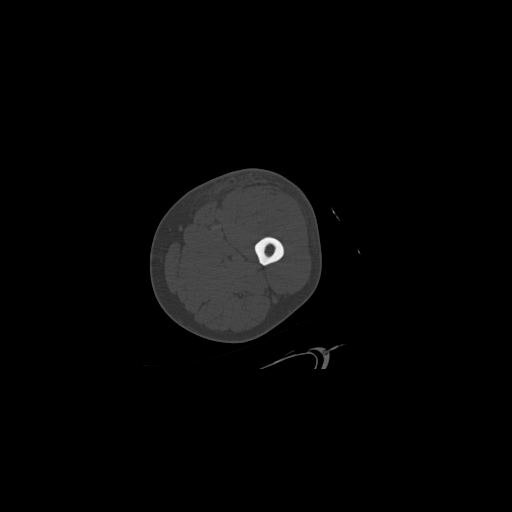
[im 228/285  bone]
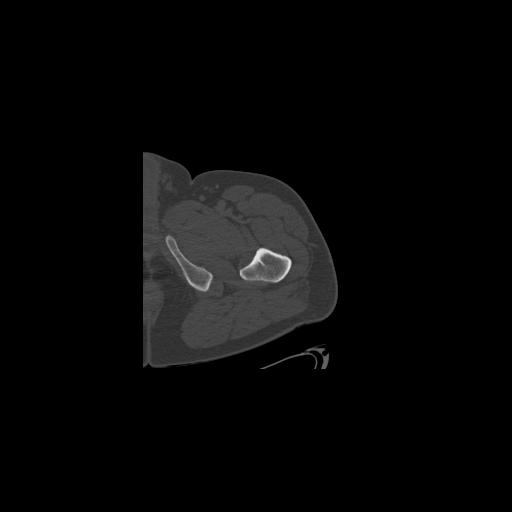

[Series 604: sagittal st · sagittal · 1.12mm/px · 5 of 121 slices shown, 6 images]
[im 41/121  bone]
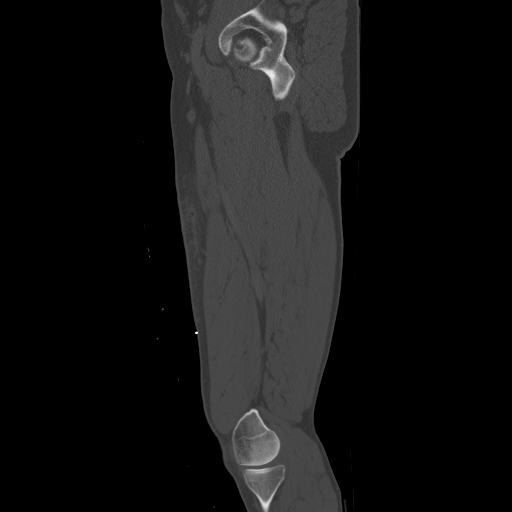
[im 51/121  bone]
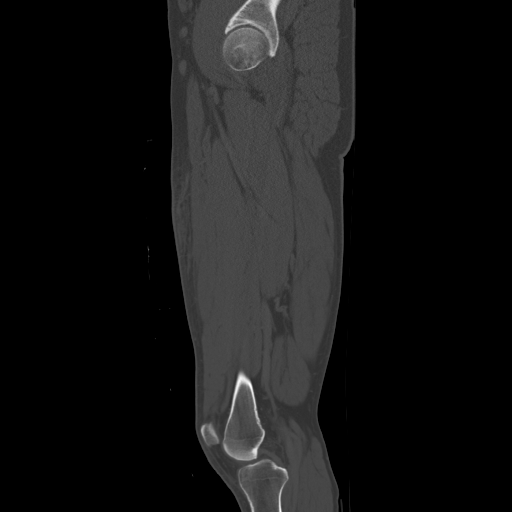
[im 61/121  soft-tissue]
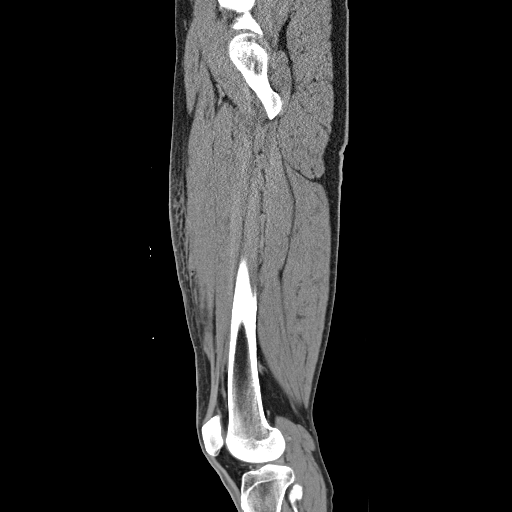
[im 61/121  bone]
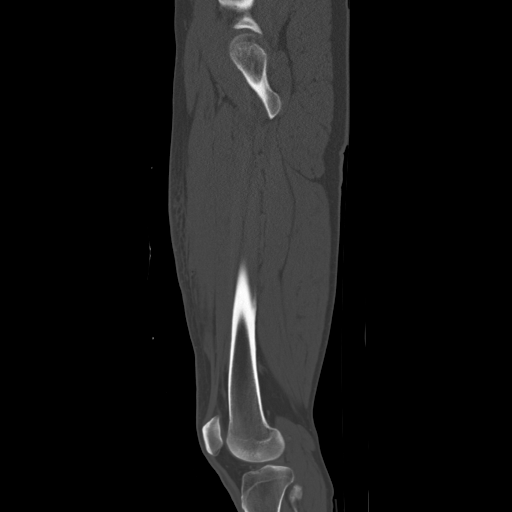
[im 71/121  bone]
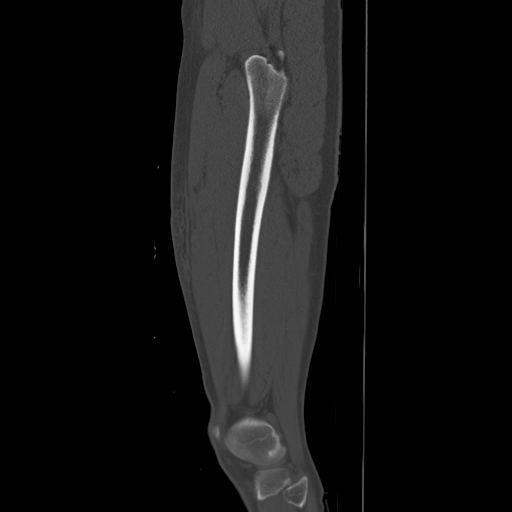
[im 81/121  bone]
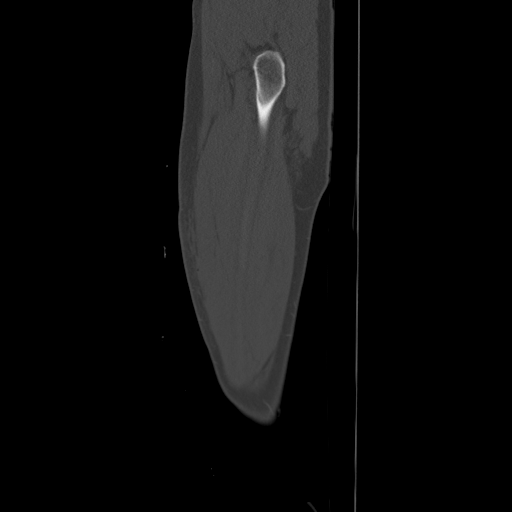

[16 of 33 positions shown; findings below may reference images not displayed]

FINDINGS: There is diffuse subcutaneous edema and interstitial
change involving entire anterior aspect of the upper and mid thigh
with associated mild skin thickening.  There are a few dots of air
along the anterior musculature which may be from recent injections.
No discrete abscess is identified.  No findings for myofasciitis or
osteomyelitis.  The left hip joint appears normal.
IMPRESSION: Localized cellulitis without findings for focal soft tissue
abscess, myofasciitis or osteomyelitis.

## 2013-01-23 ENCOUNTER — Ambulatory Visit (HOSPITAL_COMMUNITY): Payer: PRIVATE HEALTH INSURANCE

## 2013-01-23 ENCOUNTER — Encounter (HOSPITAL_COMMUNITY): Payer: Self-pay | Admitting: *Deleted

## 2013-01-23 ENCOUNTER — Emergency Department (HOSPITAL_COMMUNITY)
Admission: EM | Admit: 2013-01-23 | Discharge: 2013-01-23 | Disposition: A | Discharge status: Home / Self Care | Payer: PRIVATE HEALTH INSURANCE | Attending: Emergency Medicine | Admitting: Emergency Medicine

## 2013-01-23 DIAGNOSIS — R030 Elevated blood-pressure reading, without diagnosis of hypertension: Secondary | ICD-10-CM | POA: Insufficient documentation

## 2013-01-23 DIAGNOSIS — Z860100 Personal history of colon polyps, unspecified: Secondary | ICD-10-CM | POA: Insufficient documentation

## 2013-01-23 DIAGNOSIS — Z85820 Personal history of malignant melanoma of skin: Secondary | ICD-10-CM | POA: Insufficient documentation

## 2013-01-23 DIAGNOSIS — Z8719 Personal history of other diseases of the digestive system: Secondary | ICD-10-CM | POA: Insufficient documentation

## 2013-01-23 DIAGNOSIS — N23 Unspecified renal colic: Secondary | ICD-10-CM | POA: Insufficient documentation

## 2013-01-23 DIAGNOSIS — Z79899 Other long term (current) drug therapy: Secondary | ICD-10-CM | POA: Insufficient documentation

## 2013-01-23 DIAGNOSIS — Z9889 Other specified postprocedural states: Secondary | ICD-10-CM | POA: Insufficient documentation

## 2013-01-23 DIAGNOSIS — F341 Dysthymic disorder: Secondary | ICD-10-CM | POA: Insufficient documentation

## 2013-01-23 DIAGNOSIS — IMO0002 Reserved for concepts with insufficient information to code with codable children: Secondary | ICD-10-CM | POA: Insufficient documentation

## 2013-01-23 DIAGNOSIS — Z8679 Personal history of other diseases of the circulatory system: Secondary | ICD-10-CM | POA: Insufficient documentation

## 2013-01-23 DIAGNOSIS — R61 Generalized hyperhidrosis: Secondary | ICD-10-CM | POA: Insufficient documentation

## 2013-01-23 MED ORDER — OXYCODONE-ACETAMINOPHEN 5-325 MG PO TABS: 2.0000 | ORAL_TABLET | ORAL | Status: DC | PRN

## 2013-01-23 MED ORDER — HYDROMORPHONE HCL PF 1 MG/ML IJ SOLN
1.0000 mg | Freq: Once | INTRAMUSCULAR | Status: AC
  Administered 2013-01-23: 1 mg via INTRAMUSCULAR
  Filled 2013-01-23: qty 1

## 2013-01-23 NOTE — ED Notes (Signed)
Patient transported to CT. Pt continues to deny need to void, will monitor.

## 2013-01-23 NOTE — ED Provider Notes (Signed)
History     CSN: [id number]  Arrival date & time 01/23/13  0757   First MD Initiated Contact with Patient 01/23/13 [phone]      Chief Complaint  Patient presents with  . Groin Pain    left  . Flank Pain    left    (Consider location/radiation/quality/duration/timing/severity/associated sxs/prior treatment) HPI Comments: Patient brought to the ER by ambulance for evaluation of flank pain. Patient reports that he had sudden onset of severe, sharp, stabbing pain in the left flank and lower abdomen/groin area that started suddenly after urinating this morning. EMS reports that he was very hypertensive, diaphoretic upon their arrival. They're unable to establish an IV. Patient was given fentanyl 200 mcg IM and Toradol 30 mg IM and pain has not resolved. Patient has never had similar symptoms before. No fever.  Patient is a 52 y.o. male presenting with groin pain and flank pain.  Groin Pain  Flank Pain    Past Medical History  Diagnosis Date  . Internal hemorrhoids without mention of complication   . Metastatic malignant melanoma   . Colon polyp     adenomatous  . Anxiety and depression   . Esophagitis     severe-due to [facility] treatment  . Colitis     secondary to [facility] treatment  . Melanoma     melanoma dx 2007;    Past Surgical History  Procedure Laterality Date  . Melanoma excision  2008    Lymph node removal  . Shoulder surgery  2010  . Polypectomy      Family History  Problem Relation Age of Onset  . Diabetes      Grandmother  . Coronary artery disease      Grandfather  . Heart disease Maternal Grandmother   . Heart disease Maternal Grandfather     History  Substance Use Topics  . Smoking status: Former Games developer  . Smokeless tobacco: Never Used  . Alcohol Use: No      Review of Systems  Constitutional: Negative for fever.  Genitourinary: Positive for flank pain.  All other systems reviewed and are negative.    Allergies  Codeine and  Oxycodone  Home Medications   Current Outpatient Rx  Name  Route  Sig  Dispense  Refill  . gabapentin (NEURONTIN) 300 MG capsule   Oral   Take 1 capsule by mouth Three times a day.         . inFLIXimab (REMICADE) 100 MG injection      Weight- 190 lbs. DX: Colitis associated with Yervoy. Dosing instructions: 5 mg/kg IV over 2 hours on 04/06/11 (2nd dose- 1st dose was inpatient on 03/23/11) and again on 05/05/11 for 3rd dose.(30 days after 2nd dose). Pre medication: Benadryl 25-50 mg by mouth and/or Acetaminophen 650 mg by mouth.  Date of last TB skin test- 03/08/11. Result- Negative.   1 each   1   . LORazepam (ATIVAN) 1 MG tablet      Take 1/2-1 tab TID prn anxiety   30 tablet   0   . MOVIPREP 100 G SOLR      movi prep as directed   1 each   0     Dispense as written.   Marland Kitchen omeprazole (PRILOSEC) 20 MG capsule   Oral   Take 20 mg by mouth 2 (two) times daily.           . predniSONE (DELTASONE) 20 MG tablet   Oral   Take 20 mg  by mouth. Takes 10 mg once daily---tapering         . witch hazel-glycerin (TUCKS) pad   Rectal   Place rectally as needed.           . zolpidem (AMBIEN) 10 MG tablet   Oral   Take 1 tablet by mouth At bedtime as needed.           BP 140/84  Pulse 95  Temp(Src) 98.5 F (36.9 C)  Resp 20  SpO2 95%  Physical Exam  Constitutional: He is oriented to person, place, and time. He appears well-developed and well-nourished. No distress.  HENT:  Head: Normocephalic and atraumatic.  Right Ear: Hearing normal.  Nose: Nose normal.  Mouth/Throat: Oropharynx is clear and moist and mucous membranes are normal.  Eyes: Conjunctivae and EOM are normal. Pupils are equal, round, and reactive to light.  Neck: Normal range of motion. Neck supple.  Cardiovascular: Normal rate, regular rhythm, S1 normal and S2 normal.  Exam reveals no gallop and no friction rub.   No murmur heard. Pulmonary/Chest: Effort normal and breath sounds normal. No respiratory  distress. He exhibits no tenderness.  Abdominal: Soft. Normal appearance and bowel sounds are normal. There is no hepatosplenomegaly. There is no tenderness. There is no rebound, no guarding, no tenderness at McBurney's point and negative Murphy's sign. No hernia.  Musculoskeletal: Normal range of motion.  Neurological: He is alert and oriented to person, place, and time. He has normal strength. No cranial nerve deficit or sensory deficit. Coordination normal. GCS eye subscore is 4. GCS verbal subscore is 5. GCS motor subscore is 6.  Skin: Skin is warm, dry and intact. No rash noted. No cyanosis.  Psychiatric: He has a normal mood and affect. His speech is normal and behavior is normal. Thought content normal.    ED Course  Procedures (including critical care time)  Labs Reviewed  URINALYSIS, ROUTINE W REFLEX MICROSCOPIC   Ct Abdomen Pelvis Wo Contrast  01/23/2013  *RADIOLOGY REPORT*  Clinical Data: Left flank pain  CT ABDOMEN AND PELVIS WITHOUT CONTRAST  Technique:  Multidetector CT imaging of the abdomen and pelvis was performed following the standard protocol without intravenous contrast.  Comparison: Lung bases CT scan abdomen 02/24/2011  Findings: Lung bases shows a pleural-based nodular scarring left base posteromedially measures 8 mm decrease in size from prior exam.  A nodule right base centrally measures 6.4 mm stable in size from prior exam.  Follow-up examination in 1 year is recommended to assure stability.  Sagittal images of the spine shows mild disc space flattening at L3- L4 level.  Mild anterior spurring upper endplate of the L4 and lower endplate of L5 vertebral body.  There is nodular infiltrate or atelectasis in the right base posteriorly measures 1.3 cm.  Unenhanced liver shows no biliary ductal dilatation.  Unenhanced pancreas, spleen and adrenals are unremarkable.  Small accessory splenule.  There is minimal left hydronephrosis and left hydroureter.  Mild left perinephric  stranding.  No left nephrolithiasis.  Tiny punctate nonobstructive calcified calculus lower pole of the right kidney measures 2 mm.  Bilateral no proximal or mid ureteral calcifications are noted.  In axial image 78 there is a calcified calculus in the left UVJ/urinary bladder wall measures 3 mm.  Prostate gland and seminal vesicles are unremarkable.  No aortic aneurysm.  No calcified gallstones are noted within gallbladder.  No small bowel obstruction.  Stool noted in the right colon and cecum.  No pericecal inflammation.  No  ascites or free air.  No adenopathy.  No destructive bony lesions are noted within pelvis.  IMPRESSION:  1.  Pleural based noncalcified nodule probable scarring in the left lower lobe posteromedially measures 8 mm decrease in size from prior exam.  Stable noncalcified nodule in the right base centrally measures 6.4 mm.  Follow-up examination in 1 year is suggested to assure stability.  There is a nodular atelectasis or infiltrate in the right base posteriorly. 2.  Minimal left hydronephrosis and left hydroureter.  Mild perinephric stranding.  There is a calcified calculus in the left UVJ/urinary bladder wall measures 5.3 mm.  3.  Right nonobstructive nephrolithiasis. 4.  No small bowel obstruction.   Original Report Authenticated By: Natasha Mead, M.D.      Diagnosis: Renal colic due to left UVJ stone    MDM  Patient presents to the ER for evaluation of sudden onset left flank pain. Patient's pain was severe initially causing significant hypertension and mild tachycardia. Patient's pain did resolve, however, by administration of medication by EMS. CAT scan does confirm left UVJ stone causing the patient's pain. He started to have a small amount of pain coming back. Patient administered IM Dilaudid and will be discharged with analgesia consisting of Percocet. Followup with urology. Patient counseled to return to the ER for uncontrolled pain.       Gilda Crease,  MD 01/23/13 0930

## 2013-01-23 NOTE — ED Notes (Signed)
WGN:[id number]<HY> Expected date:<BR> Expected time:<BR> Means of arrival:Ambulance<BR> Comments:<BR> M41- leg pain, diaphoretic

## 2013-01-23 NOTE — ED Notes (Signed)
Per EMS, pt from home with reports of sudden, severe left groin pain radiating to left flank that started this morning after urinating. EMS reports were unable to obtain IV so gave IM Fentanyl and 30mg  IM Toradol with relief. EMS reports that upon arrival to pt's home, pt was hypertensive and sweating, BP was 200/110 and HR was around 100. Dr. Blinda Leatherwood at bedside at [phone].

## 2013-04-16 ENCOUNTER — Encounter (HOSPITAL_COMMUNITY): Payer: Self-pay | Admitting: *Deleted

## 2013-04-16 ENCOUNTER — Emergency Department (HOSPITAL_COMMUNITY): Payer: PRIVATE HEALTH INSURANCE

## 2013-04-16 ENCOUNTER — Emergency Department (HOSPITAL_COMMUNITY)
Admission: EM | Admit: 2013-04-16 | Discharge: 2013-04-16 | Disposition: A | Discharge status: Home / Self Care | Payer: PRIVATE HEALTH INSURANCE | Attending: Emergency Medicine | Admitting: Emergency Medicine

## 2013-04-16 DIAGNOSIS — J9383 Other pneumothorax: Secondary | ICD-10-CM | POA: Insufficient documentation

## 2013-04-16 DIAGNOSIS — Z85820 Personal history of malignant melanoma of skin: Secondary | ICD-10-CM | POA: Insufficient documentation

## 2013-04-16 DIAGNOSIS — F341 Dysthymic disorder: Secondary | ICD-10-CM | POA: Insufficient documentation

## 2013-04-16 DIAGNOSIS — J95811 Postprocedural pneumothorax: Secondary | ICD-10-CM

## 2013-04-16 DIAGNOSIS — R079 Chest pain, unspecified: Secondary | ICD-10-CM | POA: Insufficient documentation

## 2013-04-16 DIAGNOSIS — Z860100 Personal history of colon polyps, unspecified: Secondary | ICD-10-CM | POA: Insufficient documentation

## 2013-04-16 DIAGNOSIS — Z8719 Personal history of other diseases of the digestive system: Secondary | ICD-10-CM | POA: Insufficient documentation

## 2013-04-16 DIAGNOSIS — Z87891 Personal history of nicotine dependence: Secondary | ICD-10-CM | POA: Insufficient documentation

## 2013-04-16 LAB — BASIC METABOLIC PANEL
BUN: 8 mg/dL (ref 6–23)
CO2: 24 mEq/L (ref 19–32)
Calcium: 9.3 mg/dL (ref 8.4–10.5)
Chloride: 107 mEq/L (ref 96–112)
Creatinine, Ser: 0.76 mg/dL (ref 0.50–1.35)
GFR calc Af Amer: >90 mL/min (ref >90)
GFR calc non Af Amer: >90 mL/min (ref >90)
Glucose, Bld: 100 mg/dL — ABNORMAL HIGH (ref 70–99)
Potassium: 3.8 mEq/L (ref 3.5–5.1)
Sodium: 143 mEq/L (ref 135–145)

## 2013-04-16 LAB — CBC
HCT: 46.3 % (ref 39.0–52.0)
Hemoglobin: 15.9 g/dL (ref 13.0–17.0)
MCH: 29.7 pg (ref 26.0–34.0)
MCHC: 34.3 g/dL (ref 30.0–36.0)
MCV: 86.5 fL (ref 78.0–100.0)
Platelets: 179 10*3/uL (ref 150–400)
RBC: 5.35 MIL/uL (ref 4.22–5.81)
RDW: 13.5 % (ref 11.5–15.5)
WBC: 9.2 10*3/uL (ref 4.0–10.5)

## 2013-04-16 LAB — TROPONIN I: Troponin I: <0.3 ng/mL (ref <0.30)

## 2013-04-16 MED ORDER — FENTANYL CITRATE 0.05 MG/ML IJ SOLN
50.0000 ug | INTRAMUSCULAR | Status: DC | PRN
  Administered 2013-04-16: 50 ug via INTRAVENOUS
  Filled 2013-04-16: qty 2

## 2013-04-16 MED ORDER — NAPROXEN 500 MG PO TABS: 500.0000 mg | ORAL_TABLET | Freq: Two times a day (BID) | ORAL | Status: DC

## 2013-04-16 MED ORDER — KETOROLAC TROMETHAMINE 30 MG/ML IJ SOLN
30.0000 mg | Freq: Once | INTRAMUSCULAR | Status: AC
  Administered 2013-04-16: 30 mg via INTRAVENOUS
  Filled 2013-04-16: qty 1

## 2013-04-16 NOTE — ED Provider Notes (Signed)
History    CSN: [id number] Arrival date & time 04/16/13  4098  First MD Initiated Contact with Patient 04/16/13 1013     Chief Complaint  Patient presents with  . Shortness of Breath  . Chest Pain   (Consider location/radiation/quality/duration/timing/severity/associated sxs/prior Treatment) HPI Comments: Pt has hx of melanoma who had a biopsy of the R lower lung yesterday which was done at [facility] - he developed acute onset of pain in the R chest this AM at 8 AM - this is constant, sharp and stabbing, worse with deep breathing and associated with SOB.  He is not a smoker.    Patient is a 52 y.o. male presenting with shortness of breath and chest pain. The history is provided by the patient.  Shortness of Breath Associated symptoms: chest pain   Chest Pain Associated symptoms: shortness of breath    Past Medical History  Diagnosis Date  . Internal hemorrhoids without mention of complication   . Metastatic malignant melanoma   . Colon polyp     adenomatous  . Anxiety and depression   . Esophagitis     severe-due to [facility] treatment  . Colitis     secondary to [facility] treatment  . Melanoma     melanoma dx 2007;   Past Surgical History  Procedure Laterality Date  . Melanoma excision  2008    Lymph node removal  . Shoulder surgery  2010  . Polypectomy     Family History  Problem Relation Age of Onset  . Diabetes      Grandmother  . Coronary artery disease      Grandfather  . Heart disease Maternal Grandmother   . Heart disease Maternal Grandfather    History  Substance Use Topics  . Smoking status: Former Games developer  . Smokeless tobacco: Never Used  . Alcohol Use: No    Review of Systems  Respiratory: Positive for shortness of breath.   Cardiovascular: Positive for chest pain.  All other systems reviewed and are negative.    Allergies  Codeine and Oxycodone  Home Medications   Current Outpatient Rx  Name  Route  Sig  Dispense  Refill  .  gabapentin (NEURONTIN) 300 MG capsule   Oral   Take 1 capsule by mouth daily.          Marland Kitchen MOVIPREP 100 G SOLR      movi prep as directed   1 each   0     Dispense as written.   . naproxen (NAPROSYN) 500 MG tablet   Oral   Take 1 tablet (500 mg total) by mouth 2 (two) times daily with a meal.   30 tablet   0    BP 131/91  Pulse 75  Resp 17  SpO2 100% Physical Exam  Nursing note and vitals reviewed. Constitutional: He appears well-developed and well-nourished.  Uncomfortable appearing  HENT:  Head: Normocephalic and atraumatic.  Mouth/Throat: Oropharynx is clear and moist. No oropharyngeal exudate.  Eyes: Conjunctivae and EOM are normal. Pupils are equal, round, and reactive to light. Right eye exhibits no discharge. Left eye exhibits no discharge. No scleral icterus.  Neck: Normal range of motion. Neck supple. No JVD present. No thyromegaly present.  Cardiovascular: Normal rate, regular rhythm, normal heart sounds and intact distal pulses.  Exam reveals no gallop and no friction rub.   No murmur heard. Pulmonary/Chest: Effort normal and breath sounds normal. No respiratory distress. He has no wheezes. He has no rales.  He exhibits no tenderness.  Abdominal: Soft. Bowel sounds are normal. He exhibits no distension and no mass. There is no tenderness.  Musculoskeletal: Normal range of motion. He exhibits no edema and no tenderness.  Lymphadenopathy:    He has no cervical adenopathy.  Neurological: He is alert. Coordination normal.  Skin: Skin is warm and dry. No rash noted. No erythema.  Psychiatric: He has a normal mood and affect. His behavior is normal.    ED Course  Procedures (including critical care time) Labs Reviewed  BASIC METABOLIC PANEL - Abnormal; Notable for the following:    Glucose, Bld 100 (*)    All other components within normal limits  CBC  TROPONIN I   Dg Chest Port 1 View  04/16/2013   *RADIOLOGY REPORT*  Clinical Data: Right lower lobe lung  biopsy yesterday at [facility].  Follow up right apical pneumothorax.  PORTABLE CHEST - 1 VIEW 04/16/2013 1456 hours:  Comparison: Portable chest x-ray earlier same date 1016 hours.  Findings: Stable small (5-10%) right apical pneumothorax since examination earlier today. Interval development of linear atelectasis in both lung bases since the earlier examination.  No new pulmonary parenchymal abnormalities elsewhere.  Cardiac silhouette upper normal in size to slightly enlarged for technique.  IMPRESSION: Stable small (5-10%) right apical pneumothorax since the examination earlier same date.  Interval development of linear atelectasis in the lung bases.   Original Report Authenticated By: Hulan Saas, M.D.   Dg Chest Port 1 View  04/16/2013   *RADIOLOGY REPORT*  Clinical Data: Short of breath.  Chest pain.  Lung biopsy 1 day ago.  PORTABLE CHEST - 1 VIEW  Comparison: None.  Findings: Small right apical pneumothorax is present.  This projects over the right posterior third and fourth ribs.  Critical Value/emergent results were called by telephone at the time of interpretation on 04/16/2013 at 1059 hours to Dr. Hyacinth Meeker, who verbally acknowledged these results.  Cardiopericardial silhouette appears within normal limits.  Left axillary dissection clips are present. Monitoring leads are projected over the chest.  Pulmonary nodules seen on prior CT at the lung bases are not well visualized on this portable chest radiograph.  IMPRESSION: Small right apical pneumothorax estimated at 10% status post recent biopsy at [facility].   Original Report Authenticated By: Andreas Newport, M.D.   1. Pneumothorax after biopsy     MDM  Has pain with deep breathing, lung sounds are normal but he has limited deep breathing b/c of pain in the R hemithorax.  There is no bleeding or sub Q emphysema around the biopsy site.  VS are normal other than sat's that range from 92-100%, seem to be effort  driven.  ECG normal, no tachycardia.  ED ECG REPORT  I personally interpreted this EKG   Date: 04/16/2013   Rate: 80  Rhythm: normal sinus rhythm  QRS Axis: normal  Intervals: normal  ST/T Wave abnormalities: nonspecific T wave changes  Conduction Disutrbances:nonspecific intraventricular conduction delay  Narrative Interpretation:   Old EKG Reviewed: c/w 01/01/11, no sig changes  I have personally reviewed and interpretted the CXR - portable AP - shows small 20% ptx.  D/w radiology who agrees.  Consult with CT surgery  - pt still appears stable.  D/w Dr. Laneta Simmers in the OR via his OR nurse - repeat CXR at 4 hours - f/u in office this week if no change  3:37 PM Repeat CXR unchanged - pt improved clinically - stable for d/c.  Vida Roller, MD 04/16/13 1537

## 2013-04-16 NOTE — ED Notes (Signed)
Pt reports having lung biopsy done yesterday today he developed right sided chest pain and shortness of breath. Pt's O2 sats are 92-96 % on RA, pain is 10/10.

## 2013-04-24 ENCOUNTER — Ambulatory Visit: Payer: PRIVATE HEALTH INSURANCE | Admitting: Surgery

## 2013-10-30 ENCOUNTER — Encounter: Payer: Self-pay | Admitting: Internal Medicine

## 2014-03-26 ENCOUNTER — Encounter: Payer: Self-pay | Admitting: Internal Medicine

## 2014-03-28 ENCOUNTER — Encounter: Payer: Self-pay | Admitting: Internal Medicine

## 2014-06-06 ENCOUNTER — Ambulatory Visit (AMBULATORY_SURGERY_CENTER): Payer: Self-pay

## 2014-06-06 VITALS — Ht 73.0 in | Wt 210.2 lb

## 2014-06-06 DIAGNOSIS — Z86010 Personal history of colonic polyps: Secondary | ICD-10-CM

## 2014-06-06 MED ORDER — MOVIPREP 100 G PO SOLR: ORAL | Status: DC

## 2014-06-06 NOTE — Progress Notes (Signed)
Per pt, no allergies to soy or egg products.Pt not taking any weight loss meds or using  O2 at home. 

## 2014-06-11 ENCOUNTER — Encounter: Payer: Self-pay | Admitting: Internal Medicine

## 2014-06-17 HISTORY — PX: AXILLARY LYMPH NODE BIOPSY: SHX5737

## 2014-06-20 ENCOUNTER — Other Ambulatory Visit: Payer: Self-pay | Admitting: Internal Medicine

## 2014-06-20 ENCOUNTER — Encounter: Payer: Self-pay | Admitting: Internal Medicine

## 2014-06-20 ENCOUNTER — Ambulatory Visit (AMBULATORY_SURGERY_CENTER): Payer: BC Managed Care – PPO | Admitting: Internal Medicine

## 2014-06-20 VITALS — BP 134/82 | HR 60 | Temp 96.8°F | Resp 19 | Ht 73.0 in | Wt 210.0 lb

## 2014-06-20 DIAGNOSIS — K625 Hemorrhage of anus and rectum: Secondary | ICD-10-CM

## 2014-06-20 DIAGNOSIS — Z86010 Personal history of colonic polyps: Secondary | ICD-10-CM

## 2014-06-20 DIAGNOSIS — D126 Benign neoplasm of colon, unspecified: Secondary | ICD-10-CM

## 2014-06-20 DIAGNOSIS — K648 Other hemorrhoids: Secondary | ICD-10-CM

## 2014-06-20 DIAGNOSIS — D125 Benign neoplasm of sigmoid colon: Secondary | ICD-10-CM

## 2014-06-20 MED ORDER — SODIUM CHLORIDE 0.9 % IV SOLN: 500.0000 mL | INTRAVENOUS | Status: DC

## 2014-06-20 MED ORDER — HYDROCORTISONE ACETATE 25 MG RE SUPP: 25.0000 mg | Freq: Every evening | RECTAL | Status: DC | PRN

## 2014-06-20 NOTE — Progress Notes (Signed)
Procedure ends, to recovery, report given and VSS. 

## 2014-06-20 NOTE — Op Note (Signed)
[place]  [address] [place], [address]   COLONOSCOPY PROCEDURE REPORT  PATIENT: Kacey, Vicuna  MR#: [id number] BIRTHDATE: 1961-05-18 , 61  yrs. old GENDER: Male ENDOSCOPIST: [place] Dragon, MD REFERRED JF:[id number] Little, M.D. PROCEDURE DATE:  06/20/2014 PROCEDURE:   Colonoscopy with cold biopsy polypectomy First Screening Colonoscopy - Avg.  risk and is 50 yrs.  old or older - No.  Prior Negative Screening - Now for repeat screening. N/A  History of Adenoma - Now for follow-up colonoscopy & has been > or = to 3 yrs.  Yes hx of adenoma.  Has been 3 or more years since last colonoscopy.  Polyps Removed Today? Yes. ASA CLASS:   Class III INDICATIONS:Rectal Bleeding , hamartoma removed in 2008, tubular adenoma removed in January 2010.  Left sided colitis secondary to CTLA Hilaria Ota)  for advanced melanoma.  treated with Remicade. Currently doing well. MEDICATIONS: MAC sedation, administered by CRNA and Propofol (Diprivan) 340 mg IV  DESCRIPTION OF PROCEDURE:   After the risks benefits and alternatives of the procedure were thoroughly explained, informed consent was obtained.  A digital rectal exam revealed no abnormalities of the rectum.   The LB [id number]  endoscope was introduced through the anus and advanced to the cecum, which was identified by both the appendix and ileocecal valve. No adverse events experienced.   The quality of the prep was excellent, using MoviPrep  The instrument was then slowly withdrawn as the colon was fully examined.      COLON FINDINGS: Mild diverticulosis was noted in the sigmoid colon. Moderate sized internal hemorrhoids were found.   A diminutive flat polyp was found in the sigmoid colon.  A polypectomy was performed with cold forceps.  The resection was complete and the polyp tissue was completely retrieved.  Retroflexed views revealed no abnormalities. The time to cecum=6 minutes 09 seconds. Withdrawal time=8  minutes 54 seconds.  The scope was withdrawn and the procedure completed. COMPLICATIONS: There were no complications.  ENDOSCOPIC IMPRESSION: 1.   Mild diverticulosis was noted in the sigmoid colon 2.   Moderate sized internal hemorrhoids 3.   Diminutive flat polyp was found in the sigmoid colon at 25 cm,; polypectomy was performed with cold forceps 4 . no evidence of metastatic melanoma bleeding likely occurred from internal hemorrhoids  RECOMMENDATIONS: 1.  Await pathology results 2.  High fiber diet Anusol-HC suppositories each bedtime 3. recall colonoscopy pending path report  eSigned:  L[place] Dragon, MD 06/20/2014 9:46 AM   cc:   PATIENT NAME:  Revel, Stellmach MR#: [id number]

## 2014-06-20 NOTE — Patient Instructions (Addendum)
YOU HAD AN ENDOSCOPIC PROCEDURE TODAY AT THE [facility]: Refer to the procedure report that was given to you for any specific questions about what was found during the examination.  If the procedure report does not answer your questions, please call your gastroenterologist to clarify.  If you requested that your care partner not be given the details of your procedure findings, then the procedure report has been included in a sealed envelope for you to review at your convenience later.  YOU SHOULD EXPECT: Some feelings of bloating in the abdomen. Passage of more gas than usual.  Walking can help get rid of the air that was put into your GI tract during the procedure and reduce the bloating. If you had a lower endoscopy (such as a colonoscopy or flexible sigmoidoscopy) you may notice spotting of blood in your stool or on the toilet paper. If you underwent a bowel prep for your procedure, then you may not have a normal bowel movement for a few days.  DIET: Your first meal following the procedure should be a light meal and then it is ok to progress to your normal diet.  A half-sandwich or bowl of soup is an example of a good first meal.  Heavy or fried foods are harder to digest and may make you feel nauseous or bloated.  Likewise meals heavy in dairy and vegetables can cause extra gas to form and this can also increase the bloating.  Drink plenty of fluids but you should avoid alcoholic beverages for 24 hours.  ACTIVITY: Your care partner should take you home directly after the procedure.  You should plan to take it easy, moving slowly for the rest of the day.  You can resume normal activity the day after the procedure however you should NOT DRIVE or use heavy machinery for 24 hours (because of the sedation medicines used during the test).    SYMPTOMS TO REPORT IMMEDIATELY: A gastroenterologist can be reached at any hour.  During normal business hours, 8:30 AM to 5:00 PM Monday through Friday,  call [phone].  After hours and on weekends, please call the GI answering service at [phone] who will take a message and have the physician on call contact you.   Following lower endoscopy (colonoscopy or flexible sigmoidoscopy):  Excessive amounts of blood in the stool  Significant tenderness or worsening of abdominal pains  Swelling of the abdomen that is new, acute  Fever of 100F or higher    FOLLOW UP: If any biopsies were taken you will be contacted by phone or by letter within the next 1-3 weeks.  Call your gastroenterologist if you have not heard about the biopsies in 3 weeks.  Our staff will call the home number listed on your records the next business day following your procedure to check on you and address any questions or concerns that you may have at that time regarding the information given to you following your procedure. This is a courtesy call and so if there is no answer at the home number and we have not heard from you through the emergency physician on call, we will assume that you have returned to your regular daily activities without incident.  SIGNATURES/CONFIDENTIALITY: You and/or your care partner have signed paperwork which will be entered into your electronic medical record.  These signatures attest to the fact that that the information above on your After Visit Summary has been reviewed and is understood.  Full responsibility of the confidentiality   of this discharge information lies with you and/or your care-partner.   INFORMATION ON POLYPS ,DIVERTICULOSIS ,HEMORRHOIDS,AND HIGH FIBER DIET GIVEN TO YOU TODAY  ANUSOL HC SUPPOSITORIES RX GIVEN TO YOU BY DR BRODIE  Diverticulosis Diverticulosis is the condition that develops when small pouches (diverticula) form in the wall of your colon. Your colon, or large intestine, is where water is absorbed and stool is formed. The pouches form when the inside layer of your colon pushes through weak spots in the  outer layers of your colon. CAUSES  No one knows exactly what causes diverticulosis. RISK FACTORS  Being older than 54. Your risk for this condition increases with age. Diverticulosis is rare in people younger than 40 years. By age 52, almost everyone has it.  Eating a low-fiber diet.  Being frequently constipated.  Being overweight.  Not getting enough exercise.  Smoking.  Taking over-the-counter pain medicines, like aspirin and ibuprofen. SYMPTOMS  Most people with diverticulosis do not have symptoms. DIAGNOSIS  Because diverticulosis often has no symptoms, health care providers often discover the condition during an exam for other colon problems. In many cases, a health care provider will diagnose diverticulosis while using a flexible scope to examine the colon (colonoscopy). TREATMENT  If you have never developed an infection related to diverticulosis, you may not need treatment. If you have had an infection before, treatment may include:  Eating more fruits, vegetables, and grains.  Taking a fiber supplement.  Taking a live bacteria supplement (probiotic).  Taking medicine to relax your colon. HOME CARE INSTRUCTIONS   Drink at least 6-8 glasses of water each day to prevent constipation.  Try not to strain when you have a bowel movement.  Keep all follow-up appointments. If you have had an infection before:  Increase the fiber in your diet as directed by your health care provider or dietitian.  Take a dietary fiber supplement if your health care provider approves.  Only take medicines as directed by your health care provider. SEEK MEDICAL CARE IF:   You have abdominal pain.  You have bloating.  You have cramps.  You have not gone to the bathroom in 3 days. SEEK IMMEDIATE MEDICAL CARE IF:   Your pain gets worse.  Yourbloating becomes very bad.  You have a fever or chills, and your symptoms suddenly get worse.  You begin vomiting.  You have bowel  movements that are bloody or black. MAKE SURE YOU:  Understand these instructions.  Will watch your condition.  Will get help right away if you are not doing well or get worse. Document Released: 07/07/2004 Document Revised: 10/15/2013 Document Reviewed: 09/04/2013 [facility] Patient Information 2015 [place], [place]. This information is not intended to replace advice given to you by your health care provider. Make sure you discuss any questions you have with your health care provider.

## 2014-06-20 NOTE — Progress Notes (Signed)
Called to room to assist during endoscopic procedure.  Patient ID and intended procedure confirmed with present staff. Received instructions for my participation in the procedure from the performing physician.  

## 2014-06-23 ENCOUNTER — Telehealth: Payer: Self-pay

## 2014-06-23 NOTE — Telephone Encounter (Signed)
Left message on answering machine. 

## 2014-06-24 ENCOUNTER — Encounter: Payer: Self-pay | Admitting: Internal Medicine

## 2014-06-25 ENCOUNTER — Encounter: Payer: Self-pay | Admitting: *Deleted

## 2014-06-29 ENCOUNTER — Encounter: Payer: Self-pay | Admitting: Internal Medicine

## 2014-06-30 ENCOUNTER — Encounter (HOSPITAL_COMMUNITY): Payer: Self-pay | Admitting: Emergency Medicine

## 2014-06-30 ENCOUNTER — Emergency Department (HOSPITAL_COMMUNITY): Payer: BC Managed Care – PPO

## 2014-06-30 ENCOUNTER — Emergency Department (HOSPITAL_COMMUNITY)
Admission: EM | Admit: 2014-06-30 | Discharge: 2014-06-30 | Disposition: A | Discharge status: Home / Self Care | Payer: BC Managed Care – PPO | Attending: Emergency Medicine | Admitting: Emergency Medicine

## 2014-06-30 DIAGNOSIS — IMO0002 Reserved for concepts with insufficient information to code with codable children: Secondary | ICD-10-CM | POA: Insufficient documentation

## 2014-06-30 DIAGNOSIS — Z8659 Personal history of other mental and behavioral disorders: Secondary | ICD-10-CM | POA: Insufficient documentation

## 2014-06-30 DIAGNOSIS — M542 Cervicalgia: Secondary | ICD-10-CM | POA: Insufficient documentation

## 2014-06-30 DIAGNOSIS — Z8679 Personal history of other diseases of the circulatory system: Secondary | ICD-10-CM | POA: Diagnosis not present

## 2014-06-30 DIAGNOSIS — Z860100 Personal history of colon polyps, unspecified: Secondary | ICD-10-CM | POA: Insufficient documentation

## 2014-06-30 DIAGNOSIS — Z85819 Personal history of malignant neoplasm of unspecified site of lip, oral cavity, and pharynx: Secondary | ICD-10-CM | POA: Diagnosis not present

## 2014-06-30 DIAGNOSIS — Z87891 Personal history of nicotine dependence: Secondary | ICD-10-CM | POA: Diagnosis not present

## 2014-06-30 DIAGNOSIS — Z872 Personal history of diseases of the skin and subcutaneous tissue: Secondary | ICD-10-CM | POA: Diagnosis not present

## 2014-06-30 DIAGNOSIS — Z8719 Personal history of other diseases of the digestive system: Secondary | ICD-10-CM | POA: Diagnosis not present

## 2014-06-30 DIAGNOSIS — I889 Nonspecific lymphadenitis, unspecified: Secondary | ICD-10-CM

## 2014-06-30 MED ORDER — IOHEXOL 300 MG/ML  SOLN
100.0000 mL | Freq: Once | INTRAMUSCULAR | Status: AC | PRN
  Administered 2014-06-30: 100 mL via INTRAVENOUS

## 2014-06-30 NOTE — ED Notes (Signed)
Pt states he had cancer a couple of years ago and had lymph nodes removed from the left side  Pt states Saturday he started having tenderness and swelling to the left side of his neck under his jaw area   Pt states he went to urgent care and was started on antibiotics  Pt states the pain and swelling is not getting any better and they told him at urgent care if he did not notice improvement to come to the hospital for xrays or a CT

## 2014-06-30 NOTE — ED Provider Notes (Signed)
CSN: [id number]     Arrival date & time 06/30/14  0608 History   First MD Initiated Contact with Patient 06/30/14 [id number]     Chief Complaint  Patient presents with  . Neck Pain      HPI  Swelling in the left upper neck under his jaw. Present for about 10 days now. Symptoms started the day after he had a colonoscopy. He underwent sedation, but not intubation for his procedure. He was otherwise uncomplicated. Seen at urgent care. He had "cold symptoms" and some pain under his jaw. Was given a cough suppressant. Symptoms persisted. Seen on Saturday, 2 days ago at her daycare. Has a palpable tenderness. Placed on Augmentin. As the swelling and possible enlarged lymph node persists. He still painful. He presents here.  Negative history for a left upper extremity, left shoulder melanoma 10 years ago. He underwent resection and excision. He had one left axillary lymph node. He underwent targeted T. helper cell treatment at [place]. He had no distal site reactions to this. 2 years ago he developed pain in his throat and enlarged lymph nodes on the same side. 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 He underwent left radical neck dissection lymph node excision. None were positive for melanoma or HPV.  He follows with Dr. Julien Nordmann here locally. He follows at [facility] oncology as well. He gets every 3 months PET scans and has been negative so far.  No fever.  No painful teeth, mild sore throat.  Negative strep at [facility] 10 days ago. No other areas of lymphadenopathy.  No fever, chills, night sweats, no weight loss.  Past Medical History  Diagnosis Date  . Internal hemorrhoids without mention of complication   . Metastatic malignant melanoma   . Colon polyp     adenomatous  . Anxiety and depression   . Esophagitis     severe-due to [facility] treatment  . Colitis     secondary to [facility] treatment  . Melanoma     melanoma dx 2007;  . Bursitis     left shoulder  . History of throat cancer   . Colitis    Past Surgical  History  Procedure Laterality Date  . Melanoma excision  2008/2015    Lymph node removal/neck and groin  . Shoulder surgery  2010    left shoulder  . Polypectomy    . Radiation treatment      7 weeks in 2013  . Axillary lymph node biopsy Right 06/17/14    hx. melanoma 2 spots bx.   Family History  Problem Relation Age of Onset  . Diabetes      Grandmother  . Coronary artery disease      Grandfather  . Heart disease Maternal Grandmother   . Heart disease Maternal Grandfather    History  Substance Use Topics  . Smoking status: Former Smoker    Types: Cigarettes    Quit date: 06/07/1979  . Smokeless tobacco: Never Used  . Alcohol Use: No    Review of Systems  Constitutional: Negative for fever, chills, diaphoresis, appetite change and fatigue.  HENT: Negative for mouth sores, sore throat and trouble swallowing.        Pain and STS under lt jaw.   Eyes: Negative for visual disturbance.  Respiratory: Negative for cough, chest tightness, shortness of breath and wheezing.   Cardiovascular: Negative for chest pain.  Gastrointestinal: Negative for nausea, vomiting, abdominal pain, diarrhea and abdominal distention.  Endocrine: Negative for polydipsia, polyphagia and polyuria.  Genitourinary: Negative for dysuria, frequency and hematuria.  Musculoskeletal: Negative for gait problem.  Skin: Negative for color change, pallor and rash.  Neurological: Negative for dizziness, syncope, light-headedness and headaches.  Hematological: Does not bruise/bleed easily.  Psychiatric/Behavioral: Negative for behavioral problems and confusion.      Allergies  Codeine and Oxycodone  Home Medications   Prior to Admission medications   Medication Sig Start Date End Date Taking? Authorizing Provider  hydrocortisone (ANUSOL-HC) 25 MG suppository Place 1 suppository (25 mg total) rectally at bedtime as needed for hemorrhoids or itching. 06/20/14  Yes Lafayette Dragon, MD  Multiple Vitamin  (MULTIVITAMIN) tablet Take 1 tablet by mouth daily.   Yes Historical Provider, MD  oxyCODONE-acetaminophen (PERCOCET) 10-325 MG per tablet Take 1 tablet by mouth every 4 (four) hours as needed for pain.   Yes Historical Provider, MD   BP 133/85  Pulse 72  Temp(Src) 98.7 F (37.1 C) (Oral)  Resp 18  SpO2 100% Physical Exam  Constitutional: He is oriented to person, place, and time. He appears well-developed and well-nourished. No distress.  HENT:  Head: Normocephalic.    Mouth/Throat:    Eyes: Conjunctivae are normal. Pupils are equal, round, and reactive to light. No scleral icterus.  Neck: Normal range of motion. Neck supple. No thyromegaly present.  Cardiovascular: Normal rate and regular rhythm.  Exam reveals no gallop and no friction rub.   No murmur heard. Pulmonary/Chest: Effort normal and breath sounds normal. No respiratory distress. He has no wheezes. He has no rales.  Abdominal: Soft. Bowel sounds are normal. He exhibits no distension. There is no tenderness. There is no rebound.  Musculoskeletal: Normal range of motion.       Back:  Neurological: He is alert and oriented to person, place, and time.  Skin: Skin is warm and dry. No rash noted.  Psychiatric: He has a normal mood and affect. His behavior is normal.    ED Course  Procedures (including critical care time) Labs Review Labs Reviewed - No data to display  Imaging Review Ct Soft Tissue Neck W Contrast  06/30/2014   CLINICAL DATA:  Swelling and tenderness of the left neck near the mandible. History of squamous cell carcinoma of the head and neck and prior left neck dissection. Additional prior history of melanoma.  EXAM: CT NECK WITH CONTRAST  TECHNIQUE: Multidetector CT imaging of the neck was performed using the standard protocol following the bolus administration of intravenous contrast.  CONTRAST:  125mL OMNIPAQUE IOHEXOL 300 MG/ML  SOLN  COMPARISON:  12/06/2010 and PET scan on 04/11/2011.  FINDINGS: No  evidence of focal mass or enlarged lymph nodes. Some small cervical nodes and submandibular nodes are identified. Postsurgical changes are seen after left neck dissection. No evidence of inflammatory process or abscess. No vascular abnormalities are identified. The visualized airway is normally patent. No mucosal mass lesions are identified in the pharynx or larynx. Visualized paranasal sinuses and mastoid air cells are normally aerated. Salivary glands are symmetric and show no evidence of enlargement or inflammation.  IMPRESSION: No evidence of neck masses or enlarged lymph nodes. No inflammatory process identified.   Electronically Signed   By: Aletta Edouard M.D.   On: 06/30/2014 08:27     EKG Interpretation None      MDM   Final diagnoses:  Cervical adenitis    CT scan is reassuring. No uptake of contrast for an enhancing mass, abscess, or abnormal lymph nodes.  Think this is very likely an  adenitis. However considering his history of vascular recheck with his oncologist for repeat scanning if not completely resolved by completion of antibiotics.    Tanna Furry, MD 06/30/14 [phone]

## 2014-06-30 NOTE — Discharge Instructions (Signed)
Finish the Augmentin prescription. Recheck with your oncologist at [facility] if you're lymph nodes not resolved by the time you complete antibiotics.  Cervical Adenitis You have a swollen lymph gland in your neck. This commonly happens with Strep and virus infections, dental problems, insect bites, and injuries about the face, scalp, or neck. The lymph glands swell as the body fights the infection or heals the injury. Swelling and firmness typically lasts for several weeks after the infection or injury is healed. Rarely lymph glands can become swollen because of cancer or TB. Antibiotics are prescribed if there is evidence of an infection. Sometimes an infected lymph gland becomes filled with pus. This condition may require opening up the abscessed gland by draining it surgically. Most of the time infected glands return to normal within two weeks. Do not poke or squeeze the swollen lymph nodes. That may keep them from shrinking back to their normal size. If the lymph gland is still swollen after 2 weeks, further medical evaluation is needed.  SEEK IMMEDIATE MEDICAL CARE IF:  You have difficulty swallowing or breathing, increased swelling, severe pain, or a high fever.  Document Released: 10/10/2005 Document Revised: 01/02/2012 Document Reviewed: 04/01/2007 [facility] Patient Information 2015 [place], [place]. This information is not intended to replace advice given to you by your health care provider. Make sure you discuss any questions you have with your health care provider.  Lymphadenopathy Lymphadenopathy means "disease of the lymph glands." But the term is usually used to describe swollen or enlarged lymph glands, also called lymph nodes. These are the bean-shaped organs found in many locations including the neck, underarm, and groin. Lymph glands are part of the immune system, which fights infections in your body. Lymphadenopathy can occur in just one area of the body, such as the neck, or it can be  generalized, with lymph node enlargement in several areas. The nodes found in the neck are the most common sites of lymphadenopathy. CAUSES When your immune system responds to germs (such as viruses or bacteria ), infection-fighting cells and fluid build up. This causes the glands to grow in size. Usually, this is not something to worry about. Sometimes, the glands themselves can become infected and inflamed. This is called lymphadenitis. Enlarged lymph nodes can be caused by many diseases:  Bacterial disease, such as strep throat or a skin infection.  Viral disease, such as a common cold.  Other germs, such as Lyme disease, tuberculosis, or sexually transmitted diseases.  Cancers, such as lymphoma (cancer of the lymphatic system) or leukemia (cancer of the white blood cells).  Inflammatory diseases such as lupus or rheumatoid arthritis.  Reactions to medications. Many of the diseases above are rare, but important. This is why you should see your caregiver if you have lymphadenopathy. SYMPTOMS  Swollen, enlarged lumps in the neck, back of the head, or other locations.  Tenderness.  Warmth or redness of the skin over the lymph nodes.  Fever. DIAGNOSIS Enlarged lymph nodes are often near the source of infection. They can help health care providers diagnose your illness. For instance:  Swollen lymph nodes around the jaw might be caused by an infection in the mouth.  Enlarged glands in the neck often signal a throat infection.  Lymph nodes that are swollen in more than one area often indicate an illness caused by a virus. Your caregiver will likely know what is causing your lymphadenopathy after listening to your history and examining you. Blood tests, x-rays, or other tests may be needed.  If the cause of the enlarged lymph node cannot be found, and it does not go away by itself, then a biopsy may be needed. Your caregiver will discuss this with you. TREATMENT Treatment for your  enlarged lymph nodes will depend on the cause. Many times the nodes will shrink to normal size by themselves, with no treatment. Antibiotics or other medicines may be needed for infection. Only take over-the-counter or prescription medicines for pain, discomfort, or fever as directed by your caregiver. HOME CARE INSTRUCTIONS Swollen lymph glands usually return to normal when the underlying medical condition goes away. If they persist, contact your health-care provider. He/she might prescribe antibiotics or other treatments, depending on the diagnosis. Take any medications exactly as prescribed. Keep any follow-up appointments made to check on the condition of your enlarged nodes. SEEK MEDICAL CARE IF:  Swelling lasts for more than two weeks.  You have symptoms such as weight loss, night sweats, fatigue, or fever that does not go away.  The lymph nodes are hard, seem fixed to the skin, or are growing rapidly.  Skin over the lymph nodes is red and inflamed. This could mean there is an infection. SEEK IMMEDIATE MEDICAL CARE IF:  Fluid starts leaking from the area of the enlarged lymph node.  You develop a fever of 102 F (38.9 C) or greater.  Severe pain develops (not necessarily at the site of a large lymph node).  You develop chest pain or shortness of breath.  You develop worsening abdominal pain. MAKE SURE YOU:  Understand these instructions.  Will watch your condition.  Will get help right away if you are not doing well or get worse. Document Released: 07/19/2008 Document Revised: 02/24/2014 Document Reviewed: 07/19/2008 [facility] Patient Information 2015 [place], [place]. This information is not intended to replace advice given to you by your health care provider. Make sure you discuss any questions you have with your health care provider.

## 2014-06-30 NOTE — ED Notes (Signed)
Pt arrived to the ED with a complaint of upper neck pain and lower jaw pain.  Pt states he had lymphoids removed two years ago.  Pt states the pain began after he caught a cold on Saturday.  Urgent care prescribed medication and antibiotics but the pt has had no relief.

## 2014-07-01 ENCOUNTER — Emergency Department (HOSPITAL_COMMUNITY)
Admission: EM | Admit: 2014-07-01 | Discharge: 2014-07-01 | Disposition: A | Discharge status: Home / Self Care | Payer: BC Managed Care – PPO | Attending: Emergency Medicine | Admitting: Emergency Medicine

## 2014-07-01 ENCOUNTER — Encounter (HOSPITAL_COMMUNITY): Payer: Self-pay | Admitting: Emergency Medicine

## 2014-07-01 DIAGNOSIS — Z79899 Other long term (current) drug therapy: Secondary | ICD-10-CM | POA: Insufficient documentation

## 2014-07-01 DIAGNOSIS — Z8679 Personal history of other diseases of the circulatory system: Secondary | ICD-10-CM | POA: Insufficient documentation

## 2014-07-01 DIAGNOSIS — Z87891 Personal history of nicotine dependence: Secondary | ICD-10-CM | POA: Diagnosis not present

## 2014-07-01 DIAGNOSIS — Z860100 Personal history of colon polyps, unspecified: Secondary | ICD-10-CM | POA: Insufficient documentation

## 2014-07-01 DIAGNOSIS — R599 Enlarged lymph nodes, unspecified: Secondary | ICD-10-CM | POA: Diagnosis not present

## 2014-07-01 DIAGNOSIS — Z792 Long term (current) use of antibiotics: Secondary | ICD-10-CM | POA: Diagnosis not present

## 2014-07-01 DIAGNOSIS — Z8659 Personal history of other mental and behavioral disorders: Secondary | ICD-10-CM | POA: Diagnosis not present

## 2014-07-01 DIAGNOSIS — Z85819 Personal history of malignant neoplasm of unspecified site of lip, oral cavity, and pharynx: Secondary | ICD-10-CM | POA: Insufficient documentation

## 2014-07-01 DIAGNOSIS — M542 Cervicalgia: Secondary | ICD-10-CM | POA: Diagnosis present

## 2014-07-01 DIAGNOSIS — R52 Pain, unspecified: Secondary | ICD-10-CM

## 2014-07-01 MED ORDER — HYDROCODONE-ACETAMINOPHEN 7.5-325 MG/15ML PO SOLN
10.0000 mL | Freq: Once | ORAL | Status: AC
Start: 2014-07-01 — End: 2014-07-01
  Administered 2014-07-01: 10 mL via ORAL
  Filled 2014-07-01: qty 15

## 2014-07-01 MED ORDER — HYDROCODONE-ACETAMINOPHEN 7.5-325 MG/15ML PO SOLN
10.0000 mL | Freq: Four times a day (QID) | ORAL | Status: DC | PRN
Start: 2014-07-01 — End: 2015-07-20

## 2014-07-01 NOTE — ED Provider Notes (Signed)
Medical screening examination/treatment/procedure(s) were performed by non-physician practitioner and as supervising physician I was immediately available for consultation/collaboration.   EKG Interpretation None       Atlas [place] K Khristie Sak-Rasch, MD 07/01/14 [phone]

## 2014-07-01 NOTE — ED Notes (Signed)
Pt states he is having pain on the left side of his neck  Pt was seen here last night for swelling on the left side of his neck  Pt had a CT last night that showed cervical adenitis  Pt states last night he was not having pain but today he is   Pt has hx of cancer and had lymph nodes removed from the left side of his neck 2 years ago

## 2014-07-01 NOTE — ED Provider Notes (Signed)
CSN: [id number]     Arrival date & time 07/01/14  0446 History   First MD Initiated Contact with Patient 07/01/14 [phone]     Chief Complaint  Patient presents with  . Neck Pain     (Consider location/radiation/quality/duration/timing/severity/associated sxs/prior Treatment) HPI Comments: Patient with a history of throat cancer was assessed and evaluated last night, was diagnosed with lymphadenopathy, and started on antibiotics.  Presents tonight with increased pain, and difficulty swallowing.  He does have a history of dry mouth syndrome since his radiation and he normally just drinks water to keep his mouth and palate moist.  Has been experiencing increased difficulty since waking about 2 AM denies any new symptoms.  Denies fever.  He, states his next scheduled appointment with his specialist at [facility] is not until November.  He has pet scans every 3 months, which have not showed any metastasis  Patient is a 53 y.o. male presenting with neck pain. The history is provided by the patient.  Neck Pain Pain location:  L side Quality:  Aching Pain radiates to:  Does not radiate Pain severity:  Moderate Onset quality:  Gradual Duration:  1 day Timing:  Constant Progression:  Worsening Chronicity:  New Associated symptoms: no fever and no headaches     Past Medical History  Diagnosis Date  . Internal hemorrhoids without mention of complication   . Metastatic malignant melanoma   . Colon polyp     adenomatous  . Anxiety and depression   . Esophagitis     severe-due to [facility] treatment  . Colitis     secondary to [facility] treatment  . Melanoma     melanoma dx 2007;  . Bursitis     left shoulder  . History of throat cancer   . Colitis    Past Surgical History  Procedure Laterality Date  . Melanoma excision  2008/2015    Lymph node removal/neck and groin  . Shoulder surgery  2010    left shoulder  . Polypectomy    . Radiation treatment      7 weeks in 2013  . Axillary lymph node  biopsy Right 06/17/14    hx. melanoma 2 spots bx.   Family History  Problem Relation Age of Onset  . Diabetes      Grandmother  . Coronary artery disease      Grandfather  . Heart disease Maternal Grandmother   . Heart disease Maternal Grandfather    History  Substance Use Topics  . Smoking status: Former Smoker    Types: Cigarettes    Quit date: 06/07/1979  . Smokeless tobacco: Never Used  . Alcohol Use: No    Review of Systems  Constitutional: Negative for fever and chills.  HENT: Positive for trouble swallowing and voice change.   Musculoskeletal: Positive for neck pain.  Skin: Negative for wound.  Neurological: Negative for dizziness and headaches.  All other systems reviewed and are negative.     Allergies  Codeine and Oxycodone  Home Medications   Prior to Admission medications   Medication Sig Start Date End Date Taking? Authorizing Provider  amoxicillin-clavulanate (AUGMENTIN) 875-125 MG per tablet Take 1 tablet by mouth 2 (two) times daily. 10 day therapy course patient began on 06/28/2014 06/28/14  Yes Historical Provider, MD  Multiple Vitamin (MULTIVITAMIN) tablet Take 1 tablet by mouth daily.   Yes Historical Provider, MD  HYDROcodone-acetaminophen (HYCET) 7.5-325 mg/15 ml solution Take 10 mLs by mouth every 6 (six) hours as needed for  moderate pain. 07/01/14   Garald Balding, NP   BP 136/83  Pulse 77  Temp(Src) 98 F (36.7 C) (Oral)  Resp 16  SpO2 99% Physical Exam  Nursing note and vitals reviewed. Constitutional: He appears well-developed and well-nourished.  HENT:  Head: Normocephalic.  Mouth/Throat: No oropharyngeal exudate, posterior oropharyngeal edema or posterior oropharyngeal erythema.  Neck: Normal range of motion.    Lymphadenopathy:    He has cervical adenopathy.    ED Course  Procedures (including critical care time) Labs Review Labs Reviewed - No data to display  Imaging Review Ct Soft Tissue Neck W Contrast  06/30/2014   CLINICAL  DATA:  Swelling and tenderness of the left neck near the mandible. History of squamous cell carcinoma of the head and neck and prior left neck dissection. Additional prior history of melanoma.  EXAM: CT NECK WITH CONTRAST  TECHNIQUE: Multidetector CT imaging of the neck was performed using the standard protocol following the bolus administration of intravenous contrast.  CONTRAST:  162mL OMNIPAQUE IOHEXOL 300 MG/ML  SOLN  COMPARISON:  12/06/2010 and PET scan on 04/11/2011.  FINDINGS: No evidence of focal mass or enlarged lymph nodes. Some small cervical nodes and submandibular nodes are identified. Postsurgical changes are seen after left neck dissection. No evidence of inflammatory process or abscess. No vascular abnormalities are identified. The visualized airway is normally patent. No mucosal mass lesions are identified in the pharynx or larynx. Visualized paranasal sinuses and mastoid air cells are normally aerated. Salivary glands are symmetric and show no evidence of enlargement or inflammation.  IMPRESSION: No evidence of neck masses or enlarged lymph nodes. No inflammatory process identified.   Electronically Signed   By: Aletta Edouard M.D.   On: 06/30/2014 08:27     EKG Interpretation None      MDM   Final diagnoses:  Pain     Will dc home pain medication continue antibiotic and FU at Mahlon Gammon, NP 07/01/14 [phone]

## 2015-07-20 ENCOUNTER — Encounter (HOSPITAL_COMMUNITY): Payer: Self-pay | Admitting: Emergency Medicine

## 2015-07-20 ENCOUNTER — Emergency Department (HOSPITAL_COMMUNITY): Payer: No Typology Code available for payment source

## 2015-07-20 ENCOUNTER — Emergency Department (HOSPITAL_COMMUNITY)
Admission: EM | Admit: 2015-07-20 | Discharge: 2015-07-20 | Disposition: A | Discharge status: Home / Self Care | Payer: No Typology Code available for payment source | Attending: Emergency Medicine | Admitting: Emergency Medicine

## 2015-07-20 DIAGNOSIS — Z79899 Other long term (current) drug therapy: Secondary | ICD-10-CM | POA: Insufficient documentation

## 2015-07-20 DIAGNOSIS — Z87891 Personal history of nicotine dependence: Secondary | ICD-10-CM | POA: Insufficient documentation

## 2015-07-20 DIAGNOSIS — Z8719 Personal history of other diseases of the digestive system: Secondary | ICD-10-CM | POA: Insufficient documentation

## 2015-07-20 DIAGNOSIS — N508 Other specified disorders of male genital organs: Secondary | ICD-10-CM | POA: Diagnosis not present

## 2015-07-20 DIAGNOSIS — Z85818 Personal history of malignant neoplasm of other sites of lip, oral cavity, and pharynx: Secondary | ICD-10-CM | POA: Diagnosis not present

## 2015-07-20 DIAGNOSIS — R1030 Lower abdominal pain, unspecified: Secondary | ICD-10-CM | POA: Diagnosis present

## 2015-07-20 DIAGNOSIS — Z85820 Personal history of malignant melanoma of skin: Secondary | ICD-10-CM | POA: Diagnosis not present

## 2015-07-20 DIAGNOSIS — M25551 Pain in right hip: Secondary | ICD-10-CM | POA: Diagnosis not present

## 2015-07-20 DIAGNOSIS — F418 Other specified anxiety disorders: Secondary | ICD-10-CM | POA: Insufficient documentation

## 2015-07-20 DIAGNOSIS — R1031 Right lower quadrant pain: Secondary | ICD-10-CM

## 2015-07-20 DIAGNOSIS — Z86010 Personal history of colonic polyps: Secondary | ICD-10-CM | POA: Diagnosis not present

## 2015-07-20 DIAGNOSIS — N50819 Testicular pain, unspecified: Secondary | ICD-10-CM

## 2015-07-20 LAB — URINALYSIS, ROUTINE W REFLEX MICROSCOPIC
Bilirubin Urine: NEGATIVE
Glucose, UA: NEGATIVE mg/dL
Hgb urine dipstick: NEGATIVE
Ketones, ur: NEGATIVE mg/dL
Leukocytes, UA: NEGATIVE
Nitrite: NEGATIVE
Protein, ur: NEGATIVE mg/dL
Specific Gravity, Urine: 1.023 (ref 1.005–1.030)
Urobilinogen, UA: 0.2 mg/dL (ref 0.0–1.0)
pH: 5.5 (ref 5.0–8.0)

## 2015-07-20 LAB — I-STAT CHEM 8, ED
BUN: 13 mg/dL (ref 6–20)
Calcium, Ion: 1.15 mmol/L (ref 1.12–1.23)
Chloride: 104 mmol/L (ref 101–111)
Creatinine, Ser: 0.8 mg/dL (ref 0.61–1.24)
Glucose, Bld: 96 mg/dL (ref 65–99)
HCT: 47 % (ref 39.0–52.0)
Hemoglobin: 16 g/dL (ref 13.0–17.0)
Potassium: 3.8 mmol/L (ref 3.5–5.1)
Sodium: 141 mmol/L (ref 135–145)
TCO2: 24 mmol/L (ref 0–100)

## 2015-07-20 MED ORDER — ORPHENADRINE CITRATE 30 MG/ML IJ SOLN
60.0000 mg | Freq: Two times a day (BID) | INTRAMUSCULAR | Status: DC
  Filled 2015-07-20 (×2): qty 2

## 2015-07-20 MED ORDER — HYDROCODONE-IBUPROFEN 7.5-200 MG PO TABS: 1.0000 | ORAL_TABLET | Freq: Four times a day (QID) | ORAL | Status: DC | PRN

## 2015-07-20 MED ORDER — HYDROCODONE-ACETAMINOPHEN 7.5-325 MG/15ML PO SOLN
10.0000 mL | Freq: Once | ORAL | Status: DC
  Filled 2015-07-20: qty 15

## 2015-07-20 MED ORDER — DIAZEPAM 5 MG/ML IJ SOLN
5.0000 mg | Freq: Once | INTRAMUSCULAR | Status: AC
  Administered 2015-07-20: 5 mg via INTRAMUSCULAR
  Filled 2015-07-20: qty 2

## 2015-07-20 MED ORDER — DIAZEPAM 5 MG PO TABS: 5.0000 mg | ORAL_TABLET | Freq: Four times a day (QID) | ORAL | Status: DC | PRN

## 2015-07-20 MED ORDER — MORPHINE SULFATE (PF) 4 MG/ML IV SOLN
6.0000 mg | Freq: Once | INTRAVENOUS | Status: AC
  Administered 2015-07-20: 6 mg via INTRAMUSCULAR
  Filled 2015-07-20: qty 2

## 2015-07-20 MED ORDER — ONDANSETRON 8 MG PO TBDP: ORAL_TABLET | ORAL | Status: DC

## 2015-07-20 MED ORDER — METOCLOPRAMIDE HCL 5 MG/ML IJ SOLN
10.0000 mg | Freq: Once | INTRAMUSCULAR | Status: AC
  Administered 2015-07-20: 10 mg via INTRAMUSCULAR
  Filled 2015-07-20: qty 2

## 2015-07-20 NOTE — ED Notes (Signed)
Pt calling wife to pick him up as he drove himself.

## 2015-07-20 NOTE — ED Provider Notes (Signed)
CSN: [id number]     Arrival date & time 07/20/15  1112 History   First MD Initiated Contact with Patient 07/20/15 1556     Chief Complaint  Patient presents with  . Testicle Pain    6 day hx of r/groin/hip pain  . Groin Pain  . Hip Pain     (Consider location/radiation/quality/duration/timing/severity/associated sxs/prior Treatment) Patient is a 54 y.o. male presenting with abdominal pain.  Abdominal Pain Pain location:  RLQ Pain quality: aching   Pain radiates to:  Does not radiate Pain severity:  Mild Onset quality:  Gradual Duration:  3 days Timing:  Constant Chronicity:  New Context: not alcohol use, not eating and not previous surgeries   Relieved by:  None tried Worsened by:  Nothing tried Ineffective treatments:  None tried Associated symptoms: no constipation, no diarrhea, no hematuria and no vomiting     Past Medical History  Diagnosis Date  . Internal hemorrhoids without mention of complication   . Metastatic malignant melanoma   . Colon polyp     adenomatous  . Anxiety and depression   . Esophagitis     severe-due to [facility] treatment  . Colitis     secondary to [facility] treatment  . Melanoma     melanoma dx 2007;  . Bursitis     left shoulder  . History of throat cancer   . Colitis    Past Surgical History  Procedure Laterality Date  . Melanoma excision  2008/2015    Lymph node removal/neck and groin  . Shoulder surgery  2010    left shoulder  . Polypectomy    . Radiation treatment      7 weeks in 2013  . Axillary lymph node biopsy Right 06/17/14    hx. melanoma 2 spots bx.   Family History  Problem Relation Age of Onset  . Diabetes      Grandmother  . Coronary artery disease      Grandfather  . Heart disease Maternal Grandmother   . Heart disease Maternal Grandfather    Social History  Substance Use Topics  . Smoking status: Former Smoker    Types: Cigarettes    Quit date: 06/07/1979  . Smokeless tobacco: Never Used  . Alcohol Use:  No    Review of Systems  Gastrointestinal: Positive for abdominal pain. Negative for vomiting, diarrhea and constipation.  Genitourinary: Positive for testicular pain. Negative for hematuria, discharge, penile swelling, scrotal swelling and penile pain.  All other systems reviewed and are negative.     Allergies  Codeine and Oxycodone  Home Medications   Prior to Admission medications   Medication Sig Start Date End Date Taking? Authorizing Provider  Multiple Vitamin (MULTIVITAMIN) tablet Take 1 tablet by mouth daily.   Yes Historical Provider, MD  diazepam (VALIUM) 5 MG tablet Take 1 tablet (5 mg total) by mouth every 6 (six) hours as needed (spasms). 07/20/15   Merrily Pew, MD  HYDROcodone-ibuprofen (VICOPROFEN) 7.5-200 MG per tablet Take 1 tablet by mouth every 6 (six) hours as needed for moderate pain. 07/20/15   Merrily Pew, MD  ondansetron (ZOFRAN ODT) 8 MG disintegrating tablet 8mg  ODT q4 hours prn nausea 07/20/15   Merrily Pew, MD   BP 140/94 mmHg  Pulse 58  Temp(Src) 97.5 F (36.4 C) (Oral)  Resp 15  Wt 215 lb (97.523 kg)  SpO2 94% Physical Exam  Constitutional: He is oriented to person, place, and time. He appears well-developed and well-nourished.  HENT:  Head:  Normocephalic and atraumatic.  Eyes: Conjunctivae and EOM are normal.  Neck: Normal range of motion. Neck supple.  Cardiovascular: Normal rate and regular rhythm.   Pulmonary/Chest: Effort normal. No respiratory distress.  Abdominal: Soft. There is tenderness (RLQ).  Genitourinary: Penis normal. Right testis shows no mass, no swelling and no tenderness. Left testis shows no mass, no swelling and no tenderness. No penile tenderness.  Musculoskeletal: Normal range of motion. He exhibits no edema or tenderness.  Neurological: He is alert and oriented to person, place, and time.  Skin: Skin is warm and dry.  Nursing note and vitals reviewed.   ED Course  Procedures (including critical care time) Labs  Review Labs Reviewed  URINALYSIS, ROUTINE W REFLEX MICROSCOPIC (NOT AT [facility]) - Abnormal; Notable for the following:    APPearance CLOUDY (*)    All other components within normal limits  I-STAT CHEM 8, ED    Imaging Review US Scrotum  07/20/2015   CLINICAL DATA:  Right testicular pain for 6 days  EXAM: SCROTAL ULTRASOUND  DOPPLER ULTRASOUND OF THE TESTICLES  TECHNIQUE: Complete ultrasound examination of the testicles, epididymis, and other scrotal structures was performed. Color and spectral Doppler ultrasound were also utilized to evaluate blood flow to the testicles.  COMPARISON:  None.  FINDINGS: Right testicle  Measurements: 4.8 x 2.4 x 3.2 cm. No mass or microlithiasis visualized.  Left testicle  Measurements: 4.8 x 2.5 x 2.7 cm. No mass or microlithiasis visualized.  Right epididymis:  Normal in size and appearance.  Left epididymis:  There is a 0.4 x 0.6 x 0.9 cm complicated cyst.  Hydrocele:  None visualized.  Varicocele:  None visualized.  Pulsed Doppler interrogation of both testes demonstrates normal low resistance arterial and venous waveforms bilaterally.  IMPRESSION: No evidence of testicular torsion.  No acute abnormality.   Electronically Signed   By: Abelardo Diesel M.D.   On: 07/20/2015 19:07   [place] Art/ven Flow Abd Pelv Doppler  07/20/2015   CLINICAL DATA:  Right testicular pain for 6 days  EXAM: SCROTAL ULTRASOUND  DOPPLER ULTRASOUND OF THE TESTICLES  TECHNIQUE: Complete ultrasound examination of the testicles, epididymis, and other scrotal structures was performed. Color and spectral Doppler ultrasound were also utilized to evaluate blood flow to the testicles.  COMPARISON:  None.  FINDINGS: Right testicle  Measurements: 4.8 x 2.4 x 3.2 cm. No mass or microlithiasis visualized.  Left testicle  Measurements: 4.8 x 2.5 x 2.7 cm. No mass or microlithiasis visualized.  Right epididymis:  Normal in size and appearance.  Left epididymis:  There is a 0.4 x 0.6 x 0.9 cm complicated cyst.   Hydrocele:  None visualized.  Varicocele:  None visualized.  Pulsed Doppler interrogation of both testes demonstrates normal low resistance arterial and venous waveforms bilaterally.  IMPRESSION: No evidence of testicular torsion.  No acute abnormality.   Electronically Signed   By: Abelardo Diesel M.D.   On: 07/20/2015 19:07   Ct Renal Stone Study  07/20/2015   CLINICAL DATA:  Right testicular pain for the past 6 days, radiating to the right hip. History of nephrolithiasis.  EXAM: CT ABDOMEN AND PELVIS WITHOUT CONTRAST  TECHNIQUE: Multidetector CT imaging of the abdomen and pelvis was performed following the standard protocol without IV contrast.  COMPARISON:  Previous examinations, the most recent dated 01/23/2013.  FINDINGS: The left renal collecting system remains mildly dilated. The left ureter is normal in caliber. The previously seen left UVJ calculus is no longer demonstrated. Currently, no urinary  tract calculi are seen. An oval, medium density mass arising from the medial aspect of the lower pole of the right kidney measures proximally 1.9 x 1.0 cm on image number 38 of series 2. This represents no significant change since 12/06/2010. This measures 33 Hounsfield units in density today, 43 Hounsfield units in density on 01/23/2013 and 25 Hounsfield units in density on the postcontrast images dated 12/06/2010.  Unremarkable non contrasted appearance of the liver, spleen, pancreas, gallbladder, adrenal glands and prostate gland. Prominent stool in the colon, especially in the rectum. The appendix is unchanged with no interval changes of acute appendicitis. No gastric or small bowel abnormalities are visualized. No enlarged lymph nodes.  The hips have normal appearances. Mild lumbar and lower thoracic spine degenerative changes. The previously seen 6 mm nodule at the right lung base, adjacent to the right hemidiaphragm, measures 6 mm on image number 10 today. Small amount of scarring at the location of the  previously seen 8 mm subpleural nodular density in the left lower lobe.  IMPRESSION: 1. No acute abnormality. 2. Stable mild left hydronephrosis without ureteral dilatation or obstructing calculus, compatible with mild partial left UPJ obstruction. 3. Stable 1.8 cm probable complex cyst arising from the medial aspect of the lower pole of the right kidney. The long-term stability is compatible with a benign process. 4. Stable 6 mm right lower lobe nodule. The long-term stability is compatible with a benign process.   Electronically Signed   By: Claudie Revering M.D.   On: 07/20/2015 16:40   I have personally reviewed and evaluated these images and lab results as part of my medical decision-making.   EKG Interpretation None      MDM   Final diagnoses:  Testicle pain  RLQ abdominal pain   RLQ pain radiating to testicles with subsequent pain there as well. Exam benign as documented above. Possible appy v stone v torsion, will eval appropriately.   Labs and imaging negative. Treated with Valium which improved his symptoms. We'll DC on same. Will follow-up with his primary doctor for further evaluation especially if symptoms do not improve.  I have personally and contemperaneously reviewed labs and imaging and used in my decision making as above.   A medical screening exam was performed and I feel the patient has had an appropriate workup for their chief complaint at this time and likelihood of emergent condition existing is low. They have been counseled on decision, discharge, follow up and which symptoms necessitate immediate return to the emergency department. They or their family verbally stated understanding and agreement with plan and discharged in stable condition.      Merrily Pew, MD 07/21/15 [phone]

## 2015-07-20 NOTE — ED Notes (Signed)
Patient transported to CT 

## 2015-07-20 NOTE — ED Notes (Signed)
Unable to get any blood from pt.

## 2015-07-20 NOTE — ED Notes (Signed)
MD at bedside. 

## 2015-07-20 NOTE — ED Notes (Signed)
Pt given sandwich 

## 2015-07-20 NOTE — ED Notes (Signed)
Pt reports 6 day hx of pain in r/testicle , radiating to r/hip. Seen by Dr Alfonso Ramus this am, stated that there was unable to identify any orthopedic reason for the pain. Pt was seen at [facility] for complete CT scan of body one week ago. This was a 6 month check up.  Results:'spot on r/kidney". Last treated for CA 3 years ago. Pt is alert, oriented and ambulatory

## 2018-01-31 ENCOUNTER — Encounter: Payer: Self-pay | Admitting: *Deleted

## 2018-02-01 ENCOUNTER — Encounter: Payer: Self-pay | Admitting: Neurology

## 2018-02-01 ENCOUNTER — Ambulatory Visit (INDEPENDENT_AMBULATORY_CARE_PROVIDER_SITE_OTHER): Payer: BLUE CROSS/BLUE SHIELD | Admitting: Neurology

## 2018-02-01 ENCOUNTER — Other Ambulatory Visit: Payer: Self-pay

## 2018-02-01 VITALS — BP 135/68 | HR 80 | Ht 73.0 in | Wt 219.5 lb

## 2018-02-01 DIAGNOSIS — M79672 Pain in left foot: Secondary | ICD-10-CM

## 2018-02-01 DIAGNOSIS — M79671 Pain in right foot: Secondary | ICD-10-CM | POA: Diagnosis not present

## 2018-02-01 NOTE — Progress Notes (Signed)
Reason for visit: Bilateral foot pain  Referring physician: Dr. Marye Round Nathaniel Turner is a 57 y.o. male  History of present illness:  Nathaniel Turner is a 57 year old left-handed white male with a history of bilateral foot discomfort that dates back 6-8 months.  The patient indicates that when he first wakes up in the morning he has no pain in his feet whatsoever.  When he starts to walk during the day, the pain gradually worsens and he feels significant discomfort by the evening.  The patient reports that the pain is mainly in the ball of the feet with associated numbness in that area.  The pain may be shooting and sharp in nature with some dull achy pain as well.  The patient denies any low back pain or pain down the legs on either side.  He reports no weakness of the arms or legs.  He denies any neck pain.  The patient denies issues controlling the bowels or bladder, he has not had any balance issues.  The patient takes gabapentin which seems to offer some benefit with the discomfort.  The patient does have a history of diet-controlled diabetes.  He is sent to this office for further evaluation.  Past Medical History:  Diagnosis Date  . Anxiety and depression   . Bursitis    left shoulder  . Colitis    secondary to [facility] treatment  . Colitis   . Colon polyp    adenomatous  . Esophagitis    severe-due to [facility] treatment  . History of throat cancer   . Internal hemorrhoids without mention of complication   . Melanoma ([place])    melanoma dx 2007;  . Metastatic malignant melanoma [facility])     Past Surgical History:  Procedure Laterality Date  . AXILLARY LYMPH NODE BIOPSY Right 06/17/14   hx. melanoma 2 spots bx.  Marland Kitchen MELANOMA EXCISION  2008/2015   Lymph node removal/neck and groin  . POLYPECTOMY    . radiation treatment     7 weeks in 2013  . SHOULDER SURGERY  2010   left shoulder    Family History  Problem Relation Age of Onset  . Heart disease Maternal Grandmother   . Heart  disease Maternal Grandfather   . Diabetes Unknown        Grandmother  . Coronary artery disease Unknown        Grandfather    Social history:  reports that he quit smoking about 38 years ago. His smoking use included cigarettes. He has never used smokeless tobacco. He reports that he does not drink alcohol or use drugs.  Medications:  Prior to Admission medications   Medication Sig Start Date End Date Taking? Authorizing Provider  gabapentin (NEURONTIN) 300 MG capsule Take 300 mg by mouth 2 (two) times daily. 01/08/18  Yes [provider]  Multiple Vitamin (MULTIVITAMIN) tablet Take 1 tablet by mouth daily.   Yes [provider]  rosuvastatin (CRESTOR) 5 MG tablet Take 5 mg by mouth daily. 01/08/18  Yes [provider]      Allergies  Allergen Reactions  . Codeine Itching  . Oxycodone Rash    ROS:  Out of a complete 14 system review of symptoms, the patient complains only of the following symptoms, and all other reviewed systems are negative.  Numbness  Blood pressure 135/68, pulse 80, height 6\' 1"  (1.854 m), weight 219 lb 8 oz (99.6 kg).  Physical Exam  General: The patient is alert and  cooperative at the time of the examination.  Eyes: Pupils are equal, round, and reactive to light. Discs are flat bilaterally.  Neck: The neck is supple, no carotid bruits are noted.  Respiratory: The respiratory examination is clear.  Cardiovascular: The cardiovascular examination reveals a regular rate and rhythm, no obvious murmurs or rubs are noted.  Skin: Extremities are without significant edema.  Neurologic Exam  Mental status: The patient is alert and oriented x 3 at the time of the examination. The patient has apparent normal recent and remote memory, with an apparently normal attention span and concentration ability.  Cranial nerves: Facial symmetry is present. There is good sensation of the face to pinprick and soft touch bilaterally. The strength  of the facial muscles and the muscles to head turning and shoulder shrug are normal bilaterally. Speech is well enunciated, no aphasia or dysarthria is noted. Extraocular movements are full. Visual fields are full. The tongue is midline, and the patient has symmetric elevation of the soft palate. No obvious hearing deficits are noted.  Motor: The motor testing reveals 5 over 5 strength of all 4 extremities. Good symmetric motor tone is noted throughout.  Sensory: Sensory testing is intact to pinprick, soft touch, vibration sensation, and position sense on all 4 extremities, with exception that there is a stocking pattern pinprick sensory deficit in the distal third of the legs bilaterally. No evidence of extinction is noted.  Coordination: Cerebellar testing reveals good finger-nose-finger and heel-to-shin bilaterally.  Gait and station: Gait is normal. Tandem gait is normal. Romberg is negative. No drift is seen.  Reflexes: Deep tendon reflexes are symmetric and normal bilaterally. Toes are downgoing bilaterally.   Assessment/Plan:  1.  Bilateral foot discomfort  The patient reports some numbness and discomfort in the feet on both sides, clinical examination does show a stocking pattern pinprick sensory deficit and he may have a mild neuropathy.  The patient however reports that his foot pain is worse when he is up on his feet suggesting there may be a mechanical source pain from arthritis or tendinopathy.  The patient will be set up for nerve conduction studies on both legs and EMG on one leg.  If a peripheral neuropathy is present, further blood work will be done.  He will follow-up for the above evaluation.  Jill Alexanders MD 02/01/2018 3:22 PM  [facility] [address] [place], [place] [address]  Phone [phone] Fax [phone]

## 2018-02-01 NOTE — Patient Instructions (Signed)
   We will check EMG and NCV study to look at the nerve function of the feet and legs.

## 2018-03-07 ENCOUNTER — Ambulatory Visit (INDEPENDENT_AMBULATORY_CARE_PROVIDER_SITE_OTHER): Payer: BLUE CROSS/BLUE SHIELD | Admitting: Neurology

## 2018-03-07 ENCOUNTER — Other Ambulatory Visit: Payer: Self-pay | Admitting: Neurology

## 2018-03-07 ENCOUNTER — Encounter: Payer: BLUE CROSS/BLUE SHIELD | Admitting: Neurology

## 2018-03-07 ENCOUNTER — Encounter: Payer: Self-pay | Admitting: Neurology

## 2018-03-07 DIAGNOSIS — E1142 Type 2 diabetes mellitus with diabetic polyneuropathy: Secondary | ICD-10-CM

## 2018-03-07 DIAGNOSIS — G603 Idiopathic progressive neuropathy: Secondary | ICD-10-CM

## 2018-03-07 DIAGNOSIS — M79672 Pain in left foot: Secondary | ICD-10-CM

## 2018-03-07 DIAGNOSIS — M79671 Pain in right foot: Secondary | ICD-10-CM

## 2018-03-07 HISTORY — DX: Type 2 diabetes mellitus with diabetic polyneuropathy: E11.42

## 2018-03-07 NOTE — Progress Notes (Signed)
Please refer to EMG and nerve conduction study procedure note. 

## 2018-03-07 NOTE — Procedures (Signed)
     HISTORY:   Nathaniel Turner is a 57 year old gentleman with a history of diet-controlled diabetes who reports discomfort in both feet and with some paresthesias.  The patient is being evaluated for a possible peripheral neuropathy.  NERVE CONDUCTION STUDIES:  Nerve conduction studies were performed on both lower extremities.  The distal motor latencies for the peroneal and posterior tibial nerves were normal bilaterally with low motor amplitudes seen for the right peroneal nerve and borderline normal for the left peroneal nerve.  The motor amplitudes for the posterior tibial nerves were normal bilaterally.  Slowing was seen for the peroneal and posterior tibial nerves bilaterally.  The sural sensory latencies were normal bilaterally but the peroneal sensory latencies were prolonged bilaterally.  The F-wave latencies for the posterior tibial nerves were prolonged bilaterally.  EMG STUDIES:  EMG study was performed on the right lower extremity:  The tibialis anterior muscle reveals 2 to 4K motor units with full recruitment. No fibrillations or positive waves were seen. The peroneus tertius muscle reveals 2 to 4K motor units with decreased recruitment. No fibrillations or positive waves were seen. The medial gastrocnemius muscle reveals 1 to 3K motor units with full recruitment. No fibrillations or positive waves were seen. The vastus lateralis muscle reveals 2 to 4K motor units with full recruitment. No fibrillations or positive waves were seen. The iliopsoas muscle reveals 2 to 4K motor units with full recruitment. No fibrillations or positive waves were seen. The biceps femoris muscle (long head) reveals 2 to 4K motor units with full recruitment. No fibrillations or positive waves were seen. The lumbosacral paraspinal muscles were tested at 3 levels, and revealed no abnormalities of insertional activity at all 3 levels tested. There was good relaxation.   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  EMG evaluation of the right lower extremity shows minimal distal chronic stable signs of denervation consistent with a diagnosis of peripheral neuropathy.  There is no evidence of an overlying lumbosacral radiculopathy.  Jill Alexanders MD 03/07/2018 4:22 PM  [facility] [address] [place], [place] [address]  Phone [phone] Fax [phone]

## 2018-03-07 NOTE — Progress Notes (Addendum)
The patient comes in today for EMG and nerve conduction study evaluation.  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  The patient will be sent for blood work today looking for other etiologies of peripheral neuropathy.  He is on gabapentin taking 300 mg twice daily without complete control of the pain.  He will go up to 300 mg in the morning and 600 mg in the evening of gabapentin, he will call for any dose adjustments.     [place]    Nerve / Sites Muscle Latency Ref. Amplitude Ref. Rel Amp Segments Distance Velocity Ref. Area    ms ms mV mV %  cm m/s m/s mVms  R Peroneal - EDB     Ankle EDB 5.9 ?6.5 1.3 ?2.0 100 Ankle - EDB 9   3.7     Fib head EDB 15.1  2.9  229 Fib head - Ankle 37 41 ?44 12.2     Pop fossa EDB 17.5  2.8  95.2 Pop fossa - Fib head 10 41 ?44 11.6     Acc Peron EDB 6.0  1.9  66.6 Pop fossa - Ankle    6.0         Acc Peron - Pop fossa      L Peroneal - EDB     Ankle EDB 5.2 ?6.5 2.0 ?2.0 100 Ankle - EDB 9   6.7     Fib head EDB 15.1  1.5  78 Fib head - Ankle 37 38 ?44 5.6     Pop fossa EDB 17.6  1.5  97.3 Pop fossa - Fib head 10 39 ?44 5.7     Acc Peron EDB      Pop fossa - Ankle             Acc Peron - Pop fossa      R Tibial - AH     Ankle AH 4.4 ?5.8 9.1 ?4.0 100 Ankle - AH 9   29.9     Pop fossa AH 16.0  6.2  67.9 Pop fossa - Ankle 40 34 ?41 24.8  L Tibial - AH     Ankle AH 4.5 ?5.8 13.0 ?4.0 100 Ankle - AH 9   36.5     Pop fossa AH 15.9  9.1  70.1 Pop fossa - Ankle 40 35 ?41 31.9             SNC    Nerve / Sites Rec. Site Peak Lat Ref.  Amp Ref. Segments Distance    ms ms V V  cm  R Sural - Ankle (Calf)     Calf Ankle 4.1 ?4.4 2 ?6 Calf - Ankle 14  L Sural - Ankle (Calf)     Calf Ankle 4.0 ?4.4 1 ?6 Calf - Ankle 14  R Superficial peroneal - Ankle     Lat leg Ankle 4.7 ?4.4 3 ?6 Lat leg - Ankle 14  L Superficial peroneal - Ankle     Lat leg Ankle 5.4 ?4.4 2 ?6 Lat leg - Ankle 14              F  Wave    Nerve F Lat Ref.   ms ms  R Tibial - AH 59.9 ?56.0  L Tibial - AH 62.2 ?56.0

## 2018-03-07 NOTE — Addendum Note (Signed)
Addended by: Inis Sizer D on: 03/07/2018 04:46 PM   Modules accepted: Orders

## 2018-03-13 ENCOUNTER — Telehealth: Payer: Self-pay | Admitting: *Deleted

## 2018-03-13 LAB — MULTIPLE MYELOMA PANEL, SERUM
Albumin SerPl Elph-Mcnc: 4 g/dL (ref 2.9–4.4)
Albumin/Glob SerPl: 1.5 (ref 0.7–1.7)
Alpha 1: 0.2 g/dL (ref 0.0–0.4)
Alpha2 Glob SerPl Elph-Mcnc: 0.7 g/dL (ref 0.4–1.0)
B-Globulin SerPl Elph-Mcnc: 1.1 g/dL (ref 0.7–1.3)
Gamma Glob SerPl Elph-Mcnc: 0.8 g/dL (ref 0.4–1.8)
Globulin, Total: 2.8 g/dL (ref 2.2–3.9)
IgA/Immunoglobulin A, Serum: 92 mg/dL (ref 90–386)
IgG (Immunoglobin G), Serum: 919 mg/dL (ref 700–1600)
IgM (Immunoglobulin M), Srm: 43 mg/dL (ref 20–172)
Total Protein: 6.8 g/dL (ref 6.0–8.5)

## 2018-03-13 LAB — SEDIMENTATION RATE: Sed Rate: 2 mm/hr (ref 0–30)

## 2018-03-13 LAB — ANA W/REFLEX: Anti Nuclear Antibody(ANA): NEGATIVE

## 2018-03-13 LAB — VITAMIN B12: Vitamin B-12: 824 pg/mL (ref 232–1245)

## 2018-03-13 LAB — B. BURGDORFI ANTIBODIES: Lyme IgG/IgM Ab: <0.91 {ISR} (ref 0.00–0.90)

## 2018-03-13 LAB — ANGIOTENSIN CONVERTING ENZYME: Angio Convert Enzyme: 76 U/L (ref 14–82)

## 2018-03-13 NOTE — Telephone Encounter (Signed)
Called, LVM for pt about unremarkable labs per CW,MD note. Gave [facility] phone number if he has further questions/concerns. Advised he did not need to return my call.

## 2018-03-13 NOTE — Telephone Encounter (Signed)
-----   Message from Kathrynn Ducking, MD sent at 03/13/2018  7:14 AM EDT -----   The blood work results are unremarkable. Please call the patient.  ----- Message ----- From: Lavone Neri Lab Results In Sent: 03/08/2018   7:40 AM To: Kathrynn Ducking, MD

## 2018-09-06 ENCOUNTER — Encounter: Payer: Self-pay | Admitting: Gastroenterology

## 2018-09-06 ENCOUNTER — Other Ambulatory Visit (INDEPENDENT_AMBULATORY_CARE_PROVIDER_SITE_OTHER): Payer: BLUE CROSS/BLUE SHIELD

## 2018-09-06 ENCOUNTER — Ambulatory Visit (INDEPENDENT_AMBULATORY_CARE_PROVIDER_SITE_OTHER): Payer: BLUE CROSS/BLUE SHIELD | Admitting: Gastroenterology

## 2018-09-06 VITALS — BP 152/72 | HR 97 | Ht 73.0 in | Wt 208.0 lb

## 2018-09-06 DIAGNOSIS — K649 Unspecified hemorrhoids: Secondary | ICD-10-CM | POA: Diagnosis not present

## 2018-09-06 DIAGNOSIS — R197 Diarrhea, unspecified: Secondary | ICD-10-CM

## 2018-09-06 DIAGNOSIS — K625 Hemorrhage of anus and rectum: Secondary | ICD-10-CM

## 2018-09-06 DIAGNOSIS — K6289 Other specified diseases of anus and rectum: Secondary | ICD-10-CM | POA: Diagnosis not present

## 2018-09-06 LAB — CBC
HCT: 46.8 % (ref 39.0–52.0)
Hemoglobin: 16.2 g/dL (ref 13.0–17.0)
MCHC: 34.7 g/dL (ref 30.0–36.0)
MCV: 87.6 fl (ref 78.0–100.0)
Platelets: 236 10*3/uL (ref 150.0–400.0)
RBC: 5.34 Mil/uL (ref 4.22–5.81)
RDW: 13.6 % (ref 11.5–15.5)
WBC: 7.1 10*3/uL (ref 4.0–10.5)

## 2018-09-06 LAB — IBC PANEL
Iron: 56 ug/dL (ref 42–165)
Saturation Ratios: 15 % — ABNORMAL LOW (ref 20.0–50.0)
Transferrin: 266 mg/dL (ref 212.0–360.0)

## 2018-09-06 LAB — FERRITIN: Ferritin: 46.1 ng/mL (ref 22.0–322.0)

## 2018-09-06 MED ORDER — HYDROCORTISONE ACETATE 25 MG RE SUPP: 25.0000 mg | Freq: Every day | RECTAL | 2 refills | Status: DC

## 2018-09-06 MED ORDER — AMBULATORY NON FORMULARY MEDICATION: 2 refills | Status: DC

## 2018-09-06 NOTE — Patient Instructions (Signed)
Your provider has requested that you go to the basement level for lab work before leaving today. Press "B" on the elevator. The lab is located at the first door on the left as you exit the elevator.  Please purchase the following medications over the counter and take as directed: Rectacare  Please start a fiber supplement 1-2 times daily. (Fibercon,Metamucil,Benefiber)  Take colace 1-2 times daily

## 2018-09-06 NOTE — Progress Notes (Signed)
[place] VISIT   Primary Care Provider Hulan Fess, MD [place] [address] [phone]  Referring Provider Hulan Fess, MD [place], [place] [address] [phone]  Patient Profile: Nathaniel Turner is a 57 y.o. male with a pmh significant for MDD/Anxiety, Colon Polyps, Hemorrhoids, hx of Melanoma (s/p treatment), hx of Throat Cancer.  The patient presents to the [facility] for an evaluation and management of problem(s) noted below:  Problem List 1. Hemorrhoids, unspecified hemorrhoid type   2. Rectal bleeding   3. Diarrhea, unspecified type   4. Rectal pain     History of Present Illness: This is the patient's first visit to the outpatient [place] [facility] in years.  Previously 1 of Dr. Nichola Sizer patients.  Patient has a history of large hemorrhoids that he is at many years.  Patient states he had problems with his hemorrhoids for years.  Dr. Olevia Perches on her last endoscopy had suggested the use of Anusol as well as other medications to try and help with hemorrhoids.  The patient describes having more significant issues of bleeding currently.  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  Describes about a month ago having episodes of diarrhea eventually went and saw his primary care doctor who did stool studies and reported having a positive stool test.  We do not know what bacteria he had.  He was treated with antibiotics and had improvement in his diarrhea.  He states he eats a high-fiber diet but does not take fiber supplementation.  He does not have any issues with having a bowel movement on a daily basis.  If he actually goes to frequently he gets more irritation of his hemorrhoids.  He is wondering if surgery or ultrasound ablation may be something to be considered.  He has had rectal bleeding as a result of this now has to wear a pad  daily and his undergarments to try and minimize soilage.  He denies significant nonsteroidal use.  GI Review of Systems Positive as above infrequent pyrosis Negative for dysphagia, odynophagia, abdominal pain, bloating, melena, nausea, vomiting  Review of Systems General: Denies fevers/chills/weight loss HEENT: Denies oral lesions Cardiovascular: Denies chest pain Pulmonary: Denies shortness of breath Gastroenterological: See HPI Genitourinary: Denies darkened urine or hematuria Hematological: Denies easy bruising/bleeding Endocrine: Denies temperature intolerance Dermatological: Denies jaundice Psychological: Mood is anxious   Medications Current Outpatient Medications  Medication Sig Dispense Refill  . gabapentin (NEURONTIN) 300 MG capsule Take 300 mg by mouth 2 (two) times daily.  1  . rosuvastatin (CRESTOR) 5 MG tablet Take 5 mg by mouth daily.  1  . AMBULATORY NON FORMULARY MEDICATION Medication Name: nitroglycerin  NTG ointment  2-3 times daily 30 g 2  . hydrocortisone (ANUSOL-HC) 25 MG suppository Place 1 suppository (25 mg total) rectally at bedtime. 15 suppository 2   No current facility-administered medications for this visit.     Allergies Allergies  Allergen Reactions  . Codeine Itching  . Oxycodone Rash    Histories Past Medical History:  Diagnosis Date  . Anxiety and depression   . Bursitis    left shoulder  . Colitis    secondary to [facility] treatment  . Colitis   . Colon polyp    adenomatous  . Diabetic peripheral neuropathy ([place]) 03/07/2018  . Esophagitis    severe-due to [facility] treatment  . History of throat cancer   .  Internal hemorrhoids without mention of complication   . Melanoma ([place])    melanoma dx 2007;  . Metastatic malignant melanoma [facility])    Past Surgical History:  Procedure Laterality Date  . AXILLARY LYMPH NODE BIOPSY Right 06/17/14   hx. melanoma 2 spots bx.  Marland Kitchen MELANOMA EXCISION  2008/2015   Lymph node removal/neck and groin  .  POLYPECTOMY    . radiation treatment     7 weeks in 2013  . SHOULDER SURGERY  2010   left shoulder   Social History   Socioeconomic History  . Marital status: Married    Spouse name: Not on file  . Number of children: 1  . Years of education: 75  . Highest education level: Not on file  Occupational History  . Occupation: Land -Leisure centre manager: [employer]  Social Needs  . Financial resource strain: Not on file  . Food insecurity:    Worry: Not on file    Inability: Not on file  . Transportation needs:    Medical: Not on file    Non-medical: Not on file  Tobacco Use  . Smoking status: Former Smoker    Types: Cigarettes    Last attempt to quit: 06/07/1979    Years since quitting: 39.2  . Smokeless tobacco: Never Used  Substance and Sexual Activity  . Alcohol use: No  . Drug use: No  . Sexual activity: Not on file  Lifestyle  . Physical activity:    Days per week: Not on file    Minutes per session: Not on file  . Stress: Not on file  Relationships  . Social connections:    Talks on phone: Not on file    Gets together: Not on file    Attends religious service: Not on file    Active member of club or organization: Not on file    Attends meetings of clubs or organizations: Not on file    Relationship status: Not on file  . Intimate partner violence:    Fear of current or ex partner: Not on file    Emotionally abused: Not on file    Physically abused: Not on file    Forced sexual activity: Not on file  Other Topics Concern  . Not on file  Social History Narrative   Lives w/ spouse   Caffeine use: Sugar free energy drink   Left handed    Family History  Problem Relation Age of Onset  . Heart disease Maternal Grandmother   . Heart disease Maternal Grandfather   . Diabetes Unknown        Grandmother  . Coronary artery disease Unknown        Grandfather   I have reviewed his medical, social, and family history in detail and updated the  electronic medical record as necessary.    PHYSICAL EXAMINATION  BP (!) 152/72   Pulse 97   Ht 6\' 1"  (1.854 m)   Wt 208 lb (94.3 kg)   BMI 27.44 kg/m  Wt Readings from Last 3 Encounters:  09/06/18 208 lb (94.3 kg)  02/01/18 219 lb 8 oz (99.6 kg)  07/20/15 215 lb (97.5 kg)  GEN: Initially on evaluation in NAD, appears stated age, doesn't appear chronically ill PSYCH: Cooperative, without pressured speech EYE: Conjunctivae pink, sclerae anicteric ENT: MMM, without oral ulcers, no erythema or exudates noted NECK: Supple CV: RR without R/Gs  RESP: CTAB posteriorly, without wheezing GI: NABS, soft, NT/ND, without rebound or  guarding, no HSM appreciated GU: DRE shows large external and internal hemorrhoids with one hemorrhoid that has prolapsed significantly and is purplish in tinge with likely component of significant internal hemorrhoids as well; we proceeded with trying to reduce the more purplish hemorrhoid and rectal bleeding was noted MSK/EXT: No lower extremity edema SKIN: No jaundice, NEURO:  Alert & Oriented x 3, no focal deficits   REVIEW OF DATA  I reviewed the following data at the time of this encounter:  GI Procedures and Studies  August 2015 colonoscopy COLON FINDINGS: Mild diverticulosis was noted in the sigmoid colon. Moderate sized internal hemorrhoids were found. A diminutive flat polyp was found in the sigmoid colon. A polypectomy was performed with cold forceps. The resection was complete and the polyp tissue was completely retrieved. Retroflexed views revealed no abnormalities. The time to cecum=6 minutes 09 seconds. Withdrawal time=8 minutes 54 seconds. The scope was withdrawn and the procedure completed.  Laboratory Studies  Reviewed in epic  Imaging Studies  No relevant studies   ASSESSMENT  Mr. Bethel is a 57 y.o. male with a pmh significant for MDD/Anxiety, Colon Polyps, Hemorrhoids, hx of Melanoma (s/p treatment), hx of Throat Cancer.  The patient  is seen today for evaluation and management of:  1. Hemorrhoids, unspecified hemorrhoid type   2. Rectal bleeding   3. Diarrhea, unspecified type   4. Rectal pain    The patient has significant hemorrhoidal disease and is suffering from pain and bleeding from this.  We reduced his hemorrhoids as best we can today in the office and given him recommendations to try and optimize his hemorrhoidal care.  These are noted below.  We will send the patient for an urgent referral to colorectal surgery to consider next steps in evaluation.  These hemorrhoids are not going to be mandible.  I will consider a colonoscopy if the surgeons feel it is absolutely indicated however he is significant hemorrhoidal disease is likely to be exacerbated by colonoscopy preparation and thus I would likely suspect that would be ideal for the patient to have his hemorrhoids worked on and in 2020 we can always reevaluate his colon for an evaluation necessary for colon polyp surveillance and colon cancer screening.  All patient questions were answered, to the best of my ability, and the patient agrees to the aforementioned plan of action with follow-up as indicated.   PLAN  1. Hemorrhoids, unspecified hemorrhoid type - Fiber supplementation BID - Stool softeners BID - Miralax if stool remains hard (but not an issue currently) - Recta-Care ointment 2-4 times daily to first knuckle - Preparation H and Sitz Bathes 2-4 times daily - NTG low-dose ointment given to patient for possible compounding (if he can afford - Rx given to patient) - CRS urgent referral as these are not going to be bandable hemorrhoids  2. Rectal bleeding - CBC; Future - IBC panel; Future - Ferritin; Future - Will consider repeat colonoscopy, but he is not due for this as of yet, but if necessary before hemorrhoidal surgery will consider  3. Diarrhea, unspecified type - Get PCP records of recent stool culture data and what he was treated with  4.  Rectal pain   Orders Placed This Encounter  Procedures  . CBC  . IBC panel  . Ferritin    New Prescriptions   AMBULATORY NON FORMULARY MEDICATION    Medication Name: nitroglycerin  NTG ointment  2-3 times daily   HYDROCORTISONE (ANUSOL-HC) 25 MG SUPPOSITORY    Place  1 suppository (25 mg total) rectally at bedtime.   Modified Medications   No medications on file    Planned Follow Up: No follow-ups on file.   Justice Britain, MD [facility] Office # [phone]

## 2018-09-07 DIAGNOSIS — K6289 Other specified diseases of anus and rectum: Secondary | ICD-10-CM | POA: Insufficient documentation

## 2018-09-07 DIAGNOSIS — R197 Diarrhea, unspecified: Secondary | ICD-10-CM | POA: Insufficient documentation

## 2018-09-10 ENCOUNTER — Other Ambulatory Visit: Payer: Self-pay

## 2018-09-10 DIAGNOSIS — K625 Hemorrhage of anus and rectum: Secondary | ICD-10-CM

## 2018-09-10 DIAGNOSIS — D509 Iron deficiency anemia, unspecified: Secondary | ICD-10-CM

## 2018-09-14 ENCOUNTER — Encounter: Payer: Self-pay | Admitting: Gastroenterology

## 2018-09-14 DIAGNOSIS — Z8619 Personal history of other infectious and parasitic diseases: Secondary | ICD-10-CM

## 2018-09-14 NOTE — Progress Notes (Signed)
Review of outside records  GI pathogen panel from August 04, 2018 C. difficile toxin A/B detected toxin a otherwise rest of GI pathogen panel negative  I have updated the problem list to include C. difficile history.  We will scan records into the chart.   Justice Britain, MD [facility] Office # [phone]

## 2019-05-13 ENCOUNTER — Other Ambulatory Visit: Payer: Self-pay

## 2019-05-13 DIAGNOSIS — Z20828 Contact with and (suspected) exposure to other viral communicable diseases: Secondary | ICD-10-CM

## 2019-05-13 DIAGNOSIS — Z20822 Contact with and (suspected) exposure to covid-19: Secondary | ICD-10-CM

## 2019-05-16 ENCOUNTER — Encounter: Payer: Self-pay | Admitting: Gastroenterology

## 2019-05-16 LAB — NOVEL CORONAVIRUS, NAA: SARS-CoV-2, NAA: NOT DETECTED

## 2020-03-04 ENCOUNTER — Other Ambulatory Visit: Payer: Self-pay

## 2020-03-04 DIAGNOSIS — M25551 Pain in right hip: Secondary | ICD-10-CM | POA: Diagnosis present

## 2020-03-04 DIAGNOSIS — Z5321 Procedure and treatment not carried out due to patient leaving prior to being seen by health care provider: Secondary | ICD-10-CM | POA: Diagnosis not present

## 2020-03-05 ENCOUNTER — Emergency Department (HOSPITAL_COMMUNITY)
Admission: EM | Admit: 2020-03-05 | Discharge: 2020-03-05 | Disposition: A | Discharge status: Home / Self Care | Payer: 59 | Attending: Emergency Medicine | Admitting: Emergency Medicine

## 2020-03-05 ENCOUNTER — Other Ambulatory Visit: Payer: Self-pay

## 2020-03-05 ENCOUNTER — Encounter (HOSPITAL_COMMUNITY): Payer: Self-pay | Admitting: Emergency Medicine

## 2020-03-05 NOTE — ED Triage Notes (Signed)
Pt from home c/o right hip pain onset Sept 2021 Hx testicular surgery and feels that this maybe affecting his hip

## 2020-03-10 ENCOUNTER — Other Ambulatory Visit: Payer: Self-pay | Admitting: Sports Medicine

## 2020-03-10 DIAGNOSIS — M545 Low back pain, unspecified: Secondary | ICD-10-CM

## 2020-03-14 ENCOUNTER — Ambulatory Visit
Admission: RE | Admit: 2020-03-14 | Discharge: 2020-03-14 | Disposition: A | Discharge status: Home / Self Care | Payer: 59 | Source: Ambulatory Visit | Attending: Sports Medicine | Admitting: Sports Medicine

## 2020-03-14 ENCOUNTER — Other Ambulatory Visit: Payer: Self-pay

## 2020-03-14 DIAGNOSIS — M545 Low back pain, unspecified: Secondary | ICD-10-CM

## 2020-03-18 ENCOUNTER — Other Ambulatory Visit: Payer: Self-pay | Admitting: Sports Medicine

## 2020-03-18 DIAGNOSIS — M5440 Lumbago with sciatica, unspecified side: Secondary | ICD-10-CM

## 2020-03-25 ENCOUNTER — Other Ambulatory Visit: Payer: 59

## 2020-04-02 ENCOUNTER — Other Ambulatory Visit: Payer: 59

## 2020-05-22 ENCOUNTER — Other Ambulatory Visit: Payer: Self-pay

## 2020-05-22 ENCOUNTER — Emergency Department (HOSPITAL_BASED_OUTPATIENT_CLINIC_OR_DEPARTMENT_OTHER)
Admission: EM | Admit: 2020-05-22 | Discharge: 2020-05-22 | Disposition: A | Discharge status: Home / Self Care | Payer: 59 | Attending: Emergency Medicine | Admitting: Emergency Medicine

## 2020-05-22 ENCOUNTER — Encounter (HOSPITAL_BASED_OUTPATIENT_CLINIC_OR_DEPARTMENT_OTHER): Payer: Self-pay

## 2020-05-22 ENCOUNTER — Emergency Department (HOSPITAL_BASED_OUTPATIENT_CLINIC_OR_DEPARTMENT_OTHER): Payer: 59

## 2020-05-22 DIAGNOSIS — F17210 Nicotine dependence, cigarettes, uncomplicated: Secondary | ICD-10-CM | POA: Insufficient documentation

## 2020-05-22 DIAGNOSIS — R1084 Generalized abdominal pain: Secondary | ICD-10-CM | POA: Diagnosis present

## 2020-05-22 DIAGNOSIS — E1140 Type 2 diabetes mellitus with diabetic neuropathy, unspecified: Secondary | ICD-10-CM | POA: Diagnosis not present

## 2020-05-22 DIAGNOSIS — A084 Viral intestinal infection, unspecified: Secondary | ICD-10-CM | POA: Diagnosis not present

## 2020-05-22 DIAGNOSIS — R519 Headache, unspecified: Secondary | ICD-10-CM | POA: Insufficient documentation

## 2020-05-22 DIAGNOSIS — Z20822 Contact with and (suspected) exposure to covid-19: Secondary | ICD-10-CM | POA: Diagnosis not present

## 2020-05-22 LAB — CBC WITH DIFFERENTIAL/PLATELET
Abs Immature Granulocytes: 0.03 10*3/uL (ref 0.00–0.07)
Basophils Absolute: 0 10*3/uL (ref 0.0–0.1)
Basophils Relative: 1 %
Eosinophils Absolute: 0.3 10*3/uL (ref 0.0–0.5)
Eosinophils Relative: 4 %
HCT: 49.5 % (ref 39.0–52.0)
Hemoglobin: 16.7 g/dL (ref 13.0–17.0)
Immature Granulocytes: 0 %
Lymphocytes Relative: 10 %
Lymphs Abs: 0.8 10*3/uL (ref 0.7–4.0)
MCH: 29.7 pg (ref 26.0–34.0)
MCHC: 33.7 g/dL (ref 30.0–36.0)
MCV: 87.9 fL (ref 80.0–100.0)
Monocytes Absolute: 1 10*3/uL (ref 0.1–1.0)
Monocytes Relative: 12 %
Neutro Abs: 5.7 10*3/uL (ref 1.7–7.7)
Neutrophils Relative %: 73 %
Platelets: 243 10*3/uL (ref 150–400)
RBC: 5.63 MIL/uL (ref 4.22–5.81)
RDW: 13.2 % (ref 11.5–15.5)
WBC: 7.8 10*3/uL (ref 4.0–10.5)
nRBC: 0 % (ref 0.0–0.2)

## 2020-05-22 LAB — COMPREHENSIVE METABOLIC PANEL
ALT: 20 U/L (ref 0–44)
AST: 20 U/L (ref 15–41)
Albumin: 4.3 g/dL (ref 3.5–5.0)
Alkaline Phosphatase: 83 U/L (ref 38–126)
Anion gap: 14 (ref 5–15)
BUN: 18 mg/dL (ref 6–20)
CO2: 22 mmol/L (ref 22–32)
Calcium: 8.9 mg/dL (ref 8.9–10.3)
Chloride: 104 mmol/L (ref 98–111)
Creatinine, Ser: 1 mg/dL (ref 0.61–1.24)
GFR calc Af Amer: >60 mL/min (ref >60)
GFR calc non Af Amer: >60 mL/min (ref >60)
Glucose, Bld: 157 mg/dL — ABNORMAL HIGH (ref 70–99)
Potassium: 4 mmol/L (ref 3.5–5.1)
Sodium: 140 mmol/L (ref 135–145)
Total Bilirubin: 0.8 mg/dL (ref 0.3–1.2)
Total Protein: 7.3 g/dL (ref 6.5–8.1)

## 2020-05-22 LAB — URINALYSIS, ROUTINE W REFLEX MICROSCOPIC
Glucose, UA: NEGATIVE mg/dL
Hgb urine dipstick: NEGATIVE
Ketones, ur: NEGATIVE mg/dL
Leukocytes,Ua: NEGATIVE
Nitrite: NEGATIVE
Protein, ur: NEGATIVE mg/dL
Specific Gravity, Urine: >1.03 — ABNORMAL HIGH (ref 1.005–1.030)
pH: 5 (ref 5.0–8.0)

## 2020-05-22 LAB — SARS CORONAVIRUS 2 BY RT PCR (HOSPITAL ORDER, PERFORMED IN ~~LOC~~ HOSPITAL LAB): SARS Coronavirus 2: NEGATIVE

## 2020-05-22 LAB — LIPASE, BLOOD: Lipase: 23 U/L (ref 11–51)

## 2020-05-22 MED ORDER — ONDANSETRON HCL 4 MG/2ML IJ SOLN
4.0000 mg | Freq: Once | INTRAMUSCULAR | Status: AC
  Administered 2020-05-22: 4 mg via INTRAVENOUS
  Filled 2020-05-22: qty 2

## 2020-05-22 MED ORDER — KETOROLAC TROMETHAMINE 15 MG/ML IJ SOLN
15.0000 mg | Freq: Once | INTRAMUSCULAR | Status: AC
  Administered 2020-05-22: 15 mg via INTRAVENOUS
  Filled 2020-05-22: qty 1

## 2020-05-22 MED ORDER — SODIUM CHLORIDE 0.9 % IV BOLUS
1000.0000 mL | Freq: Once | INTRAVENOUS | Status: AC
  Administered 2020-05-22: 1000 mL via INTRAVENOUS

## 2020-05-22 MED ORDER — ONDANSETRON 4 MG PO TBDP: 4.0000 mg | ORAL_TABLET | Freq: Three times a day (TID) | ORAL | 0 refills | Status: DC | PRN

## 2020-05-22 MED ORDER — IOHEXOL 300 MG/ML  SOLN
100.0000 mL | Freq: Once | INTRAMUSCULAR | Status: AC | PRN
  Administered 2020-05-22: 100 mL via INTRAVENOUS

## 2020-05-22 NOTE — ED Triage Notes (Signed)
Pt c/o fever , abd pain , n/v/d x 2 days

## 2020-05-22 NOTE — ED Provider Notes (Signed)
[place] EMERGENCY DEPARTMENT Provider Note   CSN: [id number] Arrival date & time: 05/22/20  1640     History Chief Complaint  Patient presents with  . Emesis    Nathaniel Turner is a 59 y.o. male.  HPI 59 year old male presents with abdominal pain.  He states that he started feeling bad yesterday with vomiting, diarrhea, abdominal pain and headache.  Low-grade temperature of 99.8.  More diarrhea today, estimates about 4 episodes.  No blood in his emesis or diarrhea.  He states that he received his first Covid vaccination on 7/22 and was sore but no other symptoms.  Abdominal pain is diffuse. He is worried about dehydration. Some mild left sided back pain. No significant cough.    Past Medical History:  Diagnosis Date  . Anxiety and depression   . Bursitis    left shoulder  . Colitis    secondary to [facility] treatment  . Colitis   . Colon polyp    adenomatous  . Diabetic peripheral neuropathy ([place]) 03/07/2018  . Esophagitis    severe-due to [facility] treatment  . History of throat cancer   . Internal hemorrhoids without mention of complication   . Melanoma ([place])    melanoma dx 2007;  . Metastatic malignant melanoma [facility])     Patient Active Problem List   Diagnosis Date Noted  . History of Clostridioides difficile infection 09/14/2018  . Rectal pain 09/07/2018  . Diarrhea 09/07/2018  . Diabetic peripheral neuropathy ([place]) 03/07/2018  . Rectal bleeding 10/31/2008  . MELANOMA OF SKIN, SITE UNSPECIFIED 01/03/2008  . POLYP, COLON 01/03/2008  . Hemorrhoids 01/03/2008    Past Surgical History:  Procedure Laterality Date  . AXILLARY LYMPH NODE BIOPSY Right 06/17/14   hx. melanoma 2 spots bx.  Marland Kitchen MELANOMA EXCISION  2008/2015   Lymph node removal/neck and groin  . POLYPECTOMY    . radiation treatment     7 weeks in 2013  . SHOULDER SURGERY  2010   left shoulder       Family History  Problem Relation Age of Onset  . Heart disease Maternal Grandmother   .  Heart disease Maternal Grandfather   . Diabetes Unknown        Grandmother  . Coronary artery disease Unknown        Grandfather    Social History   Tobacco Use  . Smoking status: Former Smoker    Types: Cigarettes    Quit date: 06/07/1979    Years since quitting: 40.9  . Smokeless tobacco: Never Used  Vaping Use  . Vaping Use: Never used  Substance Use Topics  . Alcohol use: No  . Drug use: No    Home Medications Prior to Admission medications   Medication Sig Start Date End Date Taking? Authorizing Provider  AMBULATORY NON FORMULARY MEDICATION Medication Name: nitroglycerin  NTG ointment  2-3 times daily 09/06/18   Mansouraty, Telford Nab., MD  gabapentin (NEURONTIN) 300 MG capsule Take 300 mg by mouth 2 (two) times daily. 01/08/18   [provider]  hydrocortisone (ANUSOL-HC) 25 MG suppository Place 1 suppository (25 mg total) rectally at bedtime. 09/06/18   Mansouraty, Telford Nab., MD  ondansetron (ZOFRAN ODT) 4 MG disintegrating tablet Take 1 tablet (4 mg total) by mouth every 8 (eight) hours as needed. 05/22/20   Sherwood Gambler, MD  rosuvastatin (CRESTOR) 5 MG tablet Take 5 mg by mouth daily. 01/08/18   [provider]    Allergies    Codeine and  Oxycodone  Review of Systems   Review of Systems  Constitutional: Positive for fever.  Respiratory: Negative for cough and shortness of breath.   Gastrointestinal: Positive for abdominal pain, diarrhea, nausea and vomiting. Negative for blood in stool.  Genitourinary: Negative for dysuria.  All other systems reviewed and are negative.   Physical Exam Updated Vital Signs BP (!) 144/92 (BP Location: Right Arm)   Pulse 96   Temp 98.3 F (36.8 C) (Oral)   Resp 16   Ht 6\' 1"  (1.854 m)   Wt (!) 95.3 kg   SpO2 95%   BMI 27.71 kg/m   Physical Exam Vitals and nursing note reviewed.  Constitutional:      General: He is not in acute distress.    Appearance: He is well-developed. He is not ill-appearing or  diaphoretic.  HENT:     Head: Normocephalic and atraumatic.     Right Ear: External ear normal.     Left Ear: External ear normal.     Nose: Nose normal.  Eyes:     General:        Right eye: No discharge.        Left eye: No discharge.  Cardiovascular:     Rate and Rhythm: Normal rate and regular rhythm.     Heart sounds: Normal heart sounds.  Pulmonary:     Effort: Pulmonary effort is normal.     Breath sounds: Normal breath sounds.  Abdominal:     Palpations: Abdomen is soft.     Tenderness: There is generalized abdominal tenderness. There is left CVA tenderness (mild).  Musculoskeletal:     Cervical back: Neck supple.  Skin:    General: Skin is warm and dry.  Neurological:     Mental Status: He is alert.  Psychiatric:        Mood and Affect: Mood is not anxious.     ED Results / Procedures / Treatments   Labs (all labs ordered are listed, but only abnormal results are displayed) Labs Reviewed  COMPREHENSIVE METABOLIC PANEL - Abnormal; Notable for the following components:      Result Value   Glucose, Bld 157 (*)    All other components within normal limits  URINALYSIS, ROUTINE W REFLEX MICROSCOPIC - Abnormal; Notable for the following components:   Specific Gravity, Urine >1.030 (*)    Bilirubin Urine SMALL (*)    All other components within normal limits  SARS CORONAVIRUS 2 BY RT PCR (HOSPITAL ORDER, [place] [facility])  CBC WITH DIFFERENTIAL/PLATELET  LIPASE, BLOOD    EKG None  Radiology CT ABDOMEN PELVIS W CONTRAST  Result Date: 05/22/2020 CLINICAL DATA:  Nausea and vomiting EXAM: CT ABDOMEN AND PELVIS WITH CONTRAST TECHNIQUE: Multidetector CT imaging of the abdomen and pelvis was performed using the standard protocol following bolus administration of intravenous contrast. CONTRAST:  169mL OMNIPAQUE IOHEXOL 300 MG/ML  SOLN COMPARISON:  CT 05/29/2019, 07/20/2015 FINDINGS: Lower chest: Lung bases demonstrate no acute consolidation or  pleural effusion. Cardiac size within normal limits. Hepatobiliary: No focal liver abnormality is seen. No gallstones, gallbladder wall thickening, or biliary dilatation. Pancreas: Unremarkable. No pancreatic ductal dilatation or surrounding inflammatory changes. Spleen: Normal in size without focal abnormality. Adrenals/Urinary Tract: Adrenal glands are normal. No hydronephrosis. 1.9 cm slightly dense complex cyst lower pole right kidney medially without change and likely due to hemorrhagic or proteinaceous cyst. The bladder is unremarkable. Stomach/Bowel: Stomach is within normal limits. Appendix appears normal. No evidence of bowel  wall thickening, distention, or inflammatory changes. Diverticular disease of the left colon without acute inflammatory process. Vascular/Lymphatic: Mild aortic atherosclerosis. Resolution of previously noted left inguinal adenopathy. No suspicious nodes. Reproductive: Slightly heterogeneous prostate. Other: Negative for free air or free fluid. Clips in the left groin. Musculoskeletal: No acute or significant osseous findings. IMPRESSION: 1. No CT evidence for acute intra-abdominal or pelvic abnormality. 2. Diverticular disease of the left colon without acute inflammatory process. Aortic Atherosclerosis (ICD10-I70.0). Electronically Signed   By: Donavan Foil M.D.   On: 05/22/2020 23:00    Procedures Procedures (including critical care time)  Medications Ordered in ED Medications  sodium chloride 0.9 % bolus 1,000 mL (1,000 mLs Intravenous New Bag/Given 05/22/20 2255)  ketorolac (TORADOL) 15 MG/ML injection 15 mg (15 mg Intravenous Given 05/22/20 2228)  ondansetron (ZOFRAN) injection 4 mg (4 mg Intravenous Given 05/22/20 2228)  iohexol (OMNIPAQUE) 300 MG/ML solution 100 mL (100 mLs Intravenous Contrast Given 05/22/20 2241)    ED Course  I have reviewed the triage vital signs and the nursing notes.  Pertinent labs & imaging results that were available during my care of the  patient were reviewed by me and considered in my medical decision making (see chart for details).    MDM Rules/Calculators/A&P                          Patient probably has viral gastroenteritis.  His labs are reassuring.  Given his prior history of cancer in association with this new abdominal pain and low-grade fever, CT obtained and is unremarkable.  He does feel better with some fluids and Toradol/Zofran.  At this point, he appears stable for discharge home with supportive care. Final Clinical Impression(s) / ED Diagnoses Final diagnoses:  Viral gastroenteritis    Rx / DC Orders ED Discharge Orders         Ordered    ondansetron (ZOFRAN ODT) 4 MG disintegrating tablet  Every 8 hours PRN     Discontinue  Reprint     05/22/20 2338           Sherwood Gambler, MD 05/22/20 2341

## 2020-05-22 NOTE — Discharge Instructions (Signed)
If you develop worsening, continued, or recurrent abdominal pain, uncontrolled vomiting, fever, chest or back pain, or any other new/concerning symptoms then return to the ER for evaluation.  

## 2020-07-09 ENCOUNTER — Telehealth: Payer: Self-pay | Admitting: Gastroenterology

## 2020-07-09 NOTE — Telephone Encounter (Signed)
Hi Dr. Rush Landmark,  Patient called requesting a transfer of care back to [facility] due to insurance. All records are in Epic for review.   Please advise on scheduling.  Thank you

## 2020-07-10 NOTE — Telephone Encounter (Signed)
Called patient to advise and schedule left voicemail. 

## 2020-07-10 NOTE — Telephone Encounter (Signed)
Okay for patient to return to our care. I had referred the patient to [facility] surgery for hemorrhoidal disease management back when I had seen him in 2019. Please obtain any of those records and any GI records from any of the providers that he is seen in the interim since our last visit in 2019. He is due for colon cancer and colon polyp surveillance via colonoscopy as he was due in 2020. If he has not had that done elsewhere, then please work on getting him scheduled for colonoscopy as well. [facility] is okay for patient scheduling. Thanks. GM

## 2020-07-13 ENCOUNTER — Encounter: Payer: Self-pay | Admitting: Physician Assistant

## 2020-08-12 ENCOUNTER — Encounter: Payer: Self-pay | Admitting: Physician Assistant

## 2020-08-12 ENCOUNTER — Other Ambulatory Visit (INDEPENDENT_AMBULATORY_CARE_PROVIDER_SITE_OTHER): Payer: 59

## 2020-08-12 ENCOUNTER — Ambulatory Visit (INDEPENDENT_AMBULATORY_CARE_PROVIDER_SITE_OTHER): Payer: 59 | Admitting: Physician Assistant

## 2020-08-12 VITALS — BP 140/84 | HR 94 | Ht 73.0 in | Wt 207.0 lb

## 2020-08-12 DIAGNOSIS — K649 Unspecified hemorrhoids: Secondary | ICD-10-CM

## 2020-08-12 DIAGNOSIS — Z8371 Family history of colonic polyps: Secondary | ICD-10-CM

## 2020-08-12 DIAGNOSIS — R1013 Epigastric pain: Secondary | ICD-10-CM

## 2020-08-12 DIAGNOSIS — C140 Malignant neoplasm of pharynx, unspecified: Secondary | ICD-10-CM | POA: Insufficient documentation

## 2020-08-12 DIAGNOSIS — R110 Nausea: Secondary | ICD-10-CM | POA: Diagnosis not present

## 2020-08-12 LAB — CBC WITH DIFFERENTIAL/PLATELET
Basophils Absolute: 0.1 10*3/uL (ref 0.0–0.1)
Basophils Relative: 1.1 % (ref 0.0–3.0)
Eosinophils Absolute: 0.2 10*3/uL (ref 0.0–0.7)
Eosinophils Relative: 2.4 % (ref 0.0–5.0)
HCT: 43.1 % (ref 39.0–52.0)
Hemoglobin: 14.7 g/dL (ref 13.0–17.0)
Lymphocytes Relative: 20 % (ref 12.0–46.0)
Lymphs Abs: 1.3 10*3/uL (ref 0.7–4.0)
MCHC: 34.1 g/dL (ref 30.0–36.0)
MCV: 86.8 fl (ref 78.0–100.0)
Monocytes Absolute: 0.6 10*3/uL (ref 0.1–1.0)
Monocytes Relative: 10.2 % (ref 3.0–12.0)
Neutro Abs: 4.2 10*3/uL (ref 1.4–7.7)
Neutrophils Relative %: 66.3 % (ref 43.0–77.0)
Platelets: 233 10*3/uL (ref 150.0–400.0)
RBC: 4.96 Mil/uL (ref 4.22–5.81)
RDW: 14 % (ref 11.5–15.5)
WBC: 6.3 10*3/uL (ref 4.0–10.5)

## 2020-08-12 LAB — COMPREHENSIVE METABOLIC PANEL
ALT: 15 U/L (ref 0–53)
AST: 16 U/L (ref 0–37)
Albumin: 4.3 g/dL (ref 3.5–5.2)
Alkaline Phosphatase: 78 U/L (ref 39–117)
BUN: 15 mg/dL (ref 6–23)
CO2: 31 mEq/L (ref 19–32)
Calcium: 9.3 mg/dL (ref 8.4–10.5)
Chloride: 106 mEq/L (ref 96–112)
Creatinine, Ser: 0.89 mg/dL (ref 0.40–1.50)
GFR: 93.1 mL/min (ref >60.00)
Glucose, Bld: 102 mg/dL — ABNORMAL HIGH (ref 70–99)
Potassium: 4.2 mEq/L (ref 3.5–5.1)
Sodium: 141 mEq/L (ref 135–145)
Total Bilirubin: 0.4 mg/dL (ref 0.2–1.2)
Total Protein: 6.6 g/dL (ref 6.0–8.3)

## 2020-08-12 LAB — SEDIMENTATION RATE: Sed Rate: 7 mm/hr (ref 0–20)

## 2020-08-12 LAB — C-REACTIVE PROTEIN: CRP: <1 mg/dL (ref 0.5–20.0)

## 2020-08-12 MED ORDER — PANTOPRAZOLE SODIUM 40 MG PO TBEC
40.0000 mg | DELAYED_RELEASE_TABLET | Freq: Every day | ORAL | 6 refills | Status: DC
Start: 2020-08-12 — End: 2021-03-05

## 2020-08-12 NOTE — Progress Notes (Signed)
Subjective:    Patient ID: Nathaniel Turner, male    DOB: [birth year], 59 y.o.   MRN: [id number]  HPI  Nathaniel Turner is a pleasant 59 year old white male, established with Dr. Rush Landmark who was last seen here in 2019.Marland Kitchen  He is referred back today by Dr. Hulan Fess for evaluation of nausea. Patient has history of adult onset diabetes mellitus with neuropathy, prior history of C. difficile colitis, history of adenomatous colon polyps, prior episode of chemo induced colitis.  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  He also has history of an advanced melanoma for which she underwent treatment prior to 2008. His last colonoscopy here was done in 2015 per Dr. Olevia Perches with finding of mild sigmoid diverticulosis, moderate internal hemorrhoids and a small flat polyp in the sigmoid.  He has prior history of adenomatous polyps.  Patient relates that he underwent colonoscopy in 2020 per [facility].  I can see a copy of procedure from November 2020.  Dr. Bess Kinds with removal of 2 adenomatous polyps, one was in the rectum.  He was noted to have internal hemorrhoids and also had biopsies of the sigmoid colon that showed a chronic active colitis.  The polyp from the rectum which was a tubular adenoma also had a fragment of chronic active colitis.  With path review per the notes it was felt this could be infectious, secondary to diverticular disease versus IBD.  It was suggested by that gastroenterologist that he had further work-up with CTE or MRA but that was not completed. Due to an insurance change he is back here today.  He says his current symptoms have been present since around July 30.  He became acutely ill that day with nausea vomiting diarrhea fever chills.  He was concerned he may have Covid.  He was evaluated in the ER [place]/59, Covid test was negative and he was diagnosed with a gastroenteritis.  Since that time however he has had some persistent nausea  initially associated with dry heaves.  He says his stomach is just not been right since.  He had been taking regular doses of Excedrin and Aleve.  Sometimes Aleve up to 1000 mg/day which she had been doing long-term.  Over the past month he has stopped all of those medicines but is still very frequently nauseated without vomiting.  Generally he feels better after eating and worse with an empty stomach.  He denies any real abdominal pain.  Appetite has been okay weight has been stable. Marland Kitchen  He mentions that he feels that he has a recurrent hemorrhoid which will cause a small amount of bleeding intermittently.  This protrudes and he is able to push it back in.  He is having regular bowel movements. He also describes an episode that occurred last week while he was at work where he had a dizzy spell or lightheaded episode and was not able to focus for a few minutes.  He was trying to write a check and had to stop and wait.  About an hour later when he was outside while sitting down he had a very brief syncopal episode.  He is not having any recurrent symptoms since, and says he feels fine.  He has not seen his PCP. Review of Systems Pertinent positive and negative review of systems were noted in the above HPI section.  All other review of systems was otherwise negative.  Outpatient Encounter Medications as of  08/12/2020  Medication Sig  . AMBULATORY NON FORMULARY MEDICATION Medication Name: nitroglycerin  NTG ointment  2-3 times daily (Patient not taking: Reported on 08/12/2020)  . gabapentin (NEURONTIN) 300 MG capsule Take 300 mg by mouth 2 (two) times daily. (Patient not taking: Reported on 08/12/2020)  . hydrocortisone (ANUSOL-HC) 25 MG suppository Place 1 suppository (25 mg total) rectally at bedtime. (Patient not taking: Reported on 08/12/2020)  . ondansetron (ZOFRAN ODT) 4 MG disintegrating tablet Take 1 tablet (4 mg total) by mouth every 8 (eight) hours as needed. (Patient not taking: Reported on  08/12/2020)  . pantoprazole (PROTONIX) 40 MG tablet Take 1 tablet (40 mg total) by mouth daily.  . rosuvastatin (CRESTOR) 5 MG tablet Take 5 mg by mouth daily. (Patient not taking: Reported on 08/12/2020)   No facility-administered encounter medications on file as of 08/12/2020.   Allergies  Allergen Reactions  . Codeine Itching  . Oxycodone Rash   Patient Active Problem List   Diagnosis Date Noted  . Throat cancer ([place]) 08/12/2020  . History of Clostridioides difficile infection 09/14/2018  . Rectal pain 09/07/2018  . Diarrhea 09/07/2018  . Diabetic peripheral neuropathy ([place]) 03/07/2018  . Rectal bleeding 10/31/2008  . MELANOMA OF SKIN, SITE UNSPECIFIED 01/03/2008  . POLYP, COLON 01/03/2008  . Hemorrhoids 01/03/2008   Social History   Socioeconomic History  . Marital status: Married    Spouse name: Not on file  . Number of children: 1  . Years of education: 34  . Highest education level: Not on file  Occupational History  . Occupation: Land -Leisure centre manager: [employer]  Tobacco Use  . Smoking status: Former Smoker    Types: Cigarettes    Quit date: 06/07/1979    Years since quitting: 41.2  . Smokeless tobacco: Never Used  Vaping Use  . Vaping Use: Never used  Substance and Sexual Activity  . Alcohol use: No  . Drug use: No  . Sexual activity: Not on file  Other Topics Concern  . Not on file  Social History Narrative   Lives w/ spouse   Caffeine use: Sugar free energy drink   Left handed    Social Determinants of Health   Financial Resource Strain:   . Difficulty of Paying Living Expenses: Not on file  Food Insecurity:   . Worried About Charity fundraiser in the Last Year: Not on file  . Ran Out of Food in the Last Year: Not on file  Transportation Needs:   . Lack of Transportation (Medical): Not on file  . Lack of Transportation (Non-Medical): Not on file  Physical Activity:   . Days of Exercise per Week: Not on file  . Minutes  of Exercise per Session: Not on file  Stress:   . Feeling of Stress : Not on file  Social Connections:   . Frequency of Communication with Friends and Family: Not on file  . Frequency of Social Gatherings with Friends and Family: Not on file  . Attends Religious Services: Not on file  . Active Member of Clubs or Organizations: Not on file  . Attends Archivist Meetings: Not on file  . Marital Status: Not on file  Intimate Partner Violence:   . Fear of Current or Ex-Partner: Not on file  . Emotionally Abused: Not on file  . Physically Abused: Not on file  . Sexually Abused: Not on file    Nathaniel Turner family history includes Coronary artery disease in  an other family member; Diabetes in an other family member; Heart disease in his maternal grandfather and maternal grandmother.      Objective:    Vitals:   08/12/20 1415  BP: 140/84  Pulse: 94    Physical Exam Well-developed well-nourished WM in no acute distress.  Height, Weight207, BMI27.1  HEENT; nontraumatic normocephalic, EOMI, PER R LA, sclera anicteric. Oropharynx; not done Neck; supple, no JVD Cardiovascular; regular rate and rhythm with S1-S2, no murmur rub or gallop Pulmonary; Clear bilaterally Abdomen; soft, nontender, nondistended, no palpable mass or hepatosplenomegaly, bowel sounds are active Rectal;not done today  Skin; benign exam, no jaundice rash or appreciable lesions Extremities; no clubbing cyanosis or edema skin warm and dry Neuro/Psych; alert and oriented x4, grossly nonfocal mood and affect appropriate       Assessment & Plan:   #54 59 year old white male with 57-monthhistory of persistent nausea and queasiness onset after an acute gastroenteritis type illness with nausea vomiting diarrhea fever chills. Rule out chronic gastropathy, rule out peptic ulcer disease, rule out NSAID induced gastropathy or peptic ulcer disease.  Rule out other intra-abdominal inflammatory process, rule out  malignancy  #2 intermittent symptomatic hemorrhoids. #3 history of tubular adenomatous colon polyps-up-to-date with colonoscopy just done November 2020, believe will be indicated for 5-year interval follow-up 3.  Biopsies from the sigmoid colon showing a chronic active colitis-nonspecific from colonoscopy 08/2019.  Patient does not have any current symptoms suggestive of a colitis. 5.  Remote history of advanced melanoma-completed treatment 6.  History of a squamous cell pharyngeal or laryngeal cancer status post radiation 2008-complains of vague chronic dysphagia rule out possible stricture 7.  Remote history of immune checkpoint type colitis 2008 related to treatment for melanoma 8.  History of adult onset diabetes mellitus with neuropathy 9.  Prior history of C. difficile colitis  Plan--#1  patient advised to see his PCP about recent syncopal episode #2 check CBC with differential, c-Met, sed rate and CRP will also check fecal calprotectin given prior colonoscopy findings as above #3 start Protonix 40 mg p.o. every morning 4.  Patient has a prescription for Zofran ODT will use this every 6 hours as needed for nausea 5.  Patient will be scheduled for upper endoscopy with possible esophageal dilation with Dr. MRush Landmark  Procedure was discussed in detail with patient including indications risks and benefits and he is agreeable to proceed. Patient has completed COVID-19 vaccination. He had EGD is unrevealing, will need CT of the abdomen and pelvis   Makaylie Dedeaux S Raelan Burgoon PA-C 08/12/2020   Cc: LHulan Fess MD

## 2020-08-12 NOTE — Patient Instructions (Signed)
If you are age 59 or older, your body mass index should be between 23-30. Your Body mass index is 27.31 kg/m. If this is out of the aforementioned range listed, please consider follow up with your Primary Care Provider.  If you are age 18 or younger, your body mass index should be between 19-25. Your Body mass index is 27.31 kg/m. If this is out of the aformentioned range listed, please consider follow up with your Primary Care Provider.   You have been scheduled for an endoscopy. Please follow written instructions given to you at your visit today. If you use inhalers (even only as needed), please bring them with you on the day of your procedure.  Your provider has requested that you go to the basement level for lab work before leaving today. Press "B" on the elevator. The lab is located at the first door on the left as you exit the elevator.  START Pantoprazole 40 mg 1 tablet daily.  Use your Ondansetron 4 mg every 6-8 hours as needed for nausea.  Stay off Excedrin, Aleve and other NSAIDs, only take Tylenol.  Follow up pending the results of your labs and Endoscopy, or as needed.

## 2020-08-12 NOTE — Progress Notes (Signed)
Attending Physician's Attestation   I have reviewed the chart.   I agree with the Advanced Practitioner's note, impression, and recommendations with any updates as below.    Ruta Capece Mansouraty, MD [facility] Advanced Endoscopy Office # [phone]  

## 2020-08-18 LAB — CALPROTECTIN, FECAL: Calprotectin, Fecal: 52 ug/g (ref 0–120)

## 2020-08-27 ENCOUNTER — Ambulatory Visit (AMBULATORY_SURGERY_CENTER): Payer: 59 | Admitting: Gastroenterology

## 2020-08-27 ENCOUNTER — Other Ambulatory Visit: Payer: Self-pay

## 2020-08-27 ENCOUNTER — Encounter: Payer: Self-pay | Admitting: Gastroenterology

## 2020-08-27 VITALS — BP 158/84 | HR 68 | Temp 98.9°F | Resp 22 | Ht 73.0 in | Wt 207.0 lb

## 2020-08-27 DIAGNOSIS — K219 Gastro-esophageal reflux disease without esophagitis: Secondary | ICD-10-CM | POA: Diagnosis not present

## 2020-08-27 DIAGNOSIS — K317 Polyp of stomach and duodenum: Secondary | ICD-10-CM

## 2020-08-27 DIAGNOSIS — K449 Diaphragmatic hernia without obstruction or gangrene: Secondary | ICD-10-CM

## 2020-08-27 DIAGNOSIS — K2950 Unspecified chronic gastritis without bleeding: Secondary | ICD-10-CM

## 2020-08-27 DIAGNOSIS — R1310 Dysphagia, unspecified: Secondary | ICD-10-CM | POA: Diagnosis not present

## 2020-08-27 DIAGNOSIS — K259 Gastric ulcer, unspecified as acute or chronic, without hemorrhage or perforation: Secondary | ICD-10-CM | POA: Diagnosis not present

## 2020-08-27 DIAGNOSIS — K2970 Gastritis, unspecified, without bleeding: Secondary | ICD-10-CM

## 2020-08-27 DIAGNOSIS — K3189 Other diseases of stomach and duodenum: Secondary | ICD-10-CM

## 2020-08-27 DIAGNOSIS — R1013 Epigastric pain: Secondary | ICD-10-CM

## 2020-08-27 DIAGNOSIS — R110 Nausea: Secondary | ICD-10-CM

## 2020-08-27 MED ORDER — SODIUM CHLORIDE 0.9 % IV SOLN: 500.0000 mL | Freq: Once | INTRAVENOUS | Status: DC

## 2020-08-27 NOTE — Progress Notes (Signed)
VS by CW  Pt's states no medical or surgical changes since previsit or office visit.  

## 2020-08-27 NOTE — Discharge Instructions (Signed)
Resume previous medications. Handouts on findings given to patient. Await pathology for final recommendations.  TODAYS DIET: NOTHING BY MOUTH UNTIL 6PM CLEAR LIQUIDS FOR 1 HOUR 6PM-7PM SOFT FOODS 7PM UNTIL TOMORROW  YOU HAD AN ENDOSCOPIC PROCEDURE TODAY AT [facility]:   Refer to the procedure report that was given to you for any specific questions about what was found during the examination.  If the procedure report does not answer your questions, please call your gastroenterologist to clarify.  If you requested that your care partner not be given the details of your procedure findings, then the procedure report has been included in a sealed envelope for you to review at your convenience later.  YOU SHOULD EXPECT: Some feelings of bloating in the abdomen. Passage of more gas than usual.  Walking can help get rid of the air that was put into your GI tract during the procedure and reduce the bloating. If you had a lower endoscopy (such as a colonoscopy or flexible sigmoidoscopy) you may notice spotting of blood in your stool or on the toilet paper. If you underwent a bowel prep for your procedure, you may not have a normal bowel movement for a few days.  Please Note:  You might notice some irritation and congestion in your nose or some drainage.  This is from the oxygen used during your procedure.  There is no need for concern and it should clear up in a day or so.  SYMPTOMS TO REPORT IMMEDIATELY:   Following upper endoscopy (EGD)  Vomiting of blood or coffee ground material  New chest pain or pain under the shoulder blades  Painful or persistently difficult swallowing  New shortness of breath  Fever of 100F or higher  Black, tarry-looking stools  For urgent or emergent issues, a gastroenterologist can be reached at any hour by calling [phone]. Do not use MyChart messaging for urgent concerns.    DIET:  We do recommend a small meal at first, but then you may  proceed to your regular diet.  Drink plenty of fluids but you should avoid alcoholic beverages for 24 hours.  ACTIVITY:  You should plan to take it easy for the rest of today and you should NOT DRIVE or use heavy machinery until tomorrow (because of the sedation medicines used during the test).    FOLLOW UP: Our staff will call the number listed on your records 48-72 hours following your procedure to check on you and address any questions or concerns that you may have regarding the information given to you following your procedure. If we do not reach you, we will leave a message.  We will attempt to reach you two times.  During this call, we will ask if you have developed any symptoms of COVID 19. If you develop any symptoms (ie: fever, flu-like symptoms, shortness of breath, cough etc.) before then, please call [phone].  If you test positive for Covid 19 in the 2 weeks post procedure, please call and report this information to [place].    If any biopsies were taken you will be contacted by phone or by letter within the next 1-3 weeks.  Please call us at [phone] if you have not heard about the biopsies in 3 weeks.    SIGNATURES/CONFIDENTIALITY: You and/or your care partner have signed paperwork which will be entered into your electronic medical record.  These signatures attest to the fact that that the information above on your After Visit Summary has been  reviewed and is understood.  Full responsibility of the confidentiality of this discharge information lies with you and/or your care-partner. 

## 2020-08-27 NOTE — Op Note (Signed)
[facility] Patient Name: Nathaniel Turner Procedure Date: 08/27/2020 4:25 PM MRN: [id number] Endoscopist: Justice Britain , MD Age: 59 Referring MD:  Date of Birth: 12-04-1960 Gender: Male Account #: [id number] Procedure:                Upper GI endoscopy Indications:              Epigastric abdominal pain, Upper abdominal pain,                            Dysphagia Medicines:                Monitored Anesthesia Care Procedure:                Pre-Anesthesia Assessment:                           - Prior to the procedure, a History and Physical                            was performed, and patient medications and                            allergies were reviewed. The patient's tolerance of                            previous anesthesia was also reviewed. The risks                            and benefits of the procedure and the sedation                            options and risks were discussed with the patient.                            All questions were answered, and informed consent                            was obtained. Prior Anticoagulants: The patient has                            taken no previous anticoagulant or antiplatelet                            agents. ASA Grade Assessment: II - A patient with                            mild systemic disease. After reviewing the risks                            and benefits, the patient was deemed in                            satisfactory condition to undergo the procedure.  After obtaining informed consent, the endoscope was                            passed under direct vision. Throughout the                            procedure, the patient's blood pressure, pulse, and                            oxygen saturations were monitored continuously. The                            Endoscope was introduced through the mouth, and                            advanced to the second part of duodenum. The  upper                            GI endoscopy was accomplished without difficulty.                            The patient tolerated the procedure. Scope In: Scope Out: Findings:                 No gross lesions were noted in the proximal                            esophagus and in the mid esophagus. Biopsies were                            taken with a cold forceps for histology to rule out                            EoE/LoE.                           Scattered islands of salmon-colored mucosa were                            present from 44 to 45 cm. No other visible                            abnormalities were present. Biopsies were taken                            with a cold forceps for histology to rule out                            Barrett's.                           After the rest of the EGD was completed, a                            guidewire  was placed and the scope was withdrawn.                            Dilation was performed in the entire esophagus with                            a Savary dilator with no resistance at 14 mm, mild                            resistance at 16 mm and moderate resistance at 18                            mm. The dilation site was examined following                            endoscope reinsertion and showed no change.                           A 1 cm hiatal hernia was present.                           Localized mildly erythematous mucosa without                            bleeding was found in the gastric antrum and in the                            prepyloric region of the stomach.                           No other gross lesions were noted in the entire                            examined stomach. Biopsies were taken with a cold                            forceps for histology and Helicobacter pylori                            testing.                           Multiple pedunculated and semi-sessile polyps were                             found in the duodenal bulb. The polyps were 2 to                            greater than 10 mm in size. The changes in the bulb                            are likely a result of Brunner gland hyperplasia,  however adenomatous tissue needs to be ruled out.                            Biopsies were taken with a cold forceps for                            histology.                           Prominent ampulla.                           No other gross lesions were noted in the D1/D2                            angle and in the second portion of the duodenum.                            Biopsies were taken with a cold forceps for                            histology to rule out celiac disease and                            enteropathies. Complications:            No immediate complications. Estimated Blood Loss:     Estimated blood loss was minimal. Impression:               - No gross lesions in esophagus proximally.                            Biopsied.                           - Salmon-colored mucosa suspicious for                            short-segment Barrett's esophagus. Biopsied.                           - Dilation performed in the entire esophagus.                           - 1 cm hiatal hernia.                           - Erythematous mucosa in the antrum and prepyloric                            region of the stomach. No other gross lesions in                            the stomach. Biopsied.                           - Duodenal polyps likely  Erick Blinks glands but need to                            rule out adenomatous change. Biopsied.                           - Prominent ampulla.                           - No other gross lesions in D1/D2 and in the second                            portion of the duodenum. Biopsied. Recommendation:           - The patient will be observed post-procedure,                            until all discharge criteria are met.                            - Discharge patient to home.                           - Patient has a contact number available for                            emergencies. The signs and symptoms of potential                            delayed complications were discussed with the                            patient. Return to normal activities tomorrow.                            Written discharge instructions were provided to the                            patient.                           - Dilation diet as per protocol.                           - Continue present medications.                           - Await pathology results.                           - Please use Cepacol or Halls Lozenges +/-                            Chloraseptic spray for next 72-96 hours to aid in  sore thoat should you experience this.                           - Will proceed after pathology returns with a RUQUS                            (to ensure no evidence of cholelithiasis is noted                            as CT scans can miss this and to ensure no new                            biliary ductal dilation).                           - Continue PPI therapy as prescribed for now.                           - May consider addition of Carafate for a period in                            time depending on patient's symptoms.                           - Minimize use of aspirin, ibuprofen, naproxen, or                            other non-steroidal anti-inflammatory drugs as much                            as able.                           - Await pathology results.                           - If dysphagia symptoms persist consider Esophageal                            manometry +/- MBS with SLP evaluation.                           - Consideration of EUS with side-viewing endoscopy                            to evaluate the prominent ampulla may be considered                            in the  future.                           - The findings and recommendations were discussed                            with the patient.  Justice Britain, MD 08/27/2020 5:13:59 PM

## 2020-08-27 NOTE — Progress Notes (Signed)
Called to room to assist during endoscopic procedure.  Patient ID and intended procedure confirmed with present staff. Received instructions for my participation in the procedure from the performing physician.  

## 2020-08-27 NOTE — Progress Notes (Signed)
A and O x3. Report to RN. Tolerated MAC anesthesia well.Teeth unchanged after procedure.

## 2020-08-31 ENCOUNTER — Telehealth: Payer: Self-pay

## 2020-08-31 NOTE — Telephone Encounter (Signed)
  Follow up Call-  Call back number 08/27/2020  Post procedure Call Back phone  # [phone]  Permission to leave phone message Yes  Some recent data might be hidden     Patient questions:  Do you have a fever, pain , or abdominal swelling? No. Pain Score  0 *  Have you tolerated food without any problems? Yes.    Have you been able to return to your normal activities? Yes.    Do you have any questions about your discharge instructions: Diet   No. Medications  No. Follow up visit  No.  Do you have questions or concerns about your Care? No.  Actions: * If pain score is 4 or above: No action needed, pain <4. 1. Have you developed a fever since your procedure? no  2.   Have you had an respiratory symptoms (SOB or cough) since your procedure? no  3.   Have you tested positive for COVID 19 since your procedure no  4.   Have you had any family members/close contacts diagnosed with the COVID 19 since your procedure?  no   If yes to any of these questions please route to Joylene John, RN and Joella Prince, RN

## 2020-09-03 ENCOUNTER — Encounter: Payer: Self-pay | Admitting: Gastroenterology

## 2020-09-09 ENCOUNTER — Telehealth: Payer: Self-pay | Admitting: Gastroenterology

## 2020-09-09 NOTE — Telephone Encounter (Signed)
Patient calling to get path results

## 2020-09-09 NOTE — Telephone Encounter (Signed)
I tried to call the patient this afternoon but was unable to reach him. I will try to reach him on Thursday 11/18 as I remain on administrative/away time this afternoon. If patient calls please let him know that I will try to call him on Thursday or Friday but I left a voicemail to that accord. Thanks. GM

## 2020-09-09 NOTE — Telephone Encounter (Signed)
The pt is returning Dr Donneta Romberg call.  FYI

## 2020-09-10 ENCOUNTER — Telehealth: Payer: Self-pay

## 2020-09-10 DIAGNOSIS — R1013 Epigastric pain: Secondary | ICD-10-CM

## 2020-09-10 DIAGNOSIS — R110 Nausea: Secondary | ICD-10-CM

## 2020-09-10 NOTE — Telephone Encounter (Signed)
Lab order entered in Epic for Celiac Panel and Immunoglobulin Profile. Pt will come in tomorrow to have labs. Celiac disease information and gluten- free diet information placed at front desk for pt to stop by and pick up after having labs.

## 2020-09-11 ENCOUNTER — Other Ambulatory Visit: Payer: 59

## 2020-09-11 DIAGNOSIS — R1013 Epigastric pain: Secondary | ICD-10-CM

## 2020-09-11 DIAGNOSIS — R110 Nausea: Secondary | ICD-10-CM

## 2020-09-15 LAB — CELIAC PANEL 10
Antigliadin Abs, IgA: 3 units (ref 0–19)
Endomysial IgA: NEGATIVE
Gliadin IgG: 3 units (ref 0–19)
IgA/Immunoglobulin A, Serum: 88 mg/dL — ABNORMAL LOW (ref 90–386)
Tissue Transglut Ab: <2 U/mL (ref 0–5)
Transglutaminase IgA: <2 U/mL (ref 0–3)

## 2020-09-15 LAB — SPECIMEN STATUS REPORT

## 2020-09-15 LAB — IMMUNOGLOBULINS A/E/G/M, SERUM
IgE (Immunoglobulin E), Serum: <2 IU/mL — ABNORMAL LOW (ref 6–495)
IgG (Immunoglobin G), Serum: 725 mg/dL (ref 603–1613)
IgM (Immunoglobulin M), Srm: 36 mg/dL (ref 20–172)

## 2020-09-21 ENCOUNTER — Other Ambulatory Visit: Payer: Self-pay

## 2020-09-21 DIAGNOSIS — R899 Unspecified abnormal finding in specimens from other organs, systems and tissues: Secondary | ICD-10-CM

## 2020-09-23 ENCOUNTER — Other Ambulatory Visit: Payer: 59

## 2020-09-23 DIAGNOSIS — R899 Unspecified abnormal finding in specimens from other organs, systems and tissues: Secondary | ICD-10-CM

## 2020-10-01 LAB — CELIAC HLA RFLX TO ABS
DQ2 (DQA1 0501/0505,DQB1 02XX): NEGATIVE
DQ8 (DQA1 03XX, DQB1 0302): NEGATIVE

## 2020-11-10 ENCOUNTER — Ambulatory Visit: Payer: 59 | Admitting: Gastroenterology

## 2020-11-18 ENCOUNTER — Ambulatory Visit: Payer: 59 | Admitting: Gastroenterology

## 2020-11-26 ENCOUNTER — Other Ambulatory Visit: Payer: Self-pay

## 2020-11-26 ENCOUNTER — Encounter (HOSPITAL_COMMUNITY): Payer: Self-pay | Admitting: Emergency Medicine

## 2020-11-26 ENCOUNTER — Emergency Department (HOSPITAL_COMMUNITY)
Admission: EM | Admit: 2020-11-26 | Discharge: 2020-11-27 | Disposition: A | Discharge status: Home / Self Care | Payer: Managed Care, Other (non HMO) | Attending: Emergency Medicine | Admitting: Emergency Medicine

## 2020-11-26 DIAGNOSIS — Z5321 Procedure and treatment not carried out due to patient leaving prior to being seen by health care provider: Secondary | ICD-10-CM | POA: Insufficient documentation

## 2020-11-26 DIAGNOSIS — I10 Essential (primary) hypertension: Secondary | ICD-10-CM | POA: Insufficient documentation

## 2020-11-26 LAB — CBC WITH DIFFERENTIAL/PLATELET
Abs Immature Granulocytes: 0.02 10*3/uL (ref 0.00–0.07)
Basophils Absolute: 0.1 10*3/uL (ref 0.0–0.1)
Basophils Relative: 1 %
Eosinophils Absolute: 0.1 10*3/uL (ref 0.0–0.5)
Eosinophils Relative: 2 %
HCT: 44 % (ref 39.0–52.0)
Hemoglobin: 15.2 g/dL (ref 13.0–17.0)
Immature Granulocytes: 0 %
Lymphocytes Relative: 19 %
Lymphs Abs: 1.6 10*3/uL (ref 0.7–4.0)
MCH: 30.2 pg (ref 26.0–34.0)
MCHC: 34.5 g/dL (ref 30.0–36.0)
MCV: 87.3 fL (ref 80.0–100.0)
Monocytes Absolute: 0.8 10*3/uL (ref 0.1–1.0)
Monocytes Relative: 10 %
Neutro Abs: 5.6 10*3/uL (ref 1.7–7.7)
Neutrophils Relative %: 68 %
Platelets: 230 10*3/uL (ref 150–400)
RBC: 5.04 MIL/uL (ref 4.22–5.81)
RDW: 13.2 % (ref 11.5–15.5)
WBC: 8.2 10*3/uL (ref 4.0–10.5)
nRBC: 0 % (ref 0.0–0.2)

## 2020-11-26 LAB — COMPREHENSIVE METABOLIC PANEL
ALT: 20 U/L (ref 0–44)
AST: 21 U/L (ref 15–41)
Albumin: 3.8 g/dL (ref 3.5–5.0)
Alkaline Phosphatase: 75 U/L (ref 38–126)
Anion gap: 9 (ref 5–15)
BUN: 16 mg/dL (ref 6–20)
CO2: 25 mmol/L (ref 22–32)
Calcium: 8.9 mg/dL (ref 8.9–10.3)
Chloride: 107 mmol/L (ref 98–111)
Creatinine, Ser: 0.84 mg/dL (ref 0.61–1.24)
GFR, Estimated: >60 mL/min (ref >60)
Glucose, Bld: 120 mg/dL — ABNORMAL HIGH (ref 70–99)
Potassium: 4 mmol/L (ref 3.5–5.1)
Sodium: 141 mmol/L (ref 135–145)
Total Bilirubin: 0.8 mg/dL (ref 0.3–1.2)
Total Protein: 6.4 g/dL — ABNORMAL LOW (ref 6.5–8.1)

## 2020-11-26 NOTE — ED Triage Notes (Signed)
Patient arrived with EMS from home reports elevated blood pressure at home this evening BP= 192/130 , denies headache or emesis , respirations unlabored , CBG=138 , patient stated feeling "tired".

## 2020-11-27 NOTE — ED Notes (Signed)
Pt leaving AMA. Pt will be seeing PCP tomorrow.

## 2021-03-01 ENCOUNTER — Telehealth: Payer: Self-pay | Admitting: Gastroenterology

## 2021-03-01 NOTE — Telephone Encounter (Signed)
The pt has had a change in bowels over the last 5 months from "mushy" to diarrhea.  He has not been seen since 11/21.  He has been scheduled to see Ellouise Newer on 5/13.  He has a history of hemorrhoids with some rectal bleeding.

## 2021-03-01 NOTE — Telephone Encounter (Signed)
Had procedure with Mansouraty most recently, please route accordingly.

## 2021-03-01 NOTE — Telephone Encounter (Signed)
Patient called and is having issues with changes to [place] and is seeking advise. Nathaniel Turner he has taken Imodium but has not helped.

## 2021-03-05 ENCOUNTER — Other Ambulatory Visit (INDEPENDENT_AMBULATORY_CARE_PROVIDER_SITE_OTHER): Payer: Managed Care, Other (non HMO)

## 2021-03-05 ENCOUNTER — Encounter: Payer: Self-pay | Admitting: Physician Assistant

## 2021-03-05 ENCOUNTER — Ambulatory Visit (INDEPENDENT_AMBULATORY_CARE_PROVIDER_SITE_OTHER): Payer: Managed Care, Other (non HMO) | Admitting: Physician Assistant

## 2021-03-05 VITALS — BP 160/84 | HR 72 | Ht 72.0 in | Wt 208.0 lb

## 2021-03-05 DIAGNOSIS — R194 Change in bowel habit: Secondary | ICD-10-CM

## 2021-03-05 DIAGNOSIS — K921 Melena: Secondary | ICD-10-CM | POA: Diagnosis not present

## 2021-03-05 DIAGNOSIS — Z8719 Personal history of other diseases of the digestive system: Secondary | ICD-10-CM | POA: Diagnosis not present

## 2021-03-05 LAB — CBC WITH DIFFERENTIAL/PLATELET
Basophils Absolute: 0 10*3/uL (ref 0.0–0.1)
Basophils Relative: 0.2 % (ref 0.0–3.0)
Eosinophils Absolute: 0 10*3/uL (ref 0.0–0.7)
Eosinophils Relative: 0.1 % (ref 0.0–5.0)
HCT: 39.7 % (ref 39.0–52.0)
Hemoglobin: 13.6 g/dL (ref 13.0–17.0)
Lymphocytes Relative: 14.8 % (ref 12.0–46.0)
Lymphs Abs: 1 10*3/uL (ref 0.7–4.0)
MCHC: 34.2 g/dL (ref 30.0–36.0)
MCV: 87.7 fl (ref 78.0–100.0)
Monocytes Absolute: 0.5 10*3/uL (ref 0.1–1.0)
Monocytes Relative: 7.5 % (ref 3.0–12.0)
Neutro Abs: 5.3 10*3/uL (ref 1.4–7.7)
Neutrophils Relative %: 77.4 % — ABNORMAL HIGH (ref 43.0–77.0)
Platelets: 281 10*3/uL (ref 150.0–400.0)
RBC: 4.52 Mil/uL (ref 4.22–5.81)
RDW: 14 % (ref 11.5–15.5)
WBC: 6.9 10*3/uL (ref 4.0–10.5)

## 2021-03-05 LAB — COMPREHENSIVE METABOLIC PANEL
ALT: 18 U/L (ref 0–53)
AST: 14 U/L (ref 0–37)
Albumin: 4.5 g/dL (ref 3.5–5.2)
Alkaline Phosphatase: 77 U/L (ref 39–117)
BUN: 20 mg/dL (ref 6–23)
CO2: 29 mEq/L (ref 19–32)
Calcium: 9.6 mg/dL (ref 8.4–10.5)
Chloride: 101 mEq/L (ref 96–112)
Creatinine, Ser: 0.88 mg/dL (ref 0.40–1.50)
GFR: 93.59 mL/min (ref >60.00)
Glucose, Bld: 183 mg/dL — ABNORMAL HIGH (ref 70–99)
Potassium: 4.3 mEq/L (ref 3.5–5.1)
Sodium: 139 mEq/L (ref 135–145)
Total Bilirubin: 0.5 mg/dL (ref 0.2–1.2)
Total Protein: 7.2 g/dL (ref 6.0–8.3)

## 2021-03-05 MED ORDER — NA SULFATE-K SULFATE-MG SULF 17.5-3.13-1.6 GM/177ML PO SOLN: 1.0000 | Freq: Once | ORAL | 0 refills | Status: AC

## 2021-03-05 NOTE — Patient Instructions (Signed)
If you are age 60 or older, your body mass index should be between 23-30. Your Body mass index is 28.21 kg/m. If this is out of the aforementioned range listed, please consider follow up with your Primary Care Provider.  If you are age 32 or younger, your body mass index should be between 19-25. Your Body mass index is 28.21 kg/m. If this is out of the aformentioned range listed, please consider follow up with your Primary Care Provider.   You have been scheduled for a colonoscopy. Please follow written instructions given to you at your visit today.  Please pick up your prep supplies at the pharmacy within the next 1-3 days. If you use inhalers (even only as needed), please bring them with you on the day of your procedure.  Due to recent changes in healthcare laws, you may see the results of your imaging and laboratory studies on MyChart before your provider has had a chance to review them.  We understand that in some cases there may be results that are confusing or concerning to you. Not all laboratory results come back in the same time frame and the provider may be waiting for multiple results in order to interpret others.  Please give [place] 48 hours in order for your provider to thoroughly review all the results before contacting the office for clarification of your results.   Thank you for choosing me and [facility].  Ellouise Newer, PA-C

## 2021-03-05 NOTE — Progress Notes (Addendum)
Chief Complaint: Change in bowel habits, hematochezia  HPI:    Nathaniel Turner is a 60 year old man with a past medical history as listed below including colitis secondary to [facility] treatment, known to Dr. Rush Landmark, who was referred to me by Hulan Fess, MD for a complaint of change in bowel habits and hematochezia.      06/20/2014 colonoscopy with mild diverticulosis in the sigmoid colon, moderate sized internal hemorrhoids, diminutive flat polyp in the sigmoid colon at 25 cm, no evidence of metastatic melanoma.  Repeat recommended in 5 years    09/23/2019 colonoscopy at [facility].  We do not have results but are requesting records.    08/27/2020 EGD Dr. Rush Landmark with no gross lesions in the esophagus proximally, salmon-colored mucosa suspicious for short segment Barrett's esophagus, dilation in the entire esophagus, 1 cm hiatal hernia, erythematous mucosa in the antrum and prepyloric region of the stomach, duodenal polyps likely Brunner's glands, prominent ampulla, no other gross lesions.  It was discussed that he had no evidence of Barrett's but gastritis and gastropathy without H. pylori.  He had intestinal villi blunting and there was question of underlying celiac disease.  Celiac testing was negative although biopsies looked like celiac disease so he was kept on a gluten-free diet.    Today, the patient tells me that about 5 months ago he was found to have high blood pressure and started on a new medicine.  With this he noticed that his bowel movements changed slightly afterwards, initially just a pudding consistency for a period of time and then just watery stools and now "just flaky bits with blood".  This has been happening for the past month now.  He shows me a picture on his phone.  Denies abdominal or rectal pain.    Denies fever, chills or weight loss.  Past Medical History:  Diagnosis Date  . Anxiety and depression   . Bursitis    left shoulder  . Colitis    secondary to [facility]  treatment  . Colitis   . Colon polyp    adenomatous  . Diabetic peripheral neuropathy ([place]) 03/07/2018  . Esophagitis    severe-due to [facility] treatment  . History of throat cancer   . Internal hemorrhoids without mention of complication   . Melanoma ([place])    melanoma dx 2007;  . Metastatic malignant melanoma [facility])     Past Surgical History:  Procedure Laterality Date  . AXILLARY LYMPH NODE BIOPSY Right 06/17/14   hx. melanoma 2 spots bx.  Marland Kitchen MELANOMA EXCISION  2008/2015   Lymph node removal/neck and groin  . POLYPECTOMY    . radiation treatment     7 weeks in 2013  . SHOULDER SURGERY  2010   left shoulder    Current Outpatient Medications  Medication Sig Dispense Refill  . hydrochlorothiazide (HYDRODIURIL) 12.5 MG tablet Take 12.5 mg by mouth daily.    Marland Kitchen losartan (COZAAR) 100 MG tablet Take 100 mg by mouth daily.    . metFORMIN (GLUCOPHAGE) 500 MG tablet Take 500 mg by mouth daily with breakfast.    . rosuvastatin (CRESTOR) 10 MG tablet Take 10 mg by mouth daily.     No current facility-administered medications for this visit.    Allergies as of 03/05/2021 - Review Complete 03/05/2021  Allergen Reaction Noted  . Codeine Itching 02/24/2011  . Oxycodone Rash 03/22/2011    Family History  Problem Relation Age of Onset  . Heart disease Maternal Grandmother   . Heart  disease Maternal Grandfather   . Diabetes Other        Grandmother  . Coronary artery disease Other        Grandfather  . Colon cancer Neg Hx   . Esophageal cancer Neg Hx   . Stomach cancer Neg Hx   . Rectal cancer Neg Hx     Social History   Socioeconomic History  . Marital status: Married    Spouse name: Not on file  . Number of children: 1  . Years of education: 69  . Highest education level: Not on file  Occupational History  . Occupation: Land -Leisure centre manager: [employer]  Tobacco Use  . Smoking status: Former Smoker    Types: Cigarettes    Quit date: 06/07/1979     Years since quitting: 41.7  . Smokeless tobacco: Never Used  Vaping Use  . Vaping Use: Never used  Substance and Sexual Activity  . Alcohol use: No  . Drug use: No  . Sexual activity: Not on file  Other Topics Concern  . Not on file  Social History Narrative   Lives w/ spouse   Caffeine use: Sugar free energy drink   Left handed    Social Determinants of Health   Financial Resource Strain: Not on file  Food Insecurity: Not on file  Transportation Needs: Not on file  Physical Activity: Not on file  Stress: Not on file  Social Connections: Not on file  Intimate Partner Violence: Not on file    Review of Systems:    Constitutional: No weight loss, fever or chills  Cardiovascular: No chest pain Respiratory: No SOB Gastrointestinal: See HPI and otherwise negative   Physical Exam:  Vital signs: BP (!) 160/84   Pulse 72   Ht 6' (1.829 m)   Wt 208 lb (94.3 kg)   BMI 28.21 kg/m   Constitutional:   Pleasant Caucasian male appears to be in NAD, Well developed, Well nourished, alert and cooperative Respiratory: Respirations even and unlabored. Lungs clear to auscultation bilaterally.   No wheezes, crackles, or rhonchi.  Cardiovascular: Normal S1, S2. No MRG. Regular rate and rhythm. No peripheral edema, cyanosis or pallor.  Gastrointestinal:  Soft, nondistended, nontender. No rebound or guarding. Normal bowel sounds. No appreciable masses or hepatomegaly. Psychiatric: Demonstrates good judgement and reason without abnormal affect or behaviors.  RELEVANT LABS AND IMAGING: CBC    Component Value Date/Time   WBC 8.2 11/26/2020 2136   RBC 5.04 11/26/2020 2136   HGB 15.2 11/26/2020 2136   HGB 13.1 05/18/2011 1507   HCT 44.0 11/26/2020 2136   HCT 38.6 05/18/2011 1507   PLT 230 11/26/2020 2136   PLT 255 05/18/2011 1507   MCV 87.3 11/26/2020 2136   MCV 89.6 05/18/2011 1507   MCH 30.2 11/26/2020 2136   MCHC 34.5 11/26/2020 2136   RDW 13.2 11/26/2020 2136   RDW 16.2 (H)  05/18/2011 1507   LYMPHSABS 1.6 11/26/2020 2136   LYMPHSABS 1.7 05/18/2011 1507   MONOABS 0.8 11/26/2020 2136   MONOABS 0.7 05/18/2011 1507   EOSABS 0.1 11/26/2020 2136   EOSABS 0.1 05/18/2011 1507   BASOSABS 0.1 11/26/2020 2136   BASOSABS 0.0 05/18/2011 1507    CMP     Component Value Date/Time   NA 141 11/26/2020 2136   K 4.0 11/26/2020 2136   CL 107 11/26/2020 2136   CO2 25 11/26/2020 2136   GLUCOSE 120 (H) 11/26/2020 2136   BUN 16 11/26/2020 2136  CREATININE 0.84 11/26/2020 2136   CALCIUM 8.9 11/26/2020 2136   PROT 6.4 (L) 11/26/2020 2136   PROT 6.8 03/07/2018 1648   ALBUMIN 3.8 11/26/2020 2136   AST 21 11/26/2020 2136   ALT 20 11/26/2020 2136   ALKPHOS 75 11/26/2020 2136   BILITOT 0.8 11/26/2020 2136   GFRNONAA >60 11/26/2020 2136   GFRAA >60 05/22/2020 1700    Assessment: 1.  Change in bowel habits: Was watery for a period of time, now flaky with blood 2.  Hematochezia: Started 5 months ago with above; consider colitis versus hemorrhoids versus other 3.  History of colitis: Related to a chemotherapy was on in 2015, not sure if this is present on colonoscopy in 2020 as we do not have the report yet  Plan: 1.  Scheduled patient for diagnostic colonoscopy in the [place] with Dr. Rush Landmark.  Did provide the patient with a detailed list of risks for the procedure and he agrees to proceed. 2.  Ordered CBC and CMP today. 3.  We will try to obtain records from colonoscopy in 2020 at [facility]. 4.  Patient to follow in clinic per recommendations from Dr. Rush Landmark after time of procedure.  Ellouise Newer, PA-C [facility] 03/05/2021, 1:34 PM  Cc: Hulan Fess, MD   Addendum: 03/23/2021 3:13 PM  Received records from [facility].  09/23/2019 colonoscopy done for high risk of personal history of colon polyps, findings of 2 subcentimeter polyps-completely removed, patchy erythema in the sigmoid colon-biopsied and external and  internal hemorrhoids.  Repeat recommended in 5 years given fair prep of cecum/ascending colon with a 2-day bowel prep.  Dr. Sammuel Bailiff.  Pathology showed a tubular adenoma in the ascending colon and chronic active colitis in the sigmoid colon as well as a tubular adenoma in the rectum.  We will call the patient and recommended 2-day bowel prep given results from this most recent colonoscopy.  Ellouise Newer, PA-C

## 2021-03-08 NOTE — Progress Notes (Signed)
Attending Physician's Attestation   I have reviewed the chart.   I agree with the Advanced Practitioner's note, impression, and recommendations with any updates as below.    Reianna Batdorf Mansouraty, MD [facility] Advanced Endoscopy Office # [phone]  

## 2021-03-24 ENCOUNTER — Telehealth: Payer: Self-pay

## 2021-03-24 NOTE — Telephone Encounter (Signed)
Spoke with patient, he is aware that records were reviewed and Anderson Malta is recommending a 2 day bowel prep for upcoming procedure. Patient is aware that he will have updated instructions. Patient will come by the office this afternoon to pick up new instructions. Patient verbalized understanding and had no concerns at the end of the call.

## 2021-03-24 NOTE — Telephone Encounter (Signed)
-----   Message from [place], [place] sent at 03/23/2021  3:16 PM EDT ----- Regarding: 2 day boel prep Can you call this patient, I received his records from most recent colonoscopy in 2020 and that was incomplete due to poor bowel prep.  I would like him to do 2-day bowel prep so that we can make sure this 1 is clean.  Thanks, JL L

## 2021-03-31 ENCOUNTER — Encounter: Payer: Self-pay | Admitting: Certified Registered Nurse Anesthetist

## 2021-04-01 ENCOUNTER — Other Ambulatory Visit: Payer: Self-pay

## 2021-04-01 ENCOUNTER — Ambulatory Visit (AMBULATORY_SURGERY_CENTER): Payer: Managed Care, Other (non HMO) | Admitting: Gastroenterology

## 2021-04-01 ENCOUNTER — Other Ambulatory Visit (INDEPENDENT_AMBULATORY_CARE_PROVIDER_SITE_OTHER): Payer: Managed Care, Other (non HMO)

## 2021-04-01 ENCOUNTER — Encounter: Payer: Self-pay | Admitting: Gastroenterology

## 2021-04-01 VITALS — BP 120/71 | HR 65 | Temp 98.4°F | Resp 12 | Ht 72.0 in | Wt 208.0 lb

## 2021-04-01 DIAGNOSIS — K642 Third degree hemorrhoids: Secondary | ICD-10-CM

## 2021-04-01 DIAGNOSIS — D124 Benign neoplasm of descending colon: Secondary | ICD-10-CM

## 2021-04-01 DIAGNOSIS — R194 Change in bowel habit: Secondary | ICD-10-CM | POA: Diagnosis not present

## 2021-04-01 DIAGNOSIS — D120 Benign neoplasm of cecum: Secondary | ICD-10-CM | POA: Diagnosis not present

## 2021-04-01 DIAGNOSIS — D123 Benign neoplasm of transverse colon: Secondary | ICD-10-CM

## 2021-04-01 DIAGNOSIS — Z8719 Personal history of other diseases of the digestive system: Secondary | ICD-10-CM

## 2021-04-01 DIAGNOSIS — K5730 Diverticulosis of large intestine without perforation or abscess without bleeding: Secondary | ICD-10-CM

## 2021-04-01 DIAGNOSIS — K529 Noninfective gastroenteritis and colitis, unspecified: Secondary | ICD-10-CM

## 2021-04-01 LAB — SEDIMENTATION RATE: Sed Rate: 7 mm/hr (ref 0–20)

## 2021-04-01 LAB — HIGH SENSITIVITY CRP: CRP, High Sensitivity: 1.98 mg/L (ref 0.000–5.000)

## 2021-04-01 MED ORDER — HYDROCORTISONE (PERIANAL) 2.5 % EX CREA: 1.0000 "application " | TOPICAL_CREAM | Freq: Every evening | CUTANEOUS | 1 refills | Status: DC

## 2021-04-01 MED ORDER — SODIUM CHLORIDE 0.9 % IV SOLN: 500.0000 mL | Freq: Once | INTRAVENOUS | Status: DC

## 2021-04-01 MED ORDER — HYDROCORTISONE ACETATE 25 MG RE SUPP: 25.0000 mg | RECTAL | 1 refills | Status: DC

## 2021-04-01 NOTE — Progress Notes (Signed)
VS by [facility]  I have reviewed the patient's medical history in detail and updated the computerized patient record.

## 2021-04-01 NOTE — Progress Notes (Signed)
Called to room to assist during endoscopic procedure.  Patient ID and intended procedure confirmed with present staff. Received instructions for my participation in the procedure from the performing physician.  

## 2021-04-01 NOTE — Patient Instructions (Signed)
Handout given:  polyps, hemorrhoids,  Start anusol cream nightly for 1 week then every other night til prescription is done Use benefiber or metamucil 1-2 times a day Repeat colonoscopy in 1 year    YOU HAD AN ENDOSCOPIC PROCEDURE TODAY AT [facility]:   Refer to the procedure report that was given to you for any specific questions about what was found during the examination.  If the procedure report does not answer your questions, please call your gastroenterologist to clarify.  If you requested that your care partner not be given the details of your procedure findings, then the procedure report has been included in a sealed envelope for you to review at your convenience later.  YOU SHOULD EXPECT: Some feelings of bloating in the abdomen. Passage of more gas than usual.  Walking can help get rid of the air that was put into your GI tract during the procedure and reduce the bloating. If you had a lower endoscopy (such as a colonoscopy or flexible sigmoidoscopy) you may notice spotting of blood in your stool or on the toilet paper. If you underwent a bowel prep for your procedure, you may not have a normal bowel movement for a few days.  Please Note:  You might notice some irritation and congestion in your nose or some drainage.  This is from the oxygen used during your procedure.  There is no need for concern and it should clear up in a day or so.  SYMPTOMS TO REPORT IMMEDIATELY:  Following lower endoscopy (colonoscopy or flexible sigmoidoscopy):  Excessive amounts of blood in the stool  Significant tenderness or worsening of abdominal pains  Swelling of the abdomen that is new, acute  Fever of 100F or higher  For urgent or emergent issues, a gastroenterologist can be reached at any hour by calling [phone]. Do not use MyChart messaging for urgent concerns.   DIET:  We do recommend a small meal at first, but then you may proceed to your regular diet.  Drink plenty of  fluids but you should avoid alcoholic beverages for 24 hours.  ACTIVITY:  You should plan to take it easy for the rest of today and you should NOT DRIVE or use heavy machinery until tomorrow (because of the sedation medicines used during the test).    FOLLOW UP: Our staff will call the number listed on your records 48-72 hours following your procedure to check on you and address any questions or concerns that you may have regarding the information given to you following your procedure. If we do not reach you, we will leave a message.  We will attempt to reach you two times.  During this call, we will ask if you have developed any symptoms of COVID 19. If you develop any symptoms (ie: fever, flu-like symptoms, shortness of breath, cough etc.) before then, please call [phone].  If you test positive for Covid 19 in the 2 weeks post procedure, please call and report this information to [place].    If any biopsies were taken you will be contacted by phone or by letter within the next 1-3 weeks.  Please call us at [phone] if you have not heard about the biopsies in 3 weeks.   SIGNATURES/CONFIDENTIALITY: You and/or your care partner have signed paperwork which will be entered into your electronic medical record.  These signatures attest to the fact that that the information above on your After Visit Summary has been reviewed and is understood.  Full responsibility of the confidentiality of this discharge information lies with you and/or your care-partner.  

## 2021-04-01 NOTE — Op Note (Signed)
[facility] Patient Name: Nathaniel Turner Procedure Date: 04/01/2021 2:44 PM MRN: [id number] Endoscopist: Justice Britain , MD Age: 60 Referring MD:  Date of Birth: 12/01/1960 Gender: Male Account #: [id number] Procedure:                Colonoscopy Indications:              Change in bowel habits Medicines:                Monitored Anesthesia Care Procedure:                Pre-Anesthesia Assessment:                           - Prior to the procedure, a History and Physical                            was performed, and patient medications and                            allergies were reviewed. The patient's tolerance of                            previous anesthesia was also reviewed. The risks                            and benefits of the procedure and the sedation                            options and risks were discussed with the patient.                            All questions were answered, and informed consent                            was obtained. Prior Anticoagulants: The patient has                            taken no previous anticoagulant or antiplatelet                            agents. ASA Grade Assessment: II - A patient with                            mild systemic disease. After reviewing the risks                            and benefits, the patient was deemed in                            satisfactory condition to undergo the procedure.                           After obtaining informed consent, the colonoscope  was passed under direct vision. Throughout the                            procedure, the patient's blood pressure, pulse, and                            oxygen saturations were monitored continuously. The                            Olympus CF-HQ190L [id number]) Colonoscope was                            introduced through the anus and advanced to the 5                            cm into the ileum. The colonoscopy was  performed                            without difficulty. The patient tolerated the                            procedure. The quality of the bowel preparation was                            adequate. The terminal ileum, ileocecal valve,                            appendiceal orifice, and rectum were photographed. Scope In: 3:00:56 PM Scope Out: 3:24:06 PM Scope Withdrawal Time: 0 hours 19 minutes 47 seconds  Total Procedure Duration: 0 hours 23 minutes 10 seconds  Findings:                 The digital rectal exam findings include                            hemorrhoids. Pertinent negatives include no                            palpable rectal lesions.                           The terminal ileum and ileocecal valve appeared                            normal. Biopsies were taken with a cold forceps for                            histology to rule out chronic ileitis.                           Three sessile polyps were found in the descending                            colon, transverse colon and cecum. The polyps were  2 to 5 mm in size. These polyps were removed with a                            cold snare. Resection and retrieval were complete.                           Patchy skip regions of mild mucosal changes                            characterized by congestion (edema), erythema,                            friability and granularity were found in the                            transverse colon, at the hepatic flexure, in the                            ascending colon and in the cecum. Biopsies were                            taken with a cold forceps for histology to rule out                            chronic colitis.                           Diffuse, continuous moderate mucosal changes                            characterized by altered vascularity, erythema,                            friability and granularity were found in the                             rectum, in the recto-sigmoid colon, in the sigmoid                            colon and in the descending colon. Biopsies were                            taken from the DC/[place]/RS with a cold forceps for                            histology to rule out chronic colitis. Biopsies                            were taken from the rectum with a cold forceps for                            histology to rule out chronic proctitis.  Multiple small-mouthed diverticula were found in                            the recto-sigmoid colon and sigmoid colon.                           Non-bleeding non-thrombosed external and internal                            hemorrhoids were found during retroflexion, during                            perianal exam and during digital exam. The                            hemorrhoids were Grade III (internal hemorrhoids                            that prolapse but require manual reduction). Complications:            No immediate complications. Estimated Blood Loss:     Estimated blood loss was minimal. Impression:               - Hemorrhoids found on digital rectal exam.                           - The examined portion of the ileum was normal.                            Biopsied.                           - Three 2 to 5 mm polyps in the descending colon,                            in the transverse colon and in the cecum, removed                            with a cold snare. Resected and retrieved.                           - Patchy skip regions of mild mucosal changes were                            found in the transverse colon, at the hepatic                            flexure, in the ascending colon and in the cecum                            secondary to colitis. Biopsied.                           - Diffuse moderate mucosal changes were found in  the rectum, in the recto-sigmoid colon, in the                             sigmoid colon and in the descending colon secondary                            to colitis. Biopsied.                           - Diverticulosis in the recto-sigmoid colon and in                            the sigmoid colon.                           - Non-bleeding non-thrombosed external and internal                            hemorrhoids. Recommendation:           - The patient will be observed post-procedure,                            until all discharge criteria are met.                           - Discharge patient to home.                           - Patient has a contact number available for                            emergencies. The signs and symptoms of potential                            delayed complications were discussed with the                            patient. Return to normal activities tomorrow.                            Written discharge instructions were provided to the                            patient.                           - High fiber diet.                           - Use Benefiber or Metamucil 1-2 times daily in                            effort of bulking stool.                           - Await  pathology results.                           - Repeat colonoscopy in 1 year for surveillance                            based on pathology results.                           - Obtain an ESR/CRP today. If evidence of IBD is                            found then additional blood work will be required.                           - Initiate Anusol suppositories (Nightly x 1 week                            and then every other night until prescription is                            done) - this is for hemorrhoidal therapy.                           - Will consider Mesalamine prescription based on                            findings of pathology, especially if IBD is noted                            vs the possiblity of treatment for SCAD (though                             seems more diffuse than would be normal for that                            entity).                           - If patient has proctitis could consider Canasa                            suppositories.                           - The findings and recommendations were discussed                            with the patient.                           - The findings and recommendations were discussed                            with the patient's family. [employer],  MD 04/01/2021 3:36:39 PM

## 2021-04-01 NOTE — Progress Notes (Signed)
Report given to PACU, vss 

## 2021-04-05 ENCOUNTER — Telehealth: Payer: Self-pay | Admitting: *Deleted

## 2021-04-05 NOTE — Telephone Encounter (Signed)
  Follow up Call-  Call back number 04/01/2021 08/27/2020  Post procedure Call Back phone  # [phone]  Permission to leave phone message Yes Yes  Some recent data might be hidden     Patient questions:  Do you have a fever, pain , or abdominal swelling? No. Pain Score  0 *  Have you tolerated food without any problems? Yes.    Have you been able to return to your normal activities? Yes.    Do you have any questions about your discharge instructions: Diet   No. Medications  No. Follow up visit  No.  Do you have questions or concerns about your Care? No.  Actions: * If pain score is 4 or above: No action needed, pain <4.  Have you developed a fever since your procedure? no  2.   Have you had an respiratory symptoms (SOB or cough) since your procedure? no  3.   Have you tested positive for COVID 19 since your procedure no  4.   Have you had any family members/close contacts diagnosed with the COVID 19 since your procedure?  no   If yes to any of these questions please route to Joylene John, RN and Joella Prince, RN

## 2021-04-08 ENCOUNTER — Encounter: Payer: Self-pay | Admitting: Gastroenterology

## 2021-04-08 ENCOUNTER — Other Ambulatory Visit: Payer: Self-pay

## 2021-04-08 MED ORDER — MESALAMINE 1.2 G PO TBEC: 4.8000 g | DELAYED_RELEASE_TABLET | Freq: Every day | ORAL | 6 refills | Status: DC

## 2021-05-06 ENCOUNTER — Ambulatory Visit: Payer: Managed Care, Other (non HMO) | Admitting: Physician Assistant

## 2021-06-25 ENCOUNTER — Other Ambulatory Visit: Payer: Self-pay | Admitting: Surgery

## 2021-06-25 DIAGNOSIS — H4910 Fourth [trochlear] nerve palsy, unspecified eye: Secondary | ICD-10-CM

## 2021-06-25 DIAGNOSIS — H4900 Third [oculomotor] nerve palsy, unspecified eye: Secondary | ICD-10-CM

## 2021-07-02 ENCOUNTER — Other Ambulatory Visit: Payer: Self-pay

## 2021-07-02 ENCOUNTER — Ambulatory Visit
Admission: RE | Admit: 2021-07-02 | Discharge: 2021-07-02 | Disposition: A | Discharge status: Home / Self Care | Payer: Managed Care, Other (non HMO) | Source: Ambulatory Visit | Attending: Surgery | Admitting: Surgery

## 2021-07-02 DIAGNOSIS — H4910 Fourth [trochlear] nerve palsy, unspecified eye: Secondary | ICD-10-CM

## 2021-07-02 DIAGNOSIS — H4900 Third [oculomotor] nerve palsy, unspecified eye: Secondary | ICD-10-CM

## 2021-07-02 MED ORDER — GADOBENATE DIMEGLUMINE 529 MG/ML IV SOLN
20.0000 mL | Freq: Once | INTRAVENOUS | Status: AC | PRN
  Administered 2021-07-02: 20 mL via INTRAVENOUS

## 2021-07-11 ENCOUNTER — Other Ambulatory Visit: Payer: Managed Care, Other (non HMO)

## 2021-08-17 ENCOUNTER — Other Ambulatory Visit (HOSPITAL_BASED_OUTPATIENT_CLINIC_OR_DEPARTMENT_OTHER): Payer: Self-pay | Admitting: Ophthalmology

## 2021-08-17 ENCOUNTER — Other Ambulatory Visit: Payer: Self-pay | Admitting: Ophthalmology

## 2021-08-17 DIAGNOSIS — H02431 Paralytic ptosis of right eyelid: Secondary | ICD-10-CM

## 2021-08-17 DIAGNOSIS — H5021 Vertical strabismus, right eye: Secondary | ICD-10-CM

## 2021-08-17 DIAGNOSIS — H4912 Fourth [trochlear] nerve palsy, left eye: Secondary | ICD-10-CM

## 2021-08-23 ENCOUNTER — Other Ambulatory Visit (HOSPITAL_COMMUNITY): Payer: Self-pay | Admitting: Ophthalmology

## 2021-08-23 DIAGNOSIS — H02431 Paralytic ptosis of right eyelid: Secondary | ICD-10-CM

## 2021-08-23 DIAGNOSIS — H4912 Fourth [trochlear] nerve palsy, left eye: Secondary | ICD-10-CM

## 2021-08-23 DIAGNOSIS — H5021 Vertical strabismus, right eye: Secondary | ICD-10-CM

## 2021-08-26 ENCOUNTER — Ambulatory Visit (HOSPITAL_COMMUNITY): Payer: Managed Care, Other (non HMO)

## 2021-08-28 ENCOUNTER — Ambulatory Visit (HOSPITAL_COMMUNITY)
Admission: RE | Admit: 2021-08-28 | Discharge: 2021-08-28 | Disposition: A | Discharge status: Home / Self Care | Payer: Managed Care, Other (non HMO) | Source: Ambulatory Visit | Attending: Ophthalmology | Admitting: Ophthalmology

## 2021-08-28 ENCOUNTER — Other Ambulatory Visit: Payer: Self-pay

## 2021-08-28 DIAGNOSIS — H5021 Vertical strabismus, right eye: Secondary | ICD-10-CM | POA: Insufficient documentation

## 2021-08-28 DIAGNOSIS — H02431 Paralytic ptosis of right eyelid: Secondary | ICD-10-CM

## 2021-08-28 DIAGNOSIS — H4912 Fourth [trochlear] nerve palsy, left eye: Secondary | ICD-10-CM | POA: Diagnosis present

## 2021-08-28 MED ORDER — GADOBUTROL 1 MMOL/ML IV SOLN
10.0000 mL | Freq: Once | INTRAVENOUS | Status: AC | PRN
  Administered 2021-08-28: 10 mL via INTRAVENOUS

## 2021-11-01 ENCOUNTER — Other Ambulatory Visit: Payer: Self-pay

## 2021-11-01 MED ORDER — MESALAMINE 1.2 G PO TBEC: 4.8000 g | DELAYED_RELEASE_TABLET | Freq: Every day | ORAL | 6 refills | Status: DC

## 2022-05-04 ENCOUNTER — Other Ambulatory Visit: Payer: Self-pay

## 2022-05-04 MED ORDER — MESALAMINE 1.2 G PO TBEC: 4.8000 g | DELAYED_RELEASE_TABLET | Freq: Every day | ORAL | 0 refills | Status: DC

## 2022-07-06 ENCOUNTER — Telehealth: Payer: Self-pay | Admitting: Gastroenterology

## 2022-07-06 ENCOUNTER — Other Ambulatory Visit: Payer: Self-pay | Admitting: Family Medicine

## 2022-07-06 DIAGNOSIS — R1030 Lower abdominal pain, unspecified: Secondary | ICD-10-CM

## 2022-07-06 DIAGNOSIS — R350 Frequency of micturition: Secondary | ICD-10-CM

## 2022-07-06 NOTE — Telephone Encounter (Signed)
Inbound call from patient stating that he was having pain and thought that it was coming from his prostate. Patient went to his doctor and they ruled out that the pain is not coming from the prostate, that it is possibly coming from his intestines. Patient stated that they wanted him to do a CT scan, but he is wanting to see what he advise him to do moving forward. Please advise.

## 2022-07-06 NOTE — Telephone Encounter (Signed)
The pt has been advised that he should f/u with what his PCP is recommending for his abd pain due to history of colitis. He has no bleeding, fever, nausea or vomiting.  He has also been sent up for office visit with Amy Esterwood. He will call if his PCP thinks he needs to be seen sooner.

## 2022-07-21 ENCOUNTER — Ambulatory Visit
Admission: RE | Admit: 2022-07-21 | Discharge: 2022-07-21 | Disposition: A | Discharge status: Home / Self Care | Payer: Managed Care, Other (non HMO) | Source: Ambulatory Visit | Attending: Family Medicine | Admitting: Family Medicine

## 2022-07-21 DIAGNOSIS — R1030 Lower abdominal pain, unspecified: Secondary | ICD-10-CM

## 2022-07-21 DIAGNOSIS — R350 Frequency of micturition: Secondary | ICD-10-CM

## 2022-07-21 MED ORDER — IOPAMIDOL (ISOVUE-300) INJECTION 61%
100.0000 mL | Freq: Once | INTRAVENOUS | Status: AC | PRN
Start: 2022-07-21 — End: 2022-07-21
  Administered 2022-07-21: 100 mL via INTRAVENOUS

## 2022-08-03 ENCOUNTER — Ambulatory Visit: Payer: Managed Care, Other (non HMO) | Admitting: Physician Assistant

## 2022-08-19 ENCOUNTER — Telehealth: Payer: Self-pay | Admitting: Gastroenterology

## 2022-08-19 NOTE — Telephone Encounter (Signed)
Left message on machine to call back  

## 2022-08-19 NOTE — Telephone Encounter (Signed)
Inbound call from patient in regards to having abdominal pain and blood in stool. Please advise.

## 2022-08-22 NOTE — Telephone Encounter (Signed)
The pt states that he has had abd pain behind the belly button, loose stools, mucous in stools and just not feeling well.  No fever and no bleeding. He has been eating a very bland diet and taking Lialda 4.8 g daily.  He has been scheduled to see Ellouise Newer tomorrow but will go to the ED or UC if his symptoms worsen in the meantime.

## 2022-08-22 NOTE — Telephone Encounter (Signed)
Patient called to follow up on message from Friday, stated he did not receive any missed calls or voicemail. He is urgently seeking advise and would like a call back as soon as possible. He said he is feeling like he needs to have a BM but all that comes out is a mucus, as only had oatmeal and some cereal all weekend. Is highly concerned.

## 2022-08-23 ENCOUNTER — Ambulatory Visit (INDEPENDENT_AMBULATORY_CARE_PROVIDER_SITE_OTHER): Payer: Managed Care, Other (non HMO) | Admitting: Physician Assistant

## 2022-08-23 ENCOUNTER — Encounter: Payer: Self-pay | Admitting: Physician Assistant

## 2022-08-23 VITALS — BP 110/70 | HR 84 | Ht 72.0 in | Wt 198.0 lb

## 2022-08-23 DIAGNOSIS — R194 Change in bowel habit: Secondary | ICD-10-CM

## 2022-08-23 DIAGNOSIS — K529 Noninfective gastroenteritis and colitis, unspecified: Secondary | ICD-10-CM | POA: Diagnosis not present

## 2022-08-23 MED ORDER — NA SULFATE-K SULFATE-MG SULF 17.5-3.13-1.6 GM/177ML PO SOLN: 1.0000 | Freq: Once | ORAL | 0 refills | Status: AC

## 2022-08-23 MED ORDER — MESALAMINE 1.2 G PO TBEC: 4.8000 g | DELAYED_RELEASE_TABLET | Freq: Every day | ORAL | 11 refills | Status: DC

## 2022-08-23 NOTE — Addendum Note (Signed)
Addended by: Curlene Labrum E on: 08/23/2022 02:11 PM   Modules accepted: Orders

## 2022-08-23 NOTE — Progress Notes (Signed)
Chief Complaint: Abdominal pain and blood in stool  HPI:    Nathaniel Turner is a 61 year old male with a past medical history as listed below including likely IBD on Lialda, known to Dr. Rush Landmark, who presents to clinic today with a complaint of abdominal pain and blood in his stool.    04/01/2021 colonoscopy with hemorrhoids, examined portion ileum was normal, three 2-5 mm polyps in descending, transverse and cecum.  Patchy skip regions of mild mucosal changes were found in the transverse colon at the [facility] flexure, ascending colon and cecum secondary to colitis.  Diffuse moderate mucosal changes found in the rectum and rectosigmoid colon as well as the sigmoid colon and descending colon secondary to colitis.  Diverticulosis in the rectosigmoid colon and in the sigmoid colon.  Nonbleeding nonthrombosed external and internal hemorrhoids.  At that time recommended repeat colonoscopy in 1 year also started Anusol suppositories nightly x1 week and then every other night until prescription was done as well as discussion of considering Mesalamine prescription based on findings and pathology especially if IBD is noted versus possibility of treatment for scad.  Pathology showed tubular adenoma and inflammatory polyp but the colon biopsies and rectal biopsies showed chronic colitis.  He was offered initiation of Lialda 4.8 g daily.  Recommended he follow-up in clinic in 6 months.    03/17/2022 CMP and CBC normal.    07/21/2022 CT abdomen pelvis with contrast showed minimal sigmoid diverticulosis without evidence of diverticulitis and a tiny umbilical hernia containing fat.    08/19/2022 patient called our clinic and described abdominal pain and blood in his stool.    08/22/2022 patient called back in and described that he is feeling like he needs to have a bowel movement but all that comes out his mucus and he is only been eating oatmeal and cereal all weekend.  Noted pain behind the bellybutton and just not  feeling well.  He had been eating a very bland diet and taking Lialda 4.8 g daily.    Today, patient tells me that he has been on the Mesalamine 4 tabs a day for almost a year and this had really helped him to normalize his stools which before were a little looser and broken up.  He ran out of his medication about a month ago and around that timeframe started with some pain behind his bellybutton, he went to see his PCP who did a urinalysis and a CT which was normal other than a small umbilical hernia.  Tells me that he would eat and then get gas pain and have gas and then stool which were small pieces and unformed and sometimes had a little bit of mucus and blood in them.  That is what is still happening now.  To change to a soft diet over the weekend but still had the same problems.    Denies fever, chills, weight loss, nausea or vomiting.  Past Medical History:  Diagnosis Date   Anxiety and depression    Bursitis    left shoulder   Colitis    secondary to [facility] treatment   Colitis    Colon polyp    adenomatous   Diabetic peripheral neuropathy ([place]) 03/07/2018   Esophagitis    severe-due to [facility] treatment   History of throat cancer    Internal hemorrhoids without mention of complication    Melanoma ([place])    melanoma dx 2007;   Metastatic malignant melanoma ([place])     Past Surgical History:  Procedure Laterality Date   AXILLARY LYMPH NODE BIOPSY Right 06/17/14   hx. melanoma 2 spots bx.   MELANOMA EXCISION  2008/2015   Lymph node removal/neck and groin   POLYPECTOMY     radiation treatment     7 weeks in 2013   SHOULDER SURGERY  2010   left shoulder    Current Outpatient Medications  Medication Sig Dispense Refill   hydrochlorothiazide (HYDRODIURIL) 12.5 MG tablet Take 12.5 mg by mouth daily.     hydrocortisone (ANUSOL-HC) 25 MG suppository Place 1 suppository (25 mg total) rectally as directed. 12 suppository 1   losartan (COZAAR) 100 MG tablet Take 100 mg by mouth daily.      mesalamine (LIALDA) 1.2 g EC tablet Take 4 tablets (4.8 g total) by mouth daily with breakfast. 120 tablet 0   metFORMIN (GLUCOPHAGE) 500 MG tablet Take 500 mg by mouth daily with breakfast.     rosuvastatin (CRESTOR) 10 MG tablet Take 10 mg by mouth daily.     No current facility-administered medications for this visit.    Allergies as of 08/23/2022 - Review Complete 04/01/2021  Allergen Reaction Noted   Codeine Itching 02/24/2011   Oxycodone Rash 03/22/2011    Family History  Problem Relation Age of Onset   Heart disease Maternal Grandmother    Heart disease Maternal Grandfather    Diabetes Other        Grandmother   Coronary artery disease Other        Grandfather   Colon cancer Neg Hx    Esophageal cancer Neg Hx    Stomach cancer Neg Hx    Rectal cancer Neg Hx     Social History   Socioeconomic History   Marital status: Married    Spouse name: Not on file   Number of children: 1   Years of education: 14   Highest education level: Not on file  Occupational History   Occupation: Land -Leisure centre manager: [employer]  Tobacco Use   Smoking status: Former    Types: Cigarettes    Quit date: 06/07/1979    Years since quitting: 43.2   Smokeless tobacco: Never  Vaping Use   Vaping Use: Never used  Substance and Sexual Activity   Alcohol use: No   Drug use: No   Sexual activity: Not on file  Other Topics Concern   Not on file  Social History Narrative   Lives w/ spouse   Caffeine use: Sugar free energy drink   Left handed    Social Determinants of Health   Financial Resource Strain: Not on file  Food Insecurity: Not on file  Transportation Needs: Not on file  Physical Activity: Not on file  Stress: Not on file  Social Connections: Not on file  Intimate Partner Violence: Not on file    Review of Systems:    Constitutional: No weight loss, fever or chills Cardiovascular: No chest pain Respiratory: No SOB  Gastrointestinal: See HPI  and otherwise negative   Physical Exam:  Vital signs: BP 110/70   Pulse 84   Ht 6' (1.829 m)   Wt 198 lb (89.8 kg)   BMI 26.85 kg/m    Constitutional:   Pleasant Caucasian male appears to be in NAD, Well developed, Well nourished, alert and cooperative Respiratory: Respirations even and unlabored. Lungs clear to auscultation bilaterally.   No wheezes, crackles, or rhonchi.  Cardiovascular: Normal S1, S2. No MRG. Regular rate and rhythm. No peripheral edema,  cyanosis or pallor.  Gastrointestinal:  Soft, nondistended, mild lower abdominal ttp, small umbilical hernia. No rebound or guarding. Normal bowel sounds. No appreciable masses or hepatomegaly. Rectal:  Not performed.  Psychiatric: Demonstrates good judgement and reason without abnormal affect or behaviors.  See HPI for recent labs.  Assessment: 1.  Colitis: Diagnosed via biopsies from colonoscopy last June, typically maintained on Lialda but ran out of this medication and started with a lot of symptoms; most likely flare of IBD 2.  Abdominal pain and change in bowel habits: After he ran out of medication for colitis as above  Plan: 1.  Restarted Mesalamine 4.8 g daily #120 with 11 refills 2.  Reviewed the patient's diagnosis of IBD and what this means for him and why we are treating him with the Mesalamine.  He was not exactly sure. 3.  Recommend patient have a repeat colonoscopy as per recommendations from Dr. Rush Landmark back in June.  Scheduled patient for a diagnostic colonoscopy in the [place] in January.  Did provide the patient a detailed list of risks for the procedure and he agrees to proceed. Patient is appropriate for endoscopic procedure(s) in the ambulatory ([place]) setting.  4.  Patient will call in a couple of weeks if he is not feeling better on the Lialda.  At that point may need to discuss Budesonide or Prednisone taper.  Otherwise he will follow in clinic per recommendations after time of procedure.  Ellouise Newer,  PA-C [facility] 08/23/2022, 1:13 PM  Cc: Kristen Loader, FNP

## 2022-08-23 NOTE — Progress Notes (Signed)
Attending Physician's Attestation   I have reviewed the chart.   I agree with the Advanced Practitioner's note, impression, and recommendations with any updates as below.    Craig Wisnewski Mansouraty, MD [facility] Office # [phone]  

## 2022-08-23 NOTE — Patient Instructions (Addendum)
_______________________________________________________  If you are age 61 or older, your body mass index should be between 23-30. Your Body mass index is 26.85 kg/m. If this is out of the aforementioned range listed, please consider follow up with your Primary Care Provider.  If you are age 61 or younger, your body mass index should be between 19-25. Your Body mass index is 26.85 kg/m. If this is out of the aformentioned range listed, please consider follow up with your Primary Care Provider.   ________________________________________________________  The [facility] providers would like to encourage you to use [facility] to communicate with providers for non-urgent requests or questions.  Due to long hold times on the telephone, sending your provider a message by [facility] may be a faster and more efficient way to get a response.  Please allow 48 business hours for a response.  Please remember that this is for non-urgent requests.  _______________________________________________________  We have sent the following medications to your pharmacy for you to pick up at your convenience: Mesalamine  You have been scheduled for a colonoscopy. Please follow written instructions given to you at your visit today.  Please pick up your prep supplies at the pharmacy within the next 1-3 days. If you use inhalers (even only as needed), please bring them with you on the day of your procedure.  Please call with any questions or concerns.  It was a pleasure to see you today!  Thank you for trusting me with your gastrointestinal care!

## 2022-11-02 ENCOUNTER — Encounter: Payer: Self-pay | Admitting: Gastroenterology

## 2022-11-09 ENCOUNTER — Encounter: Payer: Self-pay | Admitting: Gastroenterology

## 2022-11-09 ENCOUNTER — Ambulatory Visit (AMBULATORY_SURGERY_CENTER): Payer: Managed Care, Other (non HMO) | Admitting: Gastroenterology

## 2022-11-09 VITALS — BP 139/66 | HR 79 | Temp 98.9°F | Resp 21 | Ht 72.0 in | Wt 198.0 lb

## 2022-11-09 DIAGNOSIS — K5730 Diverticulosis of large intestine without perforation or abscess without bleeding: Secondary | ICD-10-CM | POA: Diagnosis not present

## 2022-11-09 DIAGNOSIS — K6389 Other specified diseases of intestine: Secondary | ICD-10-CM

## 2022-11-09 DIAGNOSIS — K5289 Other specified noninfective gastroenteritis and colitis: Secondary | ICD-10-CM

## 2022-11-09 DIAGNOSIS — K641 Second degree hemorrhoids: Secondary | ICD-10-CM

## 2022-11-09 DIAGNOSIS — R194 Change in bowel habit: Secondary | ICD-10-CM

## 2022-11-09 MED ORDER — SODIUM CHLORIDE 0.9 % IV SOLN: 500.0000 mL | Freq: Once | INTRAVENOUS | Status: DC

## 2022-11-09 NOTE — Op Note (Signed)
[facility] Patient Name: Nathaniel Turner Procedure Date: 11/09/2022 8:02 AM MRN: [id number] Endoscopist: Justice Britain , MD, [id number] Age: 62 Referring MD:  Date of Birth: 04-19-1961 Gender: Male Account #: [id number] Procedure:                Colonoscopy Indications:              High risk colon cancer surveillance: Personal                            history of non-advanced adenoma, Incidental -                            Chronic ulcerative pancolitis, Incidental - Assess                            therapeutic response to therapy of chronic                            ulcerative pancolitis Medicines:                Monitored Anesthesia Care Procedure:                Pre-Anesthesia Assessment:                           - Prior to the procedure, a History and Physical                            was performed, and patient medications and                            allergies were reviewed. The patient's tolerance of                            previous anesthesia was also reviewed. The risks                            and benefits of the procedure and the sedation                            options and risks were discussed with the patient.                            All questions were answered, and informed consent                            was obtained. Prior Anticoagulants: The patient has                            taken no anticoagulant or antiplatelet agents. ASA                            Grade Assessment: III - A patient with severe  systemic disease. After reviewing the risks and                            benefits, the patient was deemed in satisfactory                            condition to undergo the procedure.                           After obtaining informed consent, the colonoscope                            was passed under direct vision. Throughout the                            procedure, the patient's blood pressure, pulse,  and                            oxygen saturations were monitored continuously. The                            CF [id number] #[id number] was introduced through the anus                            and advanced to the the cecum, identified by                            appendiceal orifice and ileocecal valve. The                            colonoscopy was performed without difficulty. The                            patient tolerated the procedure. The quality of the                            bowel preparation was adequate. The ileocecal                            valve, appendiceal orifice, and rectum were                            photographed. Scope In: 8:09:44 AM Scope Out: 8:25:56 AM Scope Withdrawal Time: 0 hours 12 minutes 21 seconds  Total Procedure Duration: 0 hours 16 minutes 12 seconds  Findings:                 The digital rectal exam findings include                            hemorrhoids. Pertinent negatives include no                            palpable rectal lesions.  Normal mucosa was found in the entire colon                            (improved from prior examination). Biopsies were                            taken with a cold forceps for histology from the                            right colon. Biopsies were taken with a cold                            forceps for histology from the left colon. Biopsies                            were taken with a cold forceps for histology from                            the rectum.                           Multiple small-mouthed diverticula were found in                            the recto-sigmoid colon, sigmoid colon and                            descending colon.                           Non-bleeding non-thrombosed external and internal                            hemorrhoids were found during retroflexion, during                            perianal exam and during digital exam. The                             hemorrhoids were Grade II (internal hemorrhoids                            that prolapse but reduce spontaneously). Complications:            No immediate complications. Estimated Blood Loss:     Estimated blood loss was minimal. Impression:               - Hemorrhoids found on digital rectal exam.                           - Normal mucosa in the entire examined colon                            (improved from prior). Biopsied for surveillance  and to assess disease activity.                           - Diverticulosis in the recto-sigmoid colon, in the                            sigmoid colon and in the descending colon.                           - Non-bleeding non-thrombosed external and internal                            hemorrhoids. Recommendation:           - The patient will be observed post-procedure,                            until all discharge criteria are met.                           - Discharge patient to home.                           - Patient has a contact number available for                            emergencies. The signs and symptoms of potential                            delayed complications were discussed with the                            patient. Return to normal activities tomorrow.                            Written discharge instructions were provided to the                            patient.                           - High fiber diet.                           - Use FiberCon 1-2 tablets PO daily.                           - Continue present medications.                           - Await pathology results.                           - Repeat colonoscopy in 3 - 5 years for                            surveillance based on pathology  results (if no                            evidence of active disease and patient is doing                            well) Sooner evaluation or repeat based on if there                            is  still having some activity within the biopsies.                           - The findings and recommendations were discussed                            with the patient.                           - The findings and recommendations were discussed                            with the patient's family. Justice Britain, MD 11/09/2022 8:32:20 AM

## 2022-11-09 NOTE — Progress Notes (Signed)
Called to room to assist during endoscopic procedure.  Patient ID and intended procedure confirmed with present staff. Received instructions for my participation in the procedure from the performing physician.

## 2022-11-09 NOTE — Progress Notes (Signed)
GASTROENTEROLOGY PROCEDURE H&P NOTE   Primary Care Physician: Kristen Loader, FNP  HPI: Nathaniel Turner is a 62 y.o. male who presents for Colonoscopy for surveillance in setting of IBD and adenomas.  Past Medical History:  Diagnosis Date   Anxiety and depression    Bursitis    left shoulder   Colitis    secondary to [facility] treatment   Colitis    Colon polyp    adenomatous   Diabetic peripheral neuropathy ([place]) 03/07/2018   Esophagitis    severe-due to [facility] treatment   History of throat cancer    Hyperlipidemia    Hypertension    Internal hemorrhoids without mention of complication    Melanoma ([place])    melanoma dx 2007;   Metastatic malignant melanoma ([place])    Past Surgical History:  Procedure Laterality Date   AXILLARY LYMPH NODE BIOPSY Right 06/17/2014   hx. melanoma 2 spots bx.   COLONOSCOPY     MELANOMA EXCISION  2008/2015   Lymph node removal/neck and groin   POLYPECTOMY     radiation treatment     7 weeks in 2013   SHOULDER SURGERY  10/24/2008   left shoulder   Current Outpatient Medications  Medication Sig Dispense Refill   folic acid (FOLVITE) 1 MG tablet Take 1 mg by mouth daily.     hydrochlorothiazide (HYDRODIURIL) 12.5 MG tablet Take 12.5 mg by mouth daily.     JARDIANCE 25 MG TABS tablet Take 25 mg by mouth daily.     losartan (COZAAR) 100 MG tablet Take 100 mg by mouth daily.     mesalamine (LIALDA) 1.2 g EC tablet Take 4 tablets (4.8 g total) by mouth daily with breakfast. 120 tablet 11   metFORMIN (GLUCOPHAGE) 500 MG tablet Take 1,000 mg by mouth daily with breakfast.     methotrexate (RHEUMATREX) 2.5 MG tablet Take 8 tablets by mouth once a week. Takes on friday     rosuvastatin (CRESTOR) 10 MG tablet Take 10 mg by mouth daily.     Current Facility-Administered Medications  Medication Dose Route Frequency Provider Last Rate Last Admin   0.9 %  sodium chloride infusion  500 mL Intravenous Once Mansouraty, Telford Nab., MD        Current  Outpatient Medications:    folic acid (FOLVITE) 1 MG tablet, Take 1 mg by mouth daily., Disp: , Rfl:    hydrochlorothiazide (HYDRODIURIL) 12.5 MG tablet, Take 12.5 mg by mouth daily., Disp: , Rfl:    JARDIANCE 25 MG TABS tablet, Take 25 mg by mouth daily., Disp: , Rfl:    losartan (COZAAR) 100 MG tablet, Take 100 mg by mouth daily., Disp: , Rfl:    mesalamine (LIALDA) 1.2 g EC tablet, Take 4 tablets (4.8 g total) by mouth daily with breakfast., Disp: 120 tablet, Rfl: 11   metFORMIN (GLUCOPHAGE) 500 MG tablet, Take 1,000 mg by mouth daily with breakfast., Disp: , Rfl:    methotrexate (RHEUMATREX) 2.5 MG tablet, Take 8 tablets by mouth once a week. Takes on friday, Disp: , Rfl:    rosuvastatin (CRESTOR) 10 MG tablet, Take 10 mg by mouth daily., Disp: , Rfl:   Current Facility-Administered Medications:    0.9 %  sodium chloride infusion, 500 mL, Intravenous, Once, Mansouraty, Telford Nab., MD Allergies  Allergen Reactions   Codeine Itching   Oxycodone Rash   Family History  Problem Relation Age of Onset   Cataracts Mother    Suicidality Father  Heart disease Maternal Grandmother    Heart disease Maternal Grandfather    Diabetes Other        Grandmother   Coronary artery disease Other        Grandfather   Colon cancer Neg Hx    Esophageal cancer Neg Hx    Stomach cancer Neg Hx    Rectal cancer Neg Hx    Social History   Socioeconomic History   Marital status: Married    Spouse name: Not on file   Number of children: 1   Years of education: 14   Highest education level: Not on file  Occupational History   Occupation: Land -Leisure centre manager: [employer]  Tobacco Use   Smoking status: Former    Types: Cigarettes    Quit date: 06/07/1979    Years since quitting: 43.4   Smokeless tobacco: Never  Vaping Use   Vaping Use: Never used  Substance and Sexual Activity   Alcohol use: No   Drug use: No   Sexual activity: Not on file  Other Topics Concern   Not  on file  Social History Narrative   Lives w/ spouse   Caffeine use: Sugar free energy drink   Left handed    Social Determinants of Health   Financial Resource Strain: Not on file  Food Insecurity: Not on file  Transportation Needs: Not on file  Physical Activity: Not on file  Stress: Not on file  Social Connections: Not on file  Intimate Partner Violence: Not on file    Physical Exam: Today's Vitals   11/09/22 0710 11/09/22 0716  BP: (!) 147/75   Pulse: 91   Temp: 98.9 F (37.2 C) 98.9 F (37.2 C)  SpO2: 98%   Weight: 198 lb (89.8 kg)   Height: 6' (1.829 m)    Body mass index is 26.85 kg/m. GEN: NAD EYE: Sclerae anicteric ENT: MMM CV: Non-tachycardic GI: Soft, NT/ND NEURO:  Alert & Oriented x 3  Lab Results: No results for input(s): "WBC", "HGB", "HCT", "PLT" in the last 72 hours. BMET No results for input(s): "NA", "K", "CL", "CO2", "GLUCOSE", "BUN", "CREATININE", "CALCIUM" in the last 72 hours. LFT No results for input(s): "PROT", "ALBUMIN", "AST", "ALT", "ALKPHOS", "BILITOT", "BILIDIR", "IBILI" in the last 72 hours. PT/INR No results for input(s): "LABPROT", "INR" in the last 72 hours.   Impression / Plan: This is a 62 y.o.male who presents for Colonoscopy for surveillance in setting of IBD and adenomas.  The risks and benefits of endoscopic evaluation/treatment were discussed with the patient and/or family; these include but are not limited to the risk of perforation, infection, bleeding, missed lesions, lack of diagnosis, severe illness requiring hospitalization, as well as anesthesia and sedation related illnesses.  The patient's history has been reviewed, patient examined, no change in status, and deemed stable for procedure.  The patient and/or family is agreeable to proceed.    Justice Britain, MD [facility] Advanced [facility]oscopy Office # [phone]

## 2022-11-09 NOTE — Progress Notes (Signed)
To pacu, VSS. Report to Rn.tb 

## 2022-11-09 NOTE — Progress Notes (Signed)
CBG was 74 patient symptomatic with Fatigue. D5W started '@KVO'$ . Will reassess CBG within 15 minutes.

## 2022-11-09 NOTE — Patient Instructions (Signed)
Handouts provided on diverticulosis, hemorrhoids and high fiber diet.   Recommend a high-fiber diet (see handout). Use FiberCon 1-2 tablets by mouth daily.  Continue present medications.  Await pathology results.  Repeat colonoscopy in 3-5 years for surveillance based on pathology results (if no evidence of active disease and patient is doing well). Sooner evaluation or repeat based on if there is still some activity within the biopsies.   YOU HAD AN ENDOSCOPIC PROCEDURE TODAY AT [facility]:   Refer to the procedure report that was given to you for any specific questions about what was found during the examination.  If the procedure report does not answer your questions, please call your gastroenterologist to clarify.  If you requested that your care partner not be given the details of your procedure findings, then the procedure report has been included in a sealed envelope for you to review at your convenience later.  YOU SHOULD EXPECT: Some feelings of bloating in the abdomen. Passage of more gas than usual.  Walking can help get rid of the air that was put into your GI tract during the procedure and reduce the bloating. If you had a lower endoscopy (such as a colonoscopy or flexible sigmoidoscopy) you may notice spotting of blood in your stool or on the toilet paper. If you underwent a bowel prep for your procedure, you may not have a normal bowel movement for a few days.  Please Note:  You might notice some irritation and congestion in your nose or some drainage.  This is from the oxygen used during your procedure.  There is no need for concern and it should clear up in a day or so.  SYMPTOMS TO REPORT IMMEDIATELY:  Following lower endoscopy (colonoscopy or flexible sigmoidoscopy):  Excessive amounts of blood in the stool  Significant tenderness or worsening of abdominal pains  Swelling of the abdomen that is new, acute  Fever of 100F or higher  For urgent or emergent  issues, a gastroenterologist can be reached at any hour by calling [phone]. Do not use MyChart messaging for urgent concerns.    DIET:  We do recommend a small meal at first, but then you may proceed to your regular diet.  Drink plenty of fluids but you should avoid alcoholic beverages for 24 hours.  ACTIVITY:  You should plan to take it easy for the rest of today and you should NOT DRIVE or use heavy machinery until tomorrow (because of the sedation medicines used during the test).    FOLLOW UP: Our staff will call the number listed on your records the next business day following your procedure.  We will call around 7:15- 8:00 am to check on you and address any questions or concerns that you may have regarding the information given to you following your procedure. If we do not reach you, we will leave a message.     If any biopsies were taken you will be contacted by phone or by letter within the next 1-3 weeks.  Please call us at [phone] if you have not heard about the biopsies in 3 weeks.    SIGNATURES/CONFIDENTIALITY: You and/or your care partner have signed paperwork which will be entered into your electronic medical record.  These signatures attest to the fact that that the information above on your After Visit Summary has been reviewed and is understood.  Full responsibility of the confidentiality of this discharge information lies with you and/or your care-partner.

## 2022-11-10 ENCOUNTER — Telehealth: Payer: Self-pay | Admitting: *Deleted

## 2022-11-10 NOTE — Telephone Encounter (Signed)
  Follow up Call-     11/09/2022    7:16 AM 04/01/2021    1:36 PM 08/27/2020    3:09 PM  Call back number  Post procedure Call Back phone  # [phone]  Permission to leave phone message Yes Yes Yes     Patient questions:  Do you have a fever, pain , or abdominal swelling? No. Pain Score  0 *  Have you tolerated food without any problems? Yes.    Have you been able to return to your normal activities? Yes.    Do you have any questions about your discharge instructions: Diet   No. Medications  No. Follow up visit  No.  Do you have questions or concerns about your Care? No.  Actions: * If pain score is 4 or above: No action needed, pain <4.

## 2022-11-12 ENCOUNTER — Encounter: Payer: Self-pay | Admitting: Gastroenterology

## 2022-11-14 ENCOUNTER — Other Ambulatory Visit: Payer: Self-pay

## 2022-11-14 DIAGNOSIS — K529 Noninfective gastroenteritis and colitis, unspecified: Secondary | ICD-10-CM

## 2022-11-14 DIAGNOSIS — R194 Change in bowel habit: Secondary | ICD-10-CM

## 2022-11-16 ENCOUNTER — Telehealth: Payer: Self-pay | Admitting: Gastroenterology

## 2022-11-16 MED ORDER — HYDROCORTISONE ACETATE 25 MG RE SUPP: 25.0000 mg | Freq: Every evening | RECTAL | 1 refills | Status: DC

## 2022-11-16 NOTE — Telephone Encounter (Signed)
Patient states he is having a Hemorid flare up. Would like something sent into the pharmacy if possible. Please advise.

## 2022-11-16 NOTE — Telephone Encounter (Signed)
Patty, Please follow-up tomorrow afternoon or on Friday morning to see where things stand. Thanks. GM

## 2022-11-16 NOTE — Telephone Encounter (Signed)
Left message on machine to call back  

## 2022-11-16 NOTE — Telephone Encounter (Signed)
I called and spoke with the patient this afternoon. His hemorrhoids have been more of an issue since his colonoscopy preparation completed. He has been using sitz bath's with some mild effect and using Preparation H OTC. He describes previous improvement with Anusol suppositories and I agree with this plan of action. I have gone ahead and sent the prescription for the Anusol suppositories.  They can be used nightly for 1 week and then every other night until prescription is done or if significant discomfort then use Anusol suppositories twice daily for 1 week. I have also recommended the patient to get RectiCare or lidocaine jelly to use around the perianal area and up to the first knuckle/digit of the inserting finger in an effort of trying to help with discomfort. I will have my team reach out to the patient in the next 24 to 48 hours to see how he is doing, and if he still having issues we may end up needing him to go and be seen by surgery for concern of potential thrombosed hemorrhoid. He will keep [place] up-to-date as well.   Justice Britain, MD [facility] Advanced Endoscopy Office # [phone]

## 2022-11-16 NOTE — Telephone Encounter (Signed)
Pt has a history of hemorrhoids and had colon on 11/09/22. He has had a flare with pain in the rectum that feels like needles.  He has no bleeding but says the pain goes down the back of his legs.  He has been using prep H 4 times daily with no relief as well as sitz baths 3-4 times daily. He says the hemorrhoid feels really hard.  Hot baths helps but only for a few  min.  He would like to have anusol supp sent to the pharmacy. He has used that in the past and that seems to help.  His bowels are normal for him.  Soft stools no constipation.

## 2022-11-18 NOTE — Telephone Encounter (Signed)
I have spoken with the pt and he tells me that he is doing very well with his hemorrhoids after the anusol.  He has no pain at this time and will continue treatment as outlined.  He will call id he does not continue to improve.

## 2022-11-18 NOTE — Telephone Encounter (Signed)
Left message on machine to call back  

## 2023-03-03 ENCOUNTER — Encounter (HOSPITAL_COMMUNITY): Payer: Self-pay

## 2023-03-03 ENCOUNTER — Observation Stay (HOSPITAL_COMMUNITY)
Admission: EM | Admit: 2023-03-03 | Discharge: 2023-03-04 | Disposition: A | Discharge status: Home / Self Care | Payer: Managed Care, Other (non HMO) | Attending: Cardiovascular Disease | Admitting: Cardiovascular Disease

## 2023-03-03 ENCOUNTER — Emergency Department (HOSPITAL_COMMUNITY): Payer: Managed Care, Other (non HMO)

## 2023-03-03 DIAGNOSIS — I4892 Unspecified atrial flutter: Secondary | ICD-10-CM | POA: Diagnosis not present

## 2023-03-03 DIAGNOSIS — Z79899 Other long term (current) drug therapy: Secondary | ICD-10-CM | POA: Insufficient documentation

## 2023-03-03 DIAGNOSIS — Z7984 Long term (current) use of oral hypoglycemic drugs: Secondary | ICD-10-CM | POA: Diagnosis not present

## 2023-03-03 DIAGNOSIS — R7989 Other specified abnormal findings of blood chemistry: Secondary | ICD-10-CM | POA: Diagnosis not present

## 2023-03-03 DIAGNOSIS — I959 Hypotension, unspecified: Secondary | ICD-10-CM | POA: Diagnosis not present

## 2023-03-03 DIAGNOSIS — Z85820 Personal history of malignant melanoma of skin: Secondary | ICD-10-CM | POA: Insufficient documentation

## 2023-03-03 DIAGNOSIS — E7800 Pure hypercholesterolemia, unspecified: Secondary | ICD-10-CM | POA: Insufficient documentation

## 2023-03-03 DIAGNOSIS — I10 Essential (primary) hypertension: Secondary | ICD-10-CM | POA: Insufficient documentation

## 2023-03-03 DIAGNOSIS — I483 Typical atrial flutter: Secondary | ICD-10-CM | POA: Diagnosis not present

## 2023-03-03 DIAGNOSIS — I110 Hypertensive heart disease with heart failure: Secondary | ICD-10-CM | POA: Insufficient documentation

## 2023-03-03 DIAGNOSIS — I509 Heart failure, unspecified: Secondary | ICD-10-CM | POA: Diagnosis not present

## 2023-03-03 DIAGNOSIS — E1151 Type 2 diabetes mellitus with diabetic peripheral angiopathy without gangrene: Secondary | ICD-10-CM | POA: Diagnosis not present

## 2023-03-03 DIAGNOSIS — R55 Syncope and collapse: Secondary | ICD-10-CM | POA: Diagnosis not present

## 2023-03-03 DIAGNOSIS — E785 Hyperlipidemia, unspecified: Secondary | ICD-10-CM | POA: Insufficient documentation

## 2023-03-03 DIAGNOSIS — Z8521 Personal history of malignant neoplasm of larynx: Secondary | ICD-10-CM | POA: Diagnosis not present

## 2023-03-03 DIAGNOSIS — Z87891 Personal history of nicotine dependence: Secondary | ICD-10-CM | POA: Insufficient documentation

## 2023-03-03 DIAGNOSIS — R0602 Shortness of breath: Secondary | ICD-10-CM | POA: Diagnosis present

## 2023-03-03 HISTORY — DX: Type 2 diabetes mellitus without complications: E11.9

## 2023-03-03 LAB — CBC WITH DIFFERENTIAL/PLATELET
Abs Immature Granulocytes: 0.05 10*3/uL (ref 0.00–0.07)
Basophils Absolute: 0.1 10*3/uL (ref 0.0–0.1)
Basophils Relative: 1 %
Eosinophils Absolute: 0.1 10*3/uL (ref 0.0–0.5)
Eosinophils Relative: 1 %
HCT: 48.8 % (ref 39.0–52.0)
Hemoglobin: 16.1 g/dL (ref 13.0–17.0)
Immature Granulocytes: 1 %
Lymphocytes Relative: 15 %
Lymphs Abs: 1.4 10*3/uL (ref 0.7–4.0)
MCH: 30.6 pg (ref 26.0–34.0)
MCHC: 33 g/dL (ref 30.0–36.0)
MCV: 92.8 fL (ref 80.0–100.0)
Monocytes Absolute: 1 10*3/uL (ref 0.1–1.0)
Monocytes Relative: 11 %
Neutro Abs: 6.9 10*3/uL (ref 1.7–7.7)
Neutrophils Relative %: 71 %
Platelets: 254 10*3/uL (ref 150–400)
RBC: 5.26 MIL/uL (ref 4.22–5.81)
RDW: 13.4 % (ref 11.5–15.5)
WBC: 9.4 10*3/uL (ref 4.0–10.5)
nRBC: 0 % (ref 0.0–0.2)

## 2023-03-03 LAB — BASIC METABOLIC PANEL
Anion gap: 10 (ref 5–15)
BUN: 17 mg/dL (ref 8–23)
CO2: 25 mmol/L (ref 22–32)
Calcium: 9.1 mg/dL (ref 8.9–10.3)
Chloride: 107 mmol/L (ref 98–111)
Creatinine, Ser: 0.86 mg/dL (ref 0.61–1.24)
GFR, Estimated: >60 mL/min (ref >60)
Glucose, Bld: 100 mg/dL — ABNORMAL HIGH (ref 70–99)
Potassium: 3.9 mmol/L (ref 3.5–5.1)
Sodium: 142 mmol/L (ref 135–145)

## 2023-03-03 LAB — BRAIN NATRIURETIC PEPTIDE: B Natriuretic Peptide: 403.1 pg/mL — ABNORMAL HIGH (ref 0.0–100.0)

## 2023-03-03 LAB — HIV ANTIBODY (ROUTINE TESTING W REFLEX): HIV Screen 4th Generation wRfx: NONREACTIVE

## 2023-03-03 LAB — CBG MONITORING, ED: Glucose-Capillary: 85 mg/dL (ref 70–99)

## 2023-03-03 LAB — TROPONIN I (HIGH SENSITIVITY)
Troponin I (High Sensitivity): 14 ng/L (ref <18)
Troponin I (High Sensitivity): 14 ng/L (ref <18)

## 2023-03-03 LAB — MAGNESIUM: Magnesium: 2.3 mg/dL (ref 1.7–2.4)

## 2023-03-03 LAB — TSH: TSH: 2.358 u[IU]/mL (ref 0.350–4.500)

## 2023-03-03 LAB — HEPARIN LEVEL (UNFRACTIONATED): Heparin Unfractionated: 0.18 IU/mL — ABNORMAL LOW (ref 0.30–0.70)

## 2023-03-03 LAB — GLUCOSE, CAPILLARY: Glucose-Capillary: 100 mg/dL — ABNORMAL HIGH (ref 70–99)

## 2023-03-03 MED ORDER — AMIODARONE LOAD VIA INFUSION
150.0000 mg | Freq: Once | INTRAVENOUS | Status: AC
  Administered 2023-03-03: 150 mg via INTRAVENOUS
  Filled 2023-03-03: qty 83.34

## 2023-03-03 MED ORDER — HEPARIN BOLUS VIA INFUSION
5500.0000 [IU] | Freq: Once | INTRAVENOUS | Status: AC
  Administered 2023-03-03: 5500 [IU] via INTRAVENOUS
  Filled 2023-03-03: qty 5500

## 2023-03-03 MED ORDER — EMPAGLIFLOZIN 25 MG PO TABS
25.0000 mg | ORAL_TABLET | Freq: Every day | ORAL | Status: DC
  Administered 2023-03-04: 25 mg via ORAL
  Filled 2023-03-03: qty 1

## 2023-03-03 MED ORDER — HEPARIN (PORCINE) 25000 UT/250ML-% IV SOLN
1350.0000 [IU]/h | INTRAVENOUS | Status: DC
  Administered 2023-03-03: 1350 [IU]/h via INTRAVENOUS
  Administered 2023-03-04: 1550 [IU]/h via INTRAVENOUS
  Filled 2023-03-03 (×2): qty 250

## 2023-03-03 MED ORDER — MESALAMINE 1.2 G PO TBEC
4.8000 g | DELAYED_RELEASE_TABLET | Freq: Every day | ORAL | Status: DC
  Administered 2023-03-04: 4.8 g via ORAL
  Filled 2023-03-03 (×2): qty 4

## 2023-03-03 MED ORDER — AMIODARONE HCL IN DEXTROSE 360-4.14 MG/200ML-% IV SOLN
30.0000 mg/h | INTRAVENOUS | Status: DC
  Administered 2023-03-03 – 2023-03-04 (×2): 30 mg/h via INTRAVENOUS
  Filled 2023-03-03 (×2): qty 200

## 2023-03-03 MED ORDER — DILTIAZEM LOAD VIA INFUSION
15.0000 mg | Freq: Once | INTRAVENOUS | Status: AC
  Administered 2023-03-03: 15 mg via INTRAVENOUS
  Filled 2023-03-03: qty 15

## 2023-03-03 MED ORDER — SODIUM CHLORIDE 0.9 % IV BOLUS
1000.0000 mL | Freq: Once | INTRAVENOUS | Status: AC
  Administered 2023-03-03: 1000 mL via INTRAVENOUS

## 2023-03-03 MED ORDER — ROSUVASTATIN CALCIUM 5 MG PO TABS
10.0000 mg | ORAL_TABLET | Freq: Every day | ORAL | Status: DC
  Administered 2023-03-04: 10 mg via ORAL
  Filled 2023-03-03: qty 2

## 2023-03-03 MED ORDER — AMIODARONE HCL IN DEXTROSE 360-4.14 MG/200ML-% IV SOLN
60.0000 mg/h | INTRAVENOUS | Status: AC
  Administered 2023-03-03: 60 mg/h via INTRAVENOUS
  Filled 2023-03-03: qty 200

## 2023-03-03 MED ORDER — ONDANSETRON HCL 4 MG/2ML IJ SOLN: 4.0000 mg | Freq: Four times a day (QID) | INTRAMUSCULAR | Status: DC | PRN

## 2023-03-03 MED ORDER — ACETAMINOPHEN 325 MG PO TABS: 650.0000 mg | ORAL_TABLET | ORAL | Status: DC | PRN

## 2023-03-03 MED ORDER — DILTIAZEM HCL-DEXTROSE 125-5 MG/125ML-% IV SOLN (PREMIX)
5.0000 mg/h | INTRAVENOUS | Status: DC
  Administered 2023-03-03: 5 mg/h via INTRAVENOUS
  Filled 2023-03-03 (×2): qty 125

## 2023-03-03 NOTE — ED Triage Notes (Signed)
Pt arrives via [facility] for worsening DOE over the last two weeks. Pt also having diaphoretic episodes while exerting himself and had syncopal episode one week ago. Pt took his vitals prior to EMS and was found to have SBP in the 90's and HR 150's. EMS found pt to have BP 100/50 and HR between 110 and 150 showing aflutter. No hx of same per pt. Pt denies CP.

## 2023-03-03 NOTE — H&P (Signed)
Cardiology Admission History and Physical   Patient ID: Nathaniel Turner MRN: [id number]; DOB: [birth year]   Admission date: 03/03/2023  PCP:  Soundra Pilon, FNP   [facility] Providers Cardiologist:  Chilton Si, MD   Chief Complaint:  Atrial flutter with RVR  Patient Profile:   Nathaniel Turner is a 62 y.o. male with hx of myasthenia gravis, throat cancer, colitis and esophagitis due to yervoy, DM2, metastatic melanoma, HTN, and HLD  who is being seen 03/03/2023 for the evaluation of new onset atrial flutter with RVR.  History of Present Illness:   [name] has no prior cardiac history. He has a history of throat cancer treated with yervoy with resulted in colitis. Colitis now treated with lialda with rebound symptoms when he misses medications. He is followed by Garet Hooton for seronegative ocular myasthenia gravis. He has had an EMG consistent with a diagnosis with myasthenia gravis now tapered off of prednisone. Treated with methotrexate and cellcept.   He was seen by GI 07/2022 with no mention of ongoing esophagitis.  He briefly smoked in the 1980s.   He has no prior cardiac history. He presented to [facility] 03/03/23 with worsening DOE x 2 weeks with diaphoresis with exertion. He had a syncopal episode 1 week ago. He has been monitoring his HR and BP at home - this morning HR 110-150 and BP 100/50. EMS was dispatched and monitor showed atrial fllutter. He was brought to [facility].   On arrival, EKG  with atrial flutter VR 152. Hew as started on cardizem gtt and repeat EKG with atrial flutter now 103 ventriclar rate. He is heparinized. Will void BB given MG.    During my exam, he describes sitting at work 2 weeks ago. He stood up and became dizzy, lightheaded, and short of breath. He stepped out of his work trailer and fell to his knees - everything went black. He describes loss of consciousness but sounds like he woke up quickly. He then needed to have a BM. He went home and felt  better. Last week, he was walking up an incline and suddenly became short of breath, diaphoretic, and dizzy. He typically has bendopnea, but he felt a different type of SOB. This resolved when he rested. This morning, he felt very tired and checked his vitals leading to the current admission.   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   He describes undergoing melanoma treatment at [facility] in ?2008. During one of the scans, a "spot" was identified on his tongue later diagnosed as cancer. He underwent throat radiation with subsequent esophagitis and dysphagia. He has trouble swallowing food, especially bread.   He also reports COVID-19 infection about 6 weeks ago treated with paxlovid, did not require hospitalization.    Past Medical History:  Diagnosis Date   Anxiety and depression    Bursitis    left shoulder   Colitis    secondary to [facility] treatment   Colitis    Colon polyp    adenomatous   Diabetic peripheral neuropathy (HCC) 03/07/2018   Esophagitis    severe-due to [facility] treatment   History of throat cancer    Hyperlipidemia    Hypertension    Internal hemorrhoids without mention of complication    Melanoma (HCC)    melanoma dx 2007;   Metastatic malignant melanoma (HCC)    T2DM (type 2 diabetes mellitus) (HCC)     Past Surgical History:  Procedure Laterality  Date   AXILLARY LYMPH NODE BIOPSY Right 06/17/2014   hx. melanoma 2 spots bx.   COLONOSCOPY     MELANOMA EXCISION  2008/2015   Lymph node removal/neck and groin   POLYPECTOMY     radiation treatment     7 weeks in 2013   SHOULDER SURGERY  10/24/2008   left shoulder     Medications Prior to Admission: Prior to Admission medications   Medication Sig Start Date End Date Taking? Authorizing Provider  folic acid (FOLVITE) 1 MG tablet Take 1 mg by mouth daily. 08/12/22   [provider]  hydrochlorothiazide (HYDRODIURIL) 12.5 MG tablet Take 12.5 mg by mouth  daily. 12/30/20   [provider]  hydrocortisone (ANUSOL-HC) 25 MG suppository Place 1 suppository (25 mg total) rectally at bedtime. Use nightly for 1 week and then every other night until prescription is done.  If significant pain and discomfort then can be used twice daily for 1 week. 11/16/22   Mansouraty, Netty Starring., MD  JARDIANCE 25 MG TABS tablet Take 25 mg by mouth daily. 07/31/22   [provider]  losartan (COZAAR) 100 MG tablet Take 100 mg by mouth daily.    [provider]  mesalamine (LIALDA) 1.2 g EC tablet Take 4 tablets (4.8 g total) by mouth daily with breakfast. 08/23/22 08/18/23  Unk Lightning, PA  metFORMIN (GLUCOPHAGE) 500 MG tablet Take 1,000 mg by mouth daily with breakfast.    [provider]  methotrexate (RHEUMATREX) 2.5 MG tablet Take 8 tablets by mouth once a week. Takes on friday 08/01/22   [provider]  rosuvastatin (CRESTOR) 10 MG tablet Take 10 mg by mouth daily.    [provider]     Allergies:    Allergies  Allergen Reactions   Codeine Itching   Oxycodone Rash    Social History:   Social History   Socioeconomic History   Marital status: Married    Spouse name: Not on file   Number of children: 1   Years of education: 14   Highest education level: Not on file  Occupational History   Occupation: Facilities manager -Herbalist: [employer]  Tobacco Use   Smoking status: Former    Types: Cigarettes    Quit date: 06/07/1979    Years since quitting: 43.7   Smokeless tobacco: Never  Vaping Use   Vaping Use: Never used  Substance and Sexual Activity   Alcohol use: No   Drug use: No   Sexual activity: Not on file  Other Topics Concern   Not on file  Social History Narrative   Lives w/ spouse   Caffeine use: Sugar free energy drink   Left handed    Social Determinants of Health   Financial Resource Strain: Not on file  Food Insecurity: Not on file  Transportation  Needs: Not on file  Physical Activity: Not on file  Stress: Not on file  Social Connections: Not on file  Intimate Partner Violence: Not on file    Family History:   The patient's family history includes Cataracts in his mother; Coronary artery disease in an other family member; Diabetes in an other family member; Heart disease in his maternal grandfather and maternal grandmother; Suicidality in his father. There is no history of Colon cancer, Esophageal cancer, Stomach cancer, or Rectal cancer.    ROS:  Please see the history of present illness.  All other ROS reviewed and negative.  Physical Exam/Data:   Vitals:   03/03/23 1104 03/03/23 1130 03/03/23 1210 03/03/23 1304  BP: 98/65 105/64 92/67   Pulse: 61 72 (!) 120   Resp: 12 (!) 22 (!) 22   Temp:    97.6 F (36.4 C)  TempSrc:    Oral  SpO2: 94% 97% 95%   Weight:      Height:       No intake or output data in the 24 hours ending 03/03/23 1324    03/03/2023    9:39 AM 11/09/2022    7:10 AM 08/23/2022    1:20 PM  Last 3 Weights  Weight (lbs) 200 lb 198 lb 198 lb  Weight (kg) 90.719 kg 89.812 kg 89.812 kg     Body mass index is 27.12 kg/m.  General:  NAD HEENT: normal Neck: no JVD Vascular: No carotid bruits; Distal pulses 2+ bilaterally   Cardiac:  irregular rhythm and tachycardic rate Lungs:  clear to auscultation bilaterally, no wheezing, rhonchi or rales  Abd: soft, nontender, no hepatomegaly  Ext: no edema Musculoskeletal:  No deformities, BUE and BLE strength normal and equal Skin: warm and dry  Neuro:  CNs 2-12 intact, no focal abnormalities noted Psych:  Normal affect    EKG:  The ECG that was done was personally reviewed and demonstrates atrial flutter with VR 105  Relevant CV Studies:  Echo pending  Laboratory Data:  High Sensitivity Troponin:   Recent Labs  Lab 03/03/23 1028  TROPONINIHS 14      Chemistry Recent Labs  Lab 03/03/23 0952 03/03/23 1028  NA 142  --   K 3.9  --   CL 107   --   CO2 25  --   GLUCOSE 100*  --   BUN 17  --   CREATININE 0.86  --   CALCIUM 9.1  --   MG  --  2.3  GFRNONAA >60  --   ANIONGAP 10  --     No results for input(s): "PROT", "ALBUMIN", "AST", "ALT", "ALKPHOS", "BILITOT" in the last 168 hours. Lipids No results for input(s): "CHOL", "TRIG", "HDL", "LABVLDL", "LDLCALC", "CHOLHDL" in the last 168 hours. Hematology Recent Labs  Lab 03/03/23 0952  WBC 9.4  RBC 5.26  HGB 16.1  HCT 48.8  MCV 92.8  MCH 30.6  MCHC 33.0  RDW 13.4  PLT 254   Thyroid  Recent Labs  Lab 03/03/23 0940  TSH 2.358   BNP Recent Labs  Lab 03/03/23 0941  BNP 403.1*    DDimer No results for input(s): "DDIMER" in the last 168 hours.   Radiology/Studies:  DG Chest Port 1 View  Result Date: 03/03/2023 CLINICAL DATA:  Shortness of breath EXAM: PORTABLE CHEST 1 VIEW COMPARISON:  Chest radiograph dated 10/09/2017 FINDINGS: Left lateral ribs are not included within the field of view. Peripheral left lower lung patchy opacities. No focal consolidations. No pleural effusion or pneumothorax. Enlarged cardiomediastinal silhouette is likely projectional. No acute osseous abnormality. IMPRESSION: 1. Peripheral left lower lung patchy opacities, which may reflect atelectasis, aspiration, or pneumonia. 2. Enlarged cardiomediastinal silhouette is likely projectional. Electronically Signed   By: Agustin Cree M.D.   On: 03/03/2023 10:24     Assessment and Plan:   Atrial flutter with RVR - better rate control with cardizem gtt in the 120-140s, but now with borderline BP - echo pending  - unable to increase cardizem gtt currently running at 5 mg/hr due to SBP in the 90s - can consider TEE-DCCV  on Monday; however, will need to discuss with the reader since he has a significant history of neck radiation - likely will not be able to have TEE - given his borderline BP, will likely need to start amiodarone to hopefully convert him to sinus rhythm - avoiding BB given MG - he  did have COVID about 6 weeks ago and took paxlovid - unclear if this was contributory - may ultimately be a good candidate for ablation with EP   Shortness of breath  - BNP 403 - HS troponin 14 - Mg 2.3, K 3.9 - echo pending - no longer short of breath   Need for anticoagulation Started on heparin This patients CHA2DS2-VASc Score and unadjusted Ischemic Stroke Rate (% per year) is equal to 2.2 % stroke rate/year from a score of 2 (HTN, DM) If no procedures planned, can consider transitioning to eliquis   Hypertension PTA: 12.5 mg HCTZ, 100 mg losartan - hold these for rate control as above - avoid BB   Hyperlipidemia Update lipid panel Continue 10 mg crestor    DM2 On metformin and 25 mg jardiance   Thyroid disease TSH 2.358   Risk Assessment/Risk Scores:     Severity of Illness: The appropriate patient status for this patient is INPATIENT. Inpatient status is judged to be reasonable and necessary in order to provide the required intensity of service to ensure the patient's safety. The patient's presenting symptoms, physical exam findings, and initial radiographic and laboratory data in the context of their chronic comorbidities is felt to place them at high risk for further clinical deterioration. Furthermore, it is not anticipated that the patient will be medically stable for discharge from the hospital within 2 midnights of admission.   * I certify that at the point of admission it is my clinical judgment that the patient will require inpatient hospital care spanning beyond 2 midnights from the point of admission due to high intensity of service, high risk for further deterioration and high frequency of surveillance required.*   For questions or updates, please contact [facility] Please consult [email or web address] for contact info under     Signed, Marcelino Duster, PA  03/03/2023 1:24 PM

## 2023-03-03 NOTE — Progress Notes (Signed)
ANTICOAGULATION CONSULT NOTE Pharmacy Consult for Heparin infusion Indication: atrial fibrillation/atrial flutter  Allergies  Allergen Reactions   Codeine Itching   Oxycodone Rash    Patient Measurements: Height: 6' (182.9 cm) Weight: 90.7 kg (200 lb) IBW/kg (Calculated) : 77.6 Heparin Dosing Weight: 90.8 kg  Vital Signs: Temp: 97.7 F (36.5 C) (05/10 1857) Temp Source: Oral (05/10 1857) BP: 136/65 (05/10 1857) Pulse Rate: 99 (05/10 1830)  Labs: Recent Labs    03/03/23 0952 03/03/23 1028 03/03/23 1241 03/03/23 1937  HGB 16.1  --   --   --   HCT 48.8  --   --   --   PLT 254  --   --   --   HEPARINUNFRC  --   --   --  0.18*  CREATININE 0.86  --   --   --   TROPONINIHS  --  14 14  --      Estimated Creatinine Clearance: 97.8 mL/min (by C-G formula based on SCr of 0.86 mg/dL).  Assessment: 62 yo M BIB EMS with worsening DOE over the past 2 weeks. HR in 150s, SBP 90s. Found to be in Aflutter and Afib w/ RVR. Patient was not taking anticoagulation prior to admission. Pharmacy consulted to initiate a heparin infusion for new-onset Afib/Aflutter.   Hgb 16.1, Plt 254 - stable No s/sx of bleeding  Initial heparin level 0.18  Goal of Therapy:  Heparin level 0.3-0.7 units/ml Monitor platelets by anticoagulation protocol: Yes   Plan:  Increase heparin to 1550 units / hr Heparin level in AM   Thank you Okey Regal, PharmD 03/03/2023 8:53 PM

## 2023-03-03 NOTE — ED Provider Notes (Signed)
[place] EMERGENCY DEPARTMENT AT [facility] Provider Note   CSN: [id number] Arrival date & time: 03/03/23  7829     History  Chief Complaint  Patient presents with   Shortness of Breath    Nathaniel Turner is a 62 y.o. male.  The history is provided by the patient, a relative and medical records. No language interpreter was used.  Shortness of Breath    62 year old male with significant history of diabetes, hypertension, hyperlipidemia, prior history of skin and throat cancer brought here via EMS from home for evaluation of shortness of breath.  For the past 2 weeks patient has intermittent episodes of shortness of breath with exertion.  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  Yesterday he endorse fatigue, nausea, and can't catch his breath.  Today he checked his vital sign in the morning and notice that his heart rate was high and blood pressure was low.  When EMS arrived, they noted that his blood pressure was in the 90s systolic with a heart rate of 562Z.  Initial EKG shows signs of atrial flutter.  Patient does not have any history of atrial flutter or atrial fibrillation.  He does not endorse any fever, chills, chest pain, abdominal pain, back pain, nausea or vomiting.  Denies any cold symptoms.  No history of thyroid disease.  Denies any change in his diet no increased energy drinks.  No prior history of PE or DVT.  Home Medications Prior to Admission medications   Medication Sig Start Date End Date Taking? Authorizing Provider  folic acid (FOLVITE) 1 MG tablet Take 1 mg by mouth daily. 08/12/22   [provider]  hydrochlorothiazide (HYDRODIURIL) 12.5 MG tablet Take 12.5 mg by mouth daily. 12/30/20   [provider]  hydrocortisone (ANUSOL-HC) 25 MG suppository Place 1 suppository (25 mg total) rectally at bedtime. Use nightly for 1 week and then every other night until  prescription is done.  If significant pain and discomfort then can be used twice daily for 1 week. 11/16/22   Mansouraty, Netty Starring., MD  JARDIANCE 25 MG TABS tablet Take 25 mg by mouth daily. 07/31/22   [provider]  losartan (COZAAR) 100 MG tablet Take 100 mg by mouth daily.    [provider]  mesalamine (LIALDA) 1.2 g EC tablet Take 4 tablets (4.8 g total) by mouth daily with breakfast. 08/23/22 08/18/23  Unk Lightning, PA  metFORMIN (GLUCOPHAGE) 500 MG tablet Take 1,000 mg by mouth daily with breakfast.    [provider]  methotrexate (RHEUMATREX) 2.5 MG tablet Take 8 tablets by mouth once a week. Takes on friday 08/01/22   [provider]  rosuvastatin (CRESTOR) 10 MG tablet Take 10 mg by mouth daily.    [provider]      Allergies    Codeine and Oxycodone    Review of Systems   Review of Systems  Respiratory:  Positive for shortness of breath.   All other systems reviewed and are negative.   Physical Exam Updated Vital Signs BP (!) 123/102   Pulse (!) 152   Temp 98 F (36.7 C) (Oral)   Resp (!) 21   Ht 6' (1.829 m)   Wt 90.7 kg   SpO2 100%   BMI 27.12 kg/m  Physical Exam Vitals and nursing note reviewed.  Constitutional:      General: He is  not in acute distress.    Appearance: He is well-developed.  HENT:     Head: Atraumatic.     Mouth/Throat:     Mouth: Mucous membranes are moist.  Eyes:     Conjunctiva/sclera: Conjunctivae normal.  Cardiovascular:     Rate and Rhythm: Tachycardia present.  Pulmonary:     Effort: Pulmonary effort is normal.     Breath sounds: Normal breath sounds. No decreased breath sounds, wheezing, rhonchi or rales.  Chest:     Chest wall: No tenderness.  Musculoskeletal:     Cervical back: Normal range of motion and neck supple.     Right lower leg: No edema.     Left lower leg: No edema.  Skin:    Findings: No rash.  Neurological:     Mental Status: He is alert and  oriented to person, place, and time.  Psychiatric:        Mood and Affect: Mood normal.     ED Results / Procedures / Treatments   Labs (all labs ordered are listed, but only abnormal results are displayed) Labs Reviewed  BASIC METABOLIC PANEL - Abnormal; Notable for the following components:      Result Value   Glucose, Bld 100 (*)    All other components within normal limits  BRAIN NATRIURETIC PEPTIDE - Abnormal; Notable for the following components:   B Natriuretic Peptide 403.1 (*)    All other components within normal limits  CBC WITH DIFFERENTIAL/PLATELET  TSH  MAGNESIUM  HEPARIN LEVEL (UNFRACTIONATED)  CBG MONITORING, ED  TROPONIN I (HIGH SENSITIVITY)  TROPONIN I (HIGH SENSITIVITY)    EKG EKG Interpretation  Date/Time:  Friday Mar 03 2023 09:41:32 EDT Ventricular Rate:  152 PR Interval:    QRS Duration: 104 QT Interval:  275 QTC Calculation: 438 R Axis:   95 Text Interpretation: Atrial flutter Right axis deviation  atrial flutter new since 2022 Confirmed by Pricilla Loveless [phone]) on 03/03/2023 9:51:12 AM  Radiology No results found.  Procedures .Critical Care  Performed by: Fayrene Helper, PA-C Authorized by: Fayrene Helper, PA-C   Critical care provider statement:    Critical care time (minutes):  45   Critical care was time spent personally by me on the following activities:  Development of treatment plan with patient or surrogate, discussions with consultants, evaluation of patient's response to treatment, examination of patient, ordering and review of laboratory studies, ordering and review of radiographic studies, ordering and performing treatments and interventions, pulse oximetry, re-evaluation of patient's condition and review of old charts     Medications Ordered in ED Medications  diltiazem (CARDIZEM) 1 mg/mL load via infusion 15 mg (15 mg Intravenous Bolus from Bag 03/03/23 1003)    And  diltiazem (CARDIZEM) 125 mg in dextrose 5% 125 mL (1 mg/mL)  infusion (5 mg/hr Intravenous Restarted 03/03/23 1130)  heparin ADULT infusion 100 units/mL (25000 units/281mL) (1,350 Units/hr Intravenous New Bag/Given 03/03/23 1245)  sodium chloride 0.9 % bolus 1,000 mL (0 mLs Intravenous Stopped 03/03/23 1129)  heparin bolus via infusion 5,500 Units (5,500 Units Intravenous Bolus from Bag 03/03/23 1245)    ED Course/ Medical Decision Making/ A&P                             Medical Decision Making Amount and/or Complexity of Data Reviewed Labs: ordered. Radiology: ordered.  Risk Prescription drug management. Decision regarding hospitalization.   BP (!) 123/102   Pulse (!) 152  Temp 98 F (36.7 C) (Oral)   Resp (!) 21   Ht 6' (1.829 m)   Wt 90.7 kg   SpO2 100%   BMI 27.12 kg/m   51:25 AM   63 year old male with significant history of diabetes, hypertension, hyperlipidemia, prior history of skin and throat cancer brought here via EMS from home for evaluation of shortness of breath.  For the past 2 weeks patient has intermittent episodes of shortness of breath with exertion.  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  Yesterday he endorse fatigue, nausea, and can't catch his breath.  Today he checked his vital sign in the morning and notice that his heart rate was high and blood pressure was low.  When EMS arrived, they noted that his blood pressure was in the 90s systolic with a heart rate of 829F.  Initial EKG shows signs of atrial flutter.  Patient does not have any history of atrial flutter or atrial fibrillation.  He does not endorse any fever, chills, chest pain, abdominal pain, back pain, nausea or vomiting.  Denies any cold symptoms.  No history of thyroid disease.  Denies any change in his diet no increased energy drinks.  No prior history of PE or DVT.  On exam, patient is lying in bed appears to be in no acute discomfort.  Is able to speak in complete sentences.   Heart with extreme tachycardia but seems to be regular rhythm.  Lungs are clear to auscultation bilaterally abdomen is soft nontender no peripheral edema.  Vital signs notable for document heart rate of 152, no fever no hypoxia.  Current blood pressure is 123/102.  Patient has either SVT versus atrial flutter.  I have attempted the vagal maneuver with minimal improvement of his heart rate.  Workup initiated, will give diltiazem bolus and drip  Suspect symptomatic arrhythmia. Care discussed with Dr. Criss Alvine.  Due to no clear timeline of patient's heart palpitation he is not a candidate for cardioversion.  10:17 AM Patient was started on Cardizem bolus and drip.  Heart rate steadily improved with initial treatment however blood pressure did drops to 105 systolic.  Will pause Cardizem drip and continue with IV fluid hydration and will monitor closely.  Patient is mentating appropriately.  11:46 AM Cardizem drip resumed, blood pressure did drop down to 80 systolic.  Still tachycardic with heart rate in the 130s range.  I patient consultation from on-call cardiology and spoke with Dr. Duke Salvia who recommend initiating heparin and cardiology team will evaluate patient.  Anticipate hospital admission.  -Labs ordered, independently viewed and interpreted by me.  Labs remarkable for BNP 403. Normal TSH, normal eleectrolytes -The patient was maintained on a cardiac monitor.  I personally viewed and interpreted the cardiac monitored which showed an underlying rhythm of: atrial flutter -Imaging independently viewed and interpreted by me and I agree with radiologist's interpretation.  Result remarkable for CXR showing atelectasis vs pna. Pt without sxs to suggest pneumonia -This patient presents to the ED for concern of weakness, this involves an extensive number of treatment options, and is a complaint that carries with it a high risk of complications and morbidity.  The differential diagnosis includes  symptomatic afib/flutter, cardiac arrhythmia, acs, electrolytes derangement, infection -Co morbidities that complicate the patient evaluation includes CA, DM, HTN -Treatment includes heparin, cardizem -Reevaluation of the patient after these medicines showed that the patient improved -PCP office  notes or outside notes reviewed -Discussion with specialist cardiology -Escalation to admission/observation considered: patient is agreeable with admission.         Final Clinical Impression(s) / ED Diagnoses Final diagnoses:  Atrial flutter, unspecified type (HCC)  Hypotension, unspecified hypotension type    Rx / DC Orders ED Discharge Orders     None         Fayrene Helper, PA-C 03/03/23 1430    Pricilla Loveless, MD 03/06/23 [phone]

## 2023-03-03 NOTE — ED Notes (Signed)
Spoke with Selena Batten in lab, Krebs added on

## 2023-03-03 NOTE — Progress Notes (Signed)
ANTICOAGULATION CONSULT NOTE - Initial Consult  Pharmacy Consult for Heparin infusion Indication: atrial fibrillation/atrial flutter  Allergies  Allergen Reactions   Codeine Itching   Oxycodone Rash    Patient Measurements: Height: 6' (182.9 cm) Weight: 90.7 kg (200 lb) IBW/kg (Calculated) : 77.6 Heparin Dosing Weight: 90.8 kg  Vital Signs: Temp: 98 F (36.7 C) (05/10 0943) Temp Source: Oral (05/10 0943) BP: 92/67 (05/10 1210) Pulse Rate: 120 (05/10 1210)  Labs: Recent Labs    03/03/23 0952 03/03/23 1028  HGB 16.1  --   HCT 48.8  --   PLT 254  --   CREATININE 0.86  --   TROPONINIHS  --  14    Estimated Creatinine Clearance: 97.8 mL/min (by C-G formula based on SCr of 0.86 mg/dL).   Medical History: Past Medical History:  Diagnosis Date   Anxiety and depression    Bursitis    left shoulder   Colitis    secondary to [facility] treatment   Colitis    Colon polyp    adenomatous   Diabetic peripheral neuropathy (HCC) 03/07/2018   Esophagitis    severe-due to [facility] treatment   History of throat cancer    Hyperlipidemia    Hypertension    Internal hemorrhoids without mention of complication    Melanoma (HCC)    melanoma dx 2007;   Metastatic malignant melanoma (HCC)    T2DM (type 2 diabetes mellitus) (HCC)     Medications:  (Not in a hospital admission)   Assessment: 62 yo M BIB EMS with worsening DOE over the past 2 weeks. HR in 150s, SBP 90s. Found to be in Aflutter and Afib w/ RVR. Patient was not taking anticoagulation prior to admission. Pharmacy consulted to initiate a heparin infusion for new-onset Afib/Aflutter.   Hgb 16.1, Plt 254 - stable No s/sx of bleeding  Goal of Therapy:  Heparin level 0.3-0.7 units/ml Monitor platelets by anticoagulation protocol: Yes   Plan:  Give 5500 units IV heparin bolus, then Initiate heparin infusion at 1350 units/hr  Check heparin level in 6 hours Monitor daily CBC, heparin level, and for s/sx of  bleeding   Wilburn Cornelia, PharmD, BCPS Clinical Pharmacist 03/03/2023 12:35 PM   Please refer to [facility] for pharmacy phone number

## 2023-03-04 ENCOUNTER — Other Ambulatory Visit (HOSPITAL_COMMUNITY): Payer: Managed Care, Other (non HMO)

## 2023-03-04 ENCOUNTER — Other Ambulatory Visit: Payer: Self-pay | Admitting: Physician Assistant

## 2023-03-04 DIAGNOSIS — I10 Essential (primary) hypertension: Secondary | ICD-10-CM | POA: Insufficient documentation

## 2023-03-04 DIAGNOSIS — I4892 Unspecified atrial flutter: Secondary | ICD-10-CM

## 2023-03-04 DIAGNOSIS — E785 Hyperlipidemia, unspecified: Secondary | ICD-10-CM | POA: Insufficient documentation

## 2023-03-04 DIAGNOSIS — R55 Syncope and collapse: Secondary | ICD-10-CM | POA: Insufficient documentation

## 2023-03-04 LAB — LIPID PANEL
Cholesterol: 122 mg/dL (ref 0–200)
HDL: 34 mg/dL — ABNORMAL LOW (ref >40)
LDL Cholesterol: 73 mg/dL (ref 0–99)
Total CHOL/HDL Ratio: 3.6 RATIO
Triglycerides: 77 mg/dL (ref <150)
VLDL: 15 mg/dL (ref 0–40)

## 2023-03-04 LAB — CBC
HCT: 44.7 % (ref 39.0–52.0)
Hemoglobin: 14.6 g/dL (ref 13.0–17.0)
MCH: 30.2 pg (ref 26.0–34.0)
MCHC: 32.7 g/dL (ref 30.0–36.0)
MCV: 92.5 fL (ref 80.0–100.0)
Platelets: 206 10*3/uL (ref 150–400)
RBC: 4.83 MIL/uL (ref 4.22–5.81)
RDW: 13.6 % (ref 11.5–15.5)
WBC: 7 10*3/uL (ref 4.0–10.5)
nRBC: 0 % (ref 0.0–0.2)

## 2023-03-04 LAB — HEPARIN LEVEL (UNFRACTIONATED)
Heparin Unfractionated: 0.98 IU/mL — ABNORMAL HIGH (ref 0.30–0.70)
Heparin Unfractionated: 0.5 IU/mL (ref 0.30–0.70)

## 2023-03-04 LAB — BASIC METABOLIC PANEL
Anion gap: 10 (ref 5–15)
BUN: 15 mg/dL (ref 8–23)
CO2: 24 mmol/L (ref 22–32)
Calcium: 8.5 mg/dL — ABNORMAL LOW (ref 8.9–10.3)
Chloride: 106 mmol/L (ref 98–111)
Creatinine, Ser: 0.91 mg/dL (ref 0.61–1.24)
GFR, Estimated: >60 mL/min (ref >60)
Glucose, Bld: 144 mg/dL — ABNORMAL HIGH (ref 70–99)
Potassium: 4.1 mmol/L (ref 3.5–5.1)
Sodium: 140 mmol/L (ref 135–145)

## 2023-03-04 LAB — GLUCOSE, CAPILLARY: Glucose-Capillary: 152 mg/dL — ABNORMAL HIGH (ref 70–99)

## 2023-03-04 MED ORDER — AMIODARONE HCL 200 MG PO TABS: ORAL_TABLET | ORAL | 0 refills | Status: DC

## 2023-03-04 MED ORDER — AMIODARONE HCL 200 MG PO TABS
400.0000 mg | ORAL_TABLET | Freq: Two times a day (BID) | ORAL | Status: DC
  Administered 2023-03-04: 400 mg via ORAL
  Filled 2023-03-04: qty 2

## 2023-03-04 MED ORDER — APIXABAN 5 MG PO TABS: 5.0000 mg | ORAL_TABLET | Freq: Two times a day (BID) | ORAL | 5 refills | Status: DC

## 2023-03-04 MED ORDER — APIXABAN 5 MG PO TABS
5.0000 mg | ORAL_TABLET | Freq: Once | ORAL | Status: AC
  Administered 2023-03-04: 5 mg via ORAL
  Filled 2023-03-04: qty 1

## 2023-03-04 MED ORDER — AMIODARONE HCL 200 MG PO TABS: 200.0000 mg | ORAL_TABLET | Freq: Every day | ORAL | Status: DC

## 2023-03-04 NOTE — Progress Notes (Signed)
Rounding Note    Patient Name: Nathaniel Turner Date of Encounter: 03/04/2023   [facility] Cardiologist: Chilton Si, MD   Subjective   Feeling well, no issues  Inpatient Medications    Scheduled Meds:  empagliflozin  25 mg Oral Daily   mesalamine  4.8 g Oral Q breakfast   rosuvastatin  10 mg Oral Daily   Continuous Infusions:  amiodarone 30 mg/hr (03/04/23 0954)   diltiazem (CARDIZEM) infusion 5 mg/hr (03/04/23 0400)   heparin 1,350 Units/hr (03/04/23 0736)   PRN Meds: acetaminophen, ondansetron (ZOFRAN) IV   Vital Signs    Vitals:   03/04/23 0612 03/04/23 0800 03/04/23 1042 03/04/23 1129  BP:  116/72 131/73 (!) 140/78  Pulse:  89 79 79  Resp:  18  18  Temp:  97.8 F (36.6 C)  98.3 F (36.8 C)  TempSrc:  Oral  Oral  SpO2:  96% 95% 97%  Weight: 90 kg     Height:        Intake/Output Summary (Last 24 hours) at 03/04/2023 1234 Last data filed at 03/04/2023 0400 Gross per 24 hour  Intake 803.72 ml  Output --  Net 803.72 ml      03/04/2023    6:12 AM 03/03/2023    9:39 AM 11/09/2022    7:10 AM  Last 3 Weights  Weight (lbs) 198 lb 8 oz 200 lb 198 lb  Weight (kg) 90.039 kg 90.719 kg 89.812 kg      Telemetry    NSR - Personally Reviewed  ECG    No new - Personally Reviewed  Physical Exam   Vitals:   03/04/23 1042 03/04/23 1129  BP: 131/73 (!) 140/78  Pulse: 79 79  Resp:  18  Temp:  98.3 F (36.8 C)  SpO2: 95% 97%    GEN: No acute distress.   Neck: No JVD Cardiac: RRR, no murmurs, rubs, or gallops.  Respiratory: Clear to auscultation bilaterally. GI: Soft, nontender, non-distended  MS: No edema; No deformity. Neuro:  Nonfocal  Psych: Normal affect   Labs    High Sensitivity Troponin:   Recent Labs  Lab 03/03/23 1028 03/03/23 1241  TROPONINIHS 14 14     Chemistry Recent Labs  Lab 03/03/23 0952 03/03/23 1028 03/04/23 0557  NA 142  --  140  K 3.9  --  4.1  CL 107  --  106  CO2 25  --  24  GLUCOSE 100*  --   144*  BUN 17  --  15  CREATININE 0.86  --  0.91  CALCIUM 9.1  --  8.5*  MG  --  2.3  --   GFRNONAA >60  --  >60  ANIONGAP 10  --  10    Lipids  Recent Labs  Lab 03/04/23 0557  CHOL 122  TRIG 77  HDL 34*  LDLCALC 73  CHOLHDL 3.6    Hematology Recent Labs  Lab 03/03/23 0952 03/04/23 0557  WBC 9.4 7.0  RBC 5.26 4.83  HGB 16.1 14.6  HCT 48.8 44.7  MCV 92.8 92.5  MCH 30.6 30.2  MCHC 33.0 32.7  RDW 13.4 13.6  PLT 254 206   Thyroid  Recent Labs  Lab 03/03/23 0940  TSH 2.358    BNP Recent Labs  Lab 03/03/23 0941  BNP 403.1*    DDimer No results for input(s): "DDIMER" in the last 168 hours.   Radiology    DG Chest Port 1 View  Result Date: 03/03/2023 CLINICAL DATA:  Shortness of breath EXAM: PORTABLE CHEST 1 VIEW COMPARISON:  Chest radiograph dated 10/09/2017 FINDINGS: Left lateral ribs are not included within the field of view. Peripheral left lower lung patchy opacities. No focal consolidations. No pleural effusion or pneumothorax. Enlarged cardiomediastinal silhouette is likely projectional. No acute osseous abnormality. IMPRESSION: 1. Peripheral left lower lung patchy opacities, which may reflect atelectasis, aspiration, or pneumonia. 2. Enlarged cardiomediastinal silhouette is likely projectional. Electronically Signed   By: Agustin Cree M.D.   On: 03/03/2023 10:24    Cardiac Studies     Patient Profile     Mr. Brum is a 51M with Myasthenia gravis, throat cancer s/p XRT, DM, HTN and HL admitted with new onset atrial flutter.   Assessment & Plan    # Atrial flutter:  -s/p IV dilt and amio - new onset -Now resolved.  - start oral amiodarone 400 mg BID for 10 days total, then 200 mg daily thereafter - continue heparin for now  # Acute heart failure, type unknown:  -based on BNP? Elevated 403 - euvolemic, s/p gentle diuresis -Continue Jardiance. Echocardiogram is pending, scheduled for 3 PM   # Hypertension:  Continue home losartan and HCTZ on  discharge.  Avoid beta-blockers in the setting of myasthenia gravis.   # Hyperlipidemia: Continue rosuvastatin  If his echo shows normal function and no WMA, can discharge to home on eliquis 5 mg BID.   For questions or updates, please contact [facility] Please consult [email or web address] for contact info under        Signed, Maisie Fus, MD  03/04/2023, 12:34 PM

## 2023-03-04 NOTE — Discharge Instructions (Signed)

## 2023-03-04 NOTE — Progress Notes (Signed)
Patient converted to sinus rhythm at 0023 Cardizem turned off per advisement from Dr. Brayton Layman.

## 2023-03-04 NOTE — Discharge Summary (Signed)
Discharge Summary    Patient ID: Nathaniel Turner MRN: [id number]; DOB: [birth year]  Admit date: 03/03/2023 Discharge date: 03/04/2023  PCP:  Soundra Pilon, FNP   [facility] Providers Cardiologist:  Chilton Si, MD     Discharge Diagnoses    Principal Problem:   Atrial flutter [facility]) Active Problems:   Elevated brain natriuretic peptide (BNP) level   Pure hypercholesterolemia   HTN (hypertension)   Hyperlipidemia   Syncope and collapse    Diagnostic Studies/Procedures    Echocardiogram to be done as outpatient _____________   History of Present Illness     Nathaniel Turner is a 62 y.o. male with a past medical history of myasthenia gravis, throat cancer, colitis and esophagitis due to yervoy, DM2, metastatic melanoma, HTN, and HLD.  He has no prior cardiac history. He has a history of throat cancer treated with yervoy with resulted in colitis. Colitis now treated with lialda with rebound symptoms when he misses medications. He is followed by [facility] for seronegative ocular myasthenia gravis. He has had an EMG consistent with a diagnosis with myasthenia gravis now tapered off of prednisone. Treated with methotrexate and cellcept. He was seen by GI 07/2022 with no mention of ongoing esophagitis.  He describes undergoing melanoma treatment at [facility] in ?2008. During one of the scans, a "spot" was identified on his tongue later diagnosed as cancer. He underwent throat radiation with subsequent esophagitis and dysphagia. He has trouble swallowing food, especially bread. He briefly smoked in the 1980s.  He also reports COVID-19 infection about 6 weeks ago treated with paxlovid, did not require hospitalization. 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 He has no prior cardiac history. He presented to [facility] 03/03/23 with worsening DOE x 2 weeks with diaphoresis with exertion. He had a syncopal episode about 1 week ago. He stood up and became dizzy, lightheaded,  and short of breath. He stepped out of his work trailer and fell to his knees - everything went black. He describes loss of consciousness but sounds like he woke up quickly. He then needed to have a BM. He went home and felt better. Last week, he was walking up an incline and suddenly became short of breath, diaphoretic, and dizzy. He typically has bendopnea, but he felt a different type of SOB. This resolved when he rested. On day of admission, he felt very tired and checked his vitals leading to the current admission. Yesterday HR was 110-150 and BP 100/50. EMS was dispatched and monitor showed atrial fllutter. He was brought to [facility]. On arrival, EKG  with atrial flutter VR 152. Hew as started on cardizem gtt, IV heparin and admitted to the cardiology service. CXR shoewd  peripheral left lower lung patchy opacities, which may reflect atelectasis, aspiration, or pneumonia - he was not felt to have any infection issues going on by admitting team.   Hospital Course     New onset Atrial Flutter  - Patient presented to the ED on 5/10 complaining of worsening DOE for the past two weeks. He had been checking his BP and HR at home, and noticed that his HR ranged from 110-150 at home on 5/10. He called EMS and monitor showed atrial flutter. In the ED, EKG showed atrial flutter with HR 152 BPM. TSH was normal in the ED  - In the ED, patient was started on IV heparin. He was also started on IV Cardizem which helped improve HR. However, while IV Cardizem  was running at 5 mg/hr, his SBP was in the 90s which prevented increasing Cardizem.  - Patient was started on IV amiodarone, and converted to NSR around 0023 on 5/11. IV Cardizem was stopped  - Patient was transitioned to PO amiodarone 400 mg BID on 5/11. Plan to continue amiodarone 400 mg BID for 10 days, then decrease dose to 200 mg daily  - Avoid BB given history of myasthenia gravis  - Started eliquis 5 mg BID prior to DC  - Echocardiogram to be done as  outpatient per Dr. Wyline Mood given delay in weekend echocardiograms; she felt patient was stable for discharge - Patient has a follow up with General Cardiology on 5/24 - Further monitoring of organ systems while on amiodarone will be at discretion of outpatient team - TSH, recent LFTs wnl  Elevated BNP - LVEF not known at this time, possible mild acute heart failure - Patient presented to the ED with BNP elevated to 403, possibly driven by RVR - did not require diuresis - patient euvolemic on exam  - to continue Jardiance - outpatient echo planned  HTN  - BP was soft when patient was on IV Cardizem, but was overall within normal limits after IV cardizem was discontinued  - Continue home losartan and HCTZ   HLD  - Lipid panel this admission showed LDL 73, HDL 34, triglycerides 77, total cholesterol 122  - Continue crestor 10 mg daily   Syncope  - Patient reported having a syncopal episode about 1 week prior to his admission. He had stood up and developed dizziness, lightheadness, and shortness of breath. He "feel to his knees" and everything went black. Woke up quickly  - Echocardiogram to be done as outpatient per Dr. Wyline Mood, she did not feel any specific driving restrictions needed given nature of event and stability of telemetry after conversion to NSR - Consider cardiac monitor at follow up appointment if he has any recurrent symptoms  Dr. Wyline Mood has seen and examined the patient today and feels he is stable for discharge. Message sent to office to arrange outpatient echo. Has f/u scheduled 03/17/23.  Did the patient have an acute coronary syndrome (MI, NSTEMI, STEMI, etc) this admission?:  No                               Did the patient have a percutaneous coronary intervention (stent / angioplasty)?:  No.       _____________  Discharge Vitals Blood pressure (!) 140/78, pulse 79, temperature 98.3 F (36.8 C), temperature source Oral, resp. rate 18, height 6' (1.829 m), weight 90  kg, SpO2 97 %.  Filed Weights   03/03/23 0939 03/04/23 0612  Weight: 90.7 kg 90 kg    Labs & Radiologic Studies    CBC Recent Labs    03/03/23 0952 03/04/23 0557  WBC 9.4 7.0  NEUTROABS 6.9  --   HGB 16.1 14.6  HCT 48.8 44.7  MCV 92.8 92.5  PLT 254 206   Basic Metabolic Panel Recent Labs    16/10/96 0952 03/03/23 1028 03/04/23 0557  NA 142  --  140  K 3.9  --  4.1  CL 107  --  106  CO2 25  --  24  GLUCOSE 100*  --  144*  BUN 17  --  15  CREATININE 0.86  --  0.91  CALCIUM 9.1  --  8.5*  MG  --  2.3  --  Liver Function Tests No results for input(s): "AST", "ALT", "ALKPHOS", "BILITOT", "PROT", "ALBUMIN" in the last 72 hours. No results for input(s): "LIPASE", "AMYLASE" in the last 72 hours. High Sensitivity Troponin:   Recent Labs  Lab 03/03/23 1028 03/03/23 1241  TROPONINIHS 14 14    BNP Invalid input(s): "POCBNP" D-Dimer No results for input(s): "DDIMER" in the last 72 hours. Hemoglobin A1C No results for input(s): "HGBA1C" in the last 72 hours. Fasting Lipid Panel Recent Labs    03/04/23 0557  CHOL 122  HDL 34*  LDLCALC 73  TRIG 77  CHOLHDL 3.6   Thyroid Function Tests Recent Labs    03/03/23 0940  TSH 2.358   _____________  DG Chest Port 1 View  Result Date: 03/03/2023 CLINICAL DATA:  Shortness of breath EXAM: PORTABLE CHEST 1 VIEW COMPARISON:  Chest radiograph dated 10/09/2017 FINDINGS: Left lateral ribs are not included within the field of view. Peripheral left lower lung patchy opacities. No focal consolidations. No pleural effusion or pneumothorax. Enlarged cardiomediastinal silhouette is likely projectional. No acute osseous abnormality. IMPRESSION: 1. Peripheral left lower lung patchy opacities, which may reflect atelectasis, aspiration, or pneumonia. 2. Enlarged cardiomediastinal silhouette is likely projectional. Electronically Signed   By: Agustin Cree M.D.   On: 03/03/2023 10:24   Disposition   Pt is being discharged home today in  good condition.  Follow-up Plans & Appointments     Follow-up Information     Sharlene Dory, PA-C Follow up.   Specialty: Cardiology Why: [facility] - [address] location - cardiology follow-up arranged on Friday Mar 17, 2023 8:25 AM (Arrive by 8:10 AM). The office will call you to arrange your heart ultrasound which may be at a different location so be sure to double check when they call. Contact information: [address] [place] [address] [phone]         FYI Follow up.   Why: Disregard the listed "Transesophageal echocardiogram" listed for 03/06/23 - this is a procedure we will be cancelling since you went back into normal rhythm this admission and no longer need this test.               Discharge Instructions     Diet - low sodium heart healthy   Complete by: As directed    Discharge instructions   Complete by: As directed    You were started on a new medicine called amiodarone to help with your heart rhythm.  You were also started on a new blood thinner called apixaban. If you notice any bleeding such as blood in stool, black tarry stools, blood in urine, nosebleeds or any other unusual bleeding, call your doctor immediately. It is not normal to have this kind of bleeding while on a blood thinner and usually indicates there is an underlying problem with one of your body systems that needs to be checked out.   Increase activity slowly   Complete by: As directed         Discharge Medications   Allergies as of 03/04/2023       Reactions   Codeine Itching   Oxycodone Rash        Medication List     STOP taking these medications    hydrocortisone 25 MG suppository Commonly known as: ANUSOL-HC       TAKE these medications    amiodarone 200 MG tablet Commonly known as: PACERONE Take 2 tablets (400 mg) by mouth twice a day through 03/13/23.  On 03/14/23, decrease to 1 tablet (200 mg) once a day. Start taking on: Mar 13, 2023   apixaban 5 MG Tabs tablet Commonly known as: ELIQUIS Take 1 tablet (5 mg total) by mouth 2 (two) times daily.   hydrochlorothiazide 12.5 MG tablet Commonly known as: HYDRODIURIL Take 12.5 mg by mouth daily.   Jardiance 25 MG Tabs tablet Generic drug: empagliflozin Take 25 mg by mouth daily.   losartan 100 MG tablet Commonly known as: COZAAR Take 100 mg by mouth daily.   mesalamine 1.2 g EC tablet Commonly known as: LIALDA Take 4 tablets (4.8 g total) by mouth daily with breakfast.   metFORMIN 500 MG tablet Commonly known as: GLUCOPHAGE Take 1,000 mg by mouth daily with breakfast.   Multi-Vitamin tablet Take 1 tablet by mouth daily.   rosuvastatin 10 MG tablet Commonly known as: CRESTOR Take 10 mg by mouth daily.           Outstanding Labs/Studies     Duration of Discharge Encounter   Greater than 30 minutes including physician time.  Signed, Laurann Montana, PA-C 03/04/2023, 4:41 PM

## 2023-03-04 NOTE — Progress Notes (Signed)
Discharge teachings was completed. Medications, diet, activity and follow-up appointments reviewed. Copy of instructions handed to the patient. And prescriptions sent to pharmacy. Patient discharged home with wife.

## 2023-03-04 NOTE — Care Management (Signed)
Spoke w patient over the phone an explained how to use the copay card and the 30 day free card. Tubed cards to unit, nurse aware to get them from tube station and give to patient. Patient understands he will leave with them in hand.

## 2023-03-04 NOTE — Progress Notes (Addendum)
ANTICOAGULATION CONSULT NOTE Pharmacy Consult for Heparin infusion Indication: atrial fibrillation/atrial flutter  Allergies  Allergen Reactions   Codeine Itching   Oxycodone Rash    Patient Measurements: Height: 6' (182.9 cm) Weight: 90 kg (198 lb 8 oz) IBW/kg (Calculated) : 77.6 Heparin Dosing Weight: 90.8 kg  Vital Signs: Temp: 97.7 F (36.5 C) (05/11 0350) Temp Source: Oral (05/11 0350) BP: 136/63 (05/11 0350) Pulse Rate: 89 (05/11 0350)  Labs: Recent Labs    03/03/23 0952 03/03/23 1028 03/03/23 1241 03/03/23 1937 03/04/23 0557  HGB 16.1  --   --   --  14.6  HCT 48.8  --   --   --  44.7  PLT 254  --   --   --  206  HEPARINUNFRC  --   --   --  0.18* 0.98*  CREATININE 0.86  --   --   --   --   TROPONINIHS  --  14 14  --   --      Estimated Creatinine Clearance: 97.8 mL/min (by C-G formula based on SCr of 0.86 mg/dL).  Assessment: 62 yo M BIB EMS with worsening DOE over the past 2 weeks. HR in 150s, SBP 90s. Found to be in Aflutter and Afib w/ RVR. Patient was not taking anticoagulation prior to admission. Pharmacy consulted to initiate a heparin infusion for new-onset Afib/Aflutter.   Hgb 14.6, Plt 206 - stable. No issues with infusion or bleeding noted.   Heparin level supratherapeutic at 0.98.  Goal of Therapy:  Heparin level 0.3-0.7 units/ml Monitor platelets by anticoagulation protocol: Yes   Plan:  Decrease heparin to 1350 units / hr Check 6 hour heparin level Daily heparin level and CBC  Monitor for s/sx of bleeding   Addendum:  PM heparin level therapeutic at 0.50.  Continue heparin 1350 units/hr  Check 6 hr heparin level - confirmatory   Andreas Ohm, PharmD Pharmacy Resident  03/04/2023 7:34 AM

## 2023-03-04 NOTE — Progress Notes (Signed)
Msg sent to cardmaster to cx 5/13 TEE-DCCV

## 2023-03-05 ENCOUNTER — Telehealth: Payer: Self-pay | Admitting: Cardiology

## 2023-03-05 DIAGNOSIS — I4892 Unspecified atrial flutter: Secondary | ICD-10-CM

## 2023-03-05 DIAGNOSIS — R042 Hemoptysis: Secondary | ICD-10-CM

## 2023-03-05 NOTE — Telephone Encounter (Signed)
On call outpatient service line:  CC: Hematemesis  Patient was recently admitted on 03/03/2023 for atrial flutter.  He was then discharged on 03/04/2023 on Eliquis and is now reporting hematemesis today.  He states that he got home from work and had spaghetti for lunch and then took his his amiodarone and Eliquis.  After running some errands patient stated that he had a weird sensation in his throat and began coughing up bright red blood.  Patient denied any abdominal pain, hematuria, hematochezia.  He states that he also has other vague constellation of symptoms such as headache, eye twitching, slight nausea.  I discussed with the patient that it be very difficult to properly evaluate him over the phone and given his complex medical history and previous diagnosis of esophageal cancer I recommended that he seek emergent evaluation.  Patient is agreeable to plan and will be coming to the ER today.

## 2023-03-06 ENCOUNTER — Other Ambulatory Visit (HOSPITAL_COMMUNITY): Payer: Self-pay

## 2023-03-06 ENCOUNTER — Telehealth: Payer: Self-pay | Admitting: Physician Assistant

## 2023-03-06 SURGERY — ECHOCARDIOGRAM, TRANSESOPHAGEAL
Anesthesia: Monitor Anesthesia Care

## 2023-03-06 NOTE — Addendum Note (Signed)
Addended by: Marlene Lard on: 03/06/2023 01:22 PM   Modules accepted: Orders

## 2023-03-06 NOTE — Telephone Encounter (Signed)
Yvonna Alanis was working Sunday yesterday and took this call from the patient below. I received a message in follow-up regarding his outpatient echo being scheduled and noted the patient had not gone to the emergency room for evaluation as instructed. Will route to our triage team to check in with patient to see what happened with the plan to go to ER. Given the recent afib, Eliquis, and his esophageal history would strongly recommend proceeding to expedite evaluation. Thank you.

## 2023-03-06 NOTE — Telephone Encounter (Signed)
Returned call to patient,   Patient states he just flipped out when he spit up the blood, so he called the after hours line. He hasn't had anymore blood come up. He states he has already gotten one bill for $900 and says he just cannot  afford another hospital bill at this time. He states that he will monitor and if it happens again before his appointment he will get checked out.

## 2023-03-06 NOTE — Telephone Encounter (Signed)
Patient contacted regarding discharge from [facility] on 03/04/23.  Patient understands to follow up with provider Jari Favre, PA on 03/17/23 at 08:25am at [address] [place], [place], [place] [address].  Patient understands discharge instructions? YES Patient understands medications and regiment? YES Patient understands to bring all medications to this visit? YES  Ask patient:  Are you enrolled in My Chart YES   If no ask patient if they would like to enroll.   Spoke directly with patient. Gave specific instructions on finding our office. Says he [facility] today for labs. No further questions or complications.

## 2023-03-06 NOTE — Telephone Encounter (Signed)
Per Jari Favre, ok to monitor for bleeding, but have labs done before appointment.   Returned call to patient, no answer, left message to call back.

## 2023-03-06 NOTE — Telephone Encounter (Signed)
Patient returned call to the office, transferred from call center,   Reviewed recommendations with the patient. He is agreeable to lab work and will stop by today. Labs ordered and placed up front with kay for patient to pick up.

## 2023-03-06 NOTE — Telephone Encounter (Signed)
-----   Message from Jonita Albee, PA-C sent at 03/04/2023  1:02 PM EDT ----- This patient is being discharged from [facility] today, and has a TOC appointment with Jari Favre PA-C on 5/24. Will need TOC phone call   Thanks! Kj

## 2023-03-07 LAB — CBC
Hematocrit: 45 % (ref 37.5–51.0)
Hemoglobin: 15.3 g/dL (ref 13.0–17.7)
MCH: 30.5 pg (ref 26.6–33.0)
MCHC: 34 g/dL (ref 31.5–35.7)
MCV: 90 fL (ref 79–97)
Platelets: 216 10*3/uL (ref 150–450)
RBC: 5.02 x10E6/uL (ref 4.14–5.80)
RDW: 12.8 % (ref 11.6–15.4)
WBC: 6.4 10*3/uL (ref 3.4–10.8)

## 2023-03-07 LAB — COMPREHENSIVE METABOLIC PANEL
ALT: 20 IU/L (ref 0–44)
AST: 20 IU/L (ref 0–40)
Albumin/Globulin Ratio: 2.3 — ABNORMAL HIGH (ref 1.2–2.2)
Albumin: 4.4 g/dL (ref 3.9–4.9)
Alkaline Phosphatase: 79 IU/L (ref 44–121)
BUN/Creatinine Ratio: 19 (ref 10–24)
BUN: 17 mg/dL (ref 8–27)
Bilirubin Total: 0.5 mg/dL (ref 0.0–1.2)
CO2: 19 mmol/L — ABNORMAL LOW (ref 20–29)
Calcium: 9.7 mg/dL (ref 8.6–10.2)
Chloride: 106 mmol/L (ref 96–106)
Creatinine, Ser: 0.91 mg/dL (ref 0.76–1.27)
Globulin, Total: 1.9 g/dL (ref 1.5–4.5)
Glucose: 111 mg/dL — ABNORMAL HIGH (ref 70–99)
Potassium: 3.9 mmol/L (ref 3.5–5.2)
Sodium: 143 mmol/L (ref 134–144)
Total Protein: 6.3 g/dL (ref 6.0–8.5)
eGFR: 95 mL/min/{1.73_m2} (ref >59)

## 2023-03-08 NOTE — Telephone Encounter (Addendum)
Noted. Hgb stable by labs 5/13. Will route to Dr. [facility] Salvia (primary cardiologist) and Dr. Tereso Newcomer (discharging physician) so they are aware. Pt called answering service 5/12 with isolated episode of spitting up blood, resolved without intervention, subsequent labs OK - pt has f/u scheduled 03/17/23. He knows to seek care for any recurrence whatsoever.

## 2023-03-16 NOTE — Progress Notes (Signed)
Office Visit    Patient Name: Nathaniel Turner Date of Encounter: 03/17/2023  PCP:  Soundra Pilon, FNP   [facility]  Cardiologist:  Chilton Si, MD  Advanced Practice Provider:  No care team member to display Electrophysiologist:  None   HPI    Nathaniel Turner is a 62 y.o. male with with a past medical history of myasthenia gravis, throat cancer, colitis and esophagitis, diabetes mellitus type 2, metastatic melanoma, hypertension, and hyperlipidemia presents today for follow-up.  He has no prior cardiac history.  Has a history of throat cancer treated with Yervoy with resulting in colitis.  Colitis now treated with Lialda with rebound symptoms when he misses medications.  Followed by [facility] for seronegative ocular myasthenia gravis.  He has not had an EMG consistent with diagnosis of myasthenia gravis now tapered off of prednisone.  Treated with methotrexate and CellCept.  Seen by GI 10/23 with no mention of ongoing esophagitis.  He underwent throughout radiation with subsequent esophagitis and dysphagia.  Having trouble swallowing food, especially bread.  Smoked since the 1980s.  Also COVID-19 infection treated with Paxlovid, did not require hospitalization.  He has no prior cardiac history.  Presented to [facility] 03/03/2023 with worsening DOE x 2 weeks with diaphoresis with exertion.  Had a syncopal episode about 1 week prior to last appointment.  Stood up and became dizzy, lightheaded, short of breath.  Stepped out of his work trailer and fell to his knees and everything went black.  Describe loss of consciousness.  Monitor showed atrial flutter with PR 152.  Given Cardizem gtt., IV heparin and admitted to cardiology service.  This was considered new onset atrial flutter.  Patient was started on IV amiodarone and converted to normal sinus rhythm.  IV Cardizem was stopped.  Transition to p.o. amio.  Today, he tells me that his blood pressure had been low in the past but  then was very elevated with his pulse 156 bpm.  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  Was told to call EMS.  Since then, no further episodes.  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  He usually stops around 2 PM but works 7 days a week.  Drinks lots of water since started to drink some electrolyte drinks as well.  He loves his sweets.  He states that sugars have been relatively well-controlled and usually when he wakes up in the morning blood sugar is around 107.  Did not have any symptoms really with elevated pulse rate (atrial flutter).  No dizziness, chest pain, or shortness of breath.  He does smoke some cannabis from time to time as well as indulge in a beer occasionally.  He has an echocardiogram scheduled for 610 over at [facility].  Reports no shortness of breath nor dyspnea on exertion. Reports no chest pain, pressure, or tightness. No edema, orthopnea, PND. Reports no palpitations.   Past Medical History    Past Medical History:  Diagnosis Date   Anxiety and depression    Bursitis    left shoulder   Colitis    secondary to [facility] treatment   Colitis    Colon polyp    adenomatous   Diabetic peripheral neuropathy (HCC) 03/07/2018   Esophagitis    severe-due to [facility] treatment   History of throat cancer    Hyperlipidemia    Hypertension    Internal hemorrhoids without  mention of complication    Melanoma (HCC)    melanoma dx 2007;   Metastatic malignant melanoma (HCC)    T2DM (type 2 diabetes mellitus) (HCC)    Past Surgical History:  Procedure Laterality Date   AXILLARY LYMPH NODE BIOPSY Right 06/17/2014   hx. melanoma 2 spots bx.   COLONOSCOPY     MELANOMA EXCISION  2008/2015   Lymph node removal/neck and groin   POLYPECTOMY     radiation treatment     7 weeks in 2013   SHOULDER SURGERY  10/24/2008   left shoulder    Allergies  Allergies  Allergen Reactions   Codeine Itching   Oxycodone  Rash   EKGs/Labs/Other Studies Reviewed:   The following studies were reviewed today: None.    EKG:  EKG is not ordered today.    Recent Labs: 03/03/2023: B Natriuretic Peptide 403.1; Magnesium 2.3; TSH 2.358 03/06/2023: ALT 20; BUN 17; Creatinine, Ser 0.91; Hemoglobin 15.3; Platelets 216; Potassium 3.9; Sodium 143  Recent Lipid Panel    Component Value Date/Time   CHOL 122 03/04/2023 0557   TRIG 77 03/04/2023 0557   HDL 34 (L) 03/04/2023 0557   CHOLHDL 3.6 03/04/2023 0557   VLDL 15 03/04/2023 0557   LDLCALC 73 03/04/2023 0557    Risk Assessment/Calculations:   CHA2DS2-VASc Score = 2   This indicates a 2.2% annual risk of stroke. The patient's score is based upon: CHF History: 0 HTN History: 1 Diabetes History: 1 Stroke History: 0 Vascular Disease History: 0 Age Score: 0 Gender Score: 0   Home Medications   Current Meds  Medication Sig   amiodarone (PACERONE) 200 MG tablet Take 200 mg by mouth daily. Pt takes 1 tablet once a day.   apixaban (ELIQUIS) 5 MG TABS tablet Take 1 tablet (5 mg total) by mouth 2 (two) times daily.   hydrochlorothiazide (HYDRODIURIL) 12.5 MG tablet Take 12.5 mg by mouth daily.   JARDIANCE 25 MG TABS tablet Take 25 mg by mouth daily.   losartan (COZAAR) 100 MG tablet Take 100 mg by mouth daily.   mesalamine (LIALDA) 1.2 g EC tablet Take 4 tablets (4.8 g total) by mouth daily with breakfast.   metFORMIN (GLUCOPHAGE) 500 MG tablet Take 1,000 mg by mouth daily with breakfast.   Multiple Vitamin (MULTI-VITAMIN) tablet Take 1 tablet by mouth daily.   rosuvastatin (CRESTOR) 10 MG tablet Take 10 mg by mouth daily.     Review of Systems      All other systems reviewed and are otherwise negative except as noted above.  Physical Exam    VS:  BP 122/62   Pulse 71   Ht 6\' 1"  (1.854 m)   Wt 203 lb 9.6 oz (92.4 kg)   SpO2 98%   BMI 26.86 kg/m  , BMI Body mass index is 26.86 kg/m.  Wt Readings from Last 3 Encounters:  03/17/23 203 lb 9.6 oz  (92.4 kg)  03/04/23 198 lb 8 oz (90 kg)  11/09/22 198 lb (89.8 kg)     GEN: Well nourished, well developed, in no acute distress. HEENT: normal. Neck: Supple, no JVD, carotid bruits, or masses. Cardiac: RRR, no murmurs, rubs, or gallops. No clubbing, cyanosis, edema.  Radials/PT 2+ and equal bilaterally.  Respiratory:  Respirations regular and unlabored, clear to auscultation bilaterally. GI: Soft, nontender, nondistended. MS: No deformity or atrophy. Skin: Warm and dry, no rash. Neuro:  Strength and sensation are intact. Psych: Normal affect.  Assessment & Plan  New onset Atrial Flutter -Discussed not smoking cannabis while taking amiodarone as it can increase its effects -Continue current medication regimen which includes amiodarone 200 mg daily, Eliquis 5 mg twice daily, Jardiance 25 mg daily, HCTZ 12.5 mg daily, Cozaar 100 mg daily, Crestor 10 mg daily   Elevated BNP, need outpatient echo -echo already arranged   HTN -well controlled today -continue current medication regimen -continue to monitor at home  HLD -LDL 73 -Triglycerides 77 -Continue Crestor 10 mg daily -lipid panel due in a year         Disposition: Follow up 3-4 months with Chilton Si, MD or APP.  Signed, Sharlene Dory, PA-C 03/17/2023, 10:32 AM [facility]

## 2023-03-17 ENCOUNTER — Encounter: Payer: Self-pay | Admitting: Physician Assistant

## 2023-03-17 ENCOUNTER — Ambulatory Visit: Payer: Managed Care, Other (non HMO) | Attending: Physician Assistant | Admitting: Physician Assistant

## 2023-03-17 VITALS — BP 122/62 | HR 71 | Ht 73.0 in | Wt 203.6 lb

## 2023-03-17 DIAGNOSIS — R7989 Other specified abnormal findings of blood chemistry: Secondary | ICD-10-CM | POA: Diagnosis not present

## 2023-03-17 DIAGNOSIS — I4892 Unspecified atrial flutter: Secondary | ICD-10-CM | POA: Diagnosis not present

## 2023-03-17 DIAGNOSIS — I10 Essential (primary) hypertension: Secondary | ICD-10-CM

## 2023-03-17 DIAGNOSIS — R55 Syncope and collapse: Secondary | ICD-10-CM

## 2023-03-17 DIAGNOSIS — E785 Hyperlipidemia, unspecified: Secondary | ICD-10-CM | POA: Diagnosis not present

## 2023-03-17 NOTE — Patient Instructions (Addendum)
Medication Instructions:  Your physician recommends that you continue on your current medications as directed. Please refer to the Current Medication list given to you today.  *If you need a refill on your cardiac medications before your next appointment, please call your pharmacy*  Lab Work: None ordered If you have labs (blood work) drawn today and your tests are completely normal, you will receive your results only by: MyChart Message (if you have MyChart) OR A paper copy in the mail If you have any lab test that is abnormal or we need to change your treatment, we will call you to review the results.  Testing/Procedures: Echo as scheduled  Follow-Up: At [facility], you and your health needs are our priority.  As part of our continuing mission to provide you with exceptional heart care, we have created designated Provider Care Teams.  These Care Teams include your primary Cardiologist (physician) and Advanced Practice Providers (APPs -  Physician Assistants and Nurse Practitioners) who all work together to provide you with the care you need, when you need it.  Your next appointment:   3-4 month(s)  Provider:   Chilton Si, MD   Low-Sodium Eating Plan Salt (sodium) helps you keep a healthy balance of fluids in your body. Too much sodium can raise your blood pressure. It can also cause fluid and waste to be held in your body. Your health care provider or dietitian may recommend a low-sodium eating plan if you have high blood pressure (hypertension), kidney disease, liver disease, or heart failure. Eating less sodium can help lower your blood pressure and reduce swelling. It can also protect your heart, liver, and kidneys. What are tips for following this plan? Reading food labels  Check food labels for the amount of sodium per serving. If you eat more than one serving, you must multiply the listed amount by the number of servings. Choose foods with less than 140  milligrams (mg) of sodium per serving. Avoid foods with 300 mg of sodium or more per serving. Always check how much sodium is in a product, even if the label says "unsalted" or "no salt added." Shopping  Buy products labeled as "low-sodium" or "no salt added." Buy fresh foods. Avoid canned foods and pre-made or frozen meals. Avoid canned, cured, or processed meats. Buy breads that have less than 80 mg of sodium per slice. Cooking  Eat more home-cooked food. Try to eat less restaurant, buffet, and fast food. Try not to add salt when you cook. Use salt-free seasonings or herbs instead of table salt or sea salt. Check with your provider or pharmacist before using salt substitutes. Cook with plant-based oils, such as canola, sunflower, or olive oil. Meal planning When eating at a restaurant, ask if your food can be made with less salt or no salt. Avoid dishes labeled as brined, pickled, cured, or smoked. Avoid dishes made with soy sauce, miso, or teriyaki sauce. Avoid foods that have monosodium glutamate (MSG) in them. MSG may be added to some restaurant food, sauces, soups, bouillon, and canned foods. Make meals that can be grilled, baked, poached, roasted, or steamed. These are often made with less sodium. General information Try to limit your sodium intake to 1,500-2,300 mg each day, or the amount told by your provider. What foods should I eat? Fruits Fresh, frozen, or canned fruit. Fruit juice. Vegetables Fresh or frozen vegetables. "No salt added" canned vegetables. "No salt added" tomato sauce and paste. Low-sodium or reduced-sodium tomato and vegetable juice.  Grains Low-sodium cereals, such as oats, puffed wheat and rice, and shredded wheat. Low-sodium crackers. Unsalted rice. Unsalted pasta. Low-sodium bread. Whole grain breads and whole grain pasta. Meats and other proteins Fresh or frozen meat, poultry, seafood, and fish. These should have no added salt. Low-sodium canned tuna and  salmon. Unsalted nuts. Dried peas, beans, and lentils without added salt. Unsalted canned beans. Eggs. Unsalted nut butters. Dairy Milk. Soy milk. Cheese that is naturally low in sodium, such as ricotta cheese, fresh mozzarella, or Swiss cheese. Low-sodium or reduced-sodium cheese. Cream cheese. Yogurt. Seasonings and condiments Fresh and dried herbs and spices. Salt-free seasonings. Low-sodium mustard and ketchup. Sodium-free salad dressing. Sodium-free light mayonnaise. Fresh or refrigerated horseradish. Lemon juice. Vinegar. Other foods Homemade, reduced-sodium, or low-sodium soups. Unsalted popcorn and pretzels. Low-salt or salt-free chips. The items listed above may not be all the foods and drinks you can have. Talk to a dietitian to learn more. What foods should I avoid? Vegetables Sauerkraut, pickled vegetables, and relishes. Olives. [place] fries. Onion rings. Regular canned vegetables, except low-sodium or reduced-sodium items. Regular canned tomato sauce and paste. Regular tomato and vegetable juice. Frozen vegetables in sauces. Grains Instant hot cereals. Bread stuffing, pancake, and biscuit mixes. Croutons. Seasoned rice or pasta mixes. Noodle soup cups. Boxed or frozen macaroni and cheese. Regular salted crackers. Self-rising flour. Meats and other proteins Meat or fish that is salted, canned, smoked, spiced, or pickled. Precooked or cured meat, such as sausages or meat loaves. Tomasa Blase. Ham. Pepperoni. Hot dogs. Corned beef. Chipped beef. Salt pork. Jerky. Pickled herring, anchovies, and sardines. Regular canned tuna. Salted nuts. Dairy Processed cheese and cheese spreads. Hard cheeses. Cheese curds. Blue cheese. Feta cheese. String cheese. Regular cottage cheese. Buttermilk. Canned milk. Fats and oils Salted butter. Regular margarine. Ghee. Bacon fat. Seasonings and condiments Onion salt, garlic salt, seasoned salt, table salt, and sea salt. Canned and packaged gravies. Worcestershire  sauce. Tartar sauce. Barbecue sauce. Teriyaki sauce. Soy sauce, including reduced-sodium soy sauce. Steak sauce. Fish sauce. Oyster sauce. Cocktail sauce. Horseradish that you find on the shelf. Regular ketchup and mustard. Meat flavorings and tenderizers. Bouillon cubes. Hot sauce. Pre-made or packaged marinades. Pre-made or packaged taco seasonings. Relishes. Regular salad dressings. Salsa. Other foods Salted popcorn and pretzels. Corn chips and puffs. Potato and tortilla chips. Canned or dried soups. Pizza. Frozen entrees and pot pies. The items listed above may not be all the foods and drinks you should avoid. Talk to a dietitian to learn more. This information is not intended to replace advice given to you by your health care provider. Make sure you discuss any questions you have with your health care provider. Document Revised: 10/27/2022 Document Reviewed: 10/27/2022 Elsevier Patient Education  2024 [employer]  Heart-Healthy Eating Plan Many factors influence your heart health, including eating and exercise habits. Heart health is also called coronary health. Coronary risk increases with abnormal blood fat (lipid) levels. A heart-healthy eating plan includes limiting unhealthy fats, increasing healthy fats, limiting salt (sodium) intake, and making other diet and lifestyle changes. What is my plan? Your health care provider may recommend that: You limit your fat intake to _________% or less of your total calories each day. You limit your saturated fat intake to _________% or less of your total calories each day. You limit the amount of cholesterol in your diet to less than _________ mg per day. You limit the amount of sodium in your diet to less than _________ mg per day. What are  tips for following this plan? Cooking Cook foods using methods other than frying. Baking, boiling, grilling, and broiling are all good options. Other ways to reduce fat include: Removing the skin from  poultry. Removing all visible fats from meats. Steaming vegetables in water or broth. Meal planning  At meals, imagine dividing your plate into fourths: Fill one-half of your plate with vegetables and green salads. Fill one-fourth of your plate with whole grains. Fill one-fourth of your plate with lean protein foods. Eat 2-4 cups of vegetables per day. One cup of vegetables equals 1 cup (91 g) broccoli or cauliflower florets, 2 medium carrots, 1 large bell pepper, 1 large sweet potato, 1 large tomato, 1 medium white potato, 2 cups (150 g) raw leafy greens. Eat 1-2 cups of fruit per day. One cup of fruit equals 1 small apple, 1 large banana, 1 cup (237 g) mixed fruit, 1 large orange,  cup (82 g) dried fruit, 1 cup (240 mL) 100% fruit juice. Eat more foods that contain soluble fiber. Examples include apples, broccoli, carrots, beans, peas, and barley. Aim to get 25-30 g of fiber per day. Increase your consumption of legumes, nuts, and seeds to 4-5 servings per week. One serving of dried beans or legumes equals  cup (90 g) cooked, 1 serving of nuts is  oz (12 almonds, 24 pistachios, or 7 walnut halves), and 1 serving of seeds equals  oz (8 g). Fats Choose healthy fats more often. Choose monounsaturated and polyunsaturated fats, such as olive and canola oils, avocado oil, flaxseeds, walnuts, almonds, and seeds. Eat more omega-3 fats. Choose salmon, mackerel, sardines, tuna, flaxseed oil, and ground flaxseeds. Aim to eat fish at least 2 times each week. Check food labels carefully to identify foods with trans fats or high amounts of saturated fat. Limit saturated fats. These are found in animal products, such as meats, butter, and cream. Plant sources of saturated fats include palm oil, palm kernel oil, and coconut oil. Avoid foods with partially hydrogenated oils in them. These contain trans fats. Examples are stick margarine, some tub margarines, cookies, crackers, and other baked goods. Avoid  fried foods. General information Eat more home-cooked food and less restaurant, buffet, and fast food. Limit or avoid alcohol. Limit foods that are high in added sugar and simple starches such as foods made using white refined flour (white breads, pastries, sweets). Lose weight if you are overweight. Losing just 5-10% of your body weight can help your overall health and prevent diseases such as diabetes and heart disease. Monitor your sodium intake, especially if you have high blood pressure. Talk with your health care provider about your sodium intake. Try to incorporate more vegetarian meals weekly. What foods should I eat? Fruits All fresh, canned (in natural juice), or frozen fruits. Vegetables Fresh or frozen vegetables (raw, steamed, roasted, or grilled). Green salads. Grains Most grains. Choose whole wheat and whole grains most of the time. Rice and pasta, including brown rice and pastas made with whole wheat. Meats and other proteins Lean, well-trimmed beef, veal, pork, and lamb. Chicken and [place] without skin. All fish and shellfish. Wild duck, rabbit, pheasant, and venison. Egg whites or low-cholesterol egg substitutes. Dried beans, peas, lentils, and tofu. Seeds and most nuts. Dairy Low-fat or nonfat cheeses, including ricotta and mozzarella. Skim or 1% milk (liquid, powdered, or evaporated). Buttermilk made with low-fat milk. Nonfat or low-fat yogurt. Fats and oils Non-hydrogenated (trans-free) margarines. Vegetable oils, including soybean, sesame, sunflower, olive, avocado, peanut, safflower, corn, canola,  and cottonseed. Salad dressings or mayonnaise made with a vegetable oil. Beverages Water (mineral or sparkling). Coffee and tea. Unsweetened ice tea. Diet beverages. Sweets and desserts Sherbet, gelatin, and fruit ice. Small amounts of dark chocolate. Limit all sweets and desserts. Seasonings and condiments All seasonings and condiments. The items listed above may not be a  complete list of foods and beverages you can eat. Contact a dietitian for more options. What foods should I avoid? Fruits Canned fruit in heavy syrup. Fruit in cream or butter sauce. Fried fruit. Limit coconut. Vegetables Vegetables cooked in cheese, cream, or butter sauce. Fried vegetables. Grains Breads made with saturated or trans fats, oils, or whole milk. Croissants. Sweet rolls. Donuts. High-fat crackers, such as cheese crackers and chips. Meats and other proteins Fatty meats, such as hot dogs, ribs, sausage, bacon, rib-eye roast or steak. High-fat deli meats, such as salami and bologna. Caviar. Domestic duck and goose. Organ meats, such as liver. Dairy Cream, sour cream, cream cheese, and creamed cottage cheese. Whole-milk cheeses. Whole or 2% milk (liquid, evaporated, or condensed). Whole buttermilk. Cream sauce or high-fat cheese sauce. Whole-milk yogurt. Fats and oils Meat fat, or shortening. Cocoa butter, hydrogenated oils, palm oil, coconut oil, palm kernel oil. Solid fats and shortenings, including bacon fat, salt pork, lard, and butter. Nondairy cream substitutes. Salad dressings with cheese or sour cream. Beverages Regular sodas and any drinks with added sugar. Sweets and desserts Frosting. Pudding. Cookies. Cakes. Pies. Milk chocolate or white chocolate. Buttered syrups. Full-fat ice cream or ice cream drinks. The items listed above may not be a complete list of foods and beverages to avoid. Contact a dietitian for more information. Summary Heart-healthy meal planning includes limiting unhealthy fats, increasing healthy fats, limiting salt (sodium) intake and making other diet and lifestyle changes. Lose weight if you are overweight. Losing just 5-10% of your body weight can help your overall health and prevent diseases such as diabetes and heart disease. Focus on eating a balance of foods, including fruits and vegetables, low-fat or nonfat dairy, lean protein, nuts and legumes,  whole grains, and heart-healthy oils and fats. This information is not intended to replace advice given to you by your health care provider. Make sure you discuss any questions you have with your health care provider. Document Revised: 11/15/2021 Document Reviewed: 11/15/2021 Elsevier Patient Education  2024 [employer].

## 2023-04-03 ENCOUNTER — Ambulatory Visit (INDEPENDENT_AMBULATORY_CARE_PROVIDER_SITE_OTHER): Payer: Managed Care, Other (non HMO)

## 2023-04-03 DIAGNOSIS — I088 Other rheumatic multiple valve diseases: Secondary | ICD-10-CM | POA: Diagnosis not present

## 2023-04-03 DIAGNOSIS — I4892 Unspecified atrial flutter: Secondary | ICD-10-CM

## 2023-04-03 LAB — ECHOCARDIOGRAM COMPLETE
Area-P 1/2: 3.65 cm2
MV M vel: 5.9 m/s
MV Peak grad: 139.2 mmHg
S' Lateral: 2.06 cm

## 2023-06-23 ENCOUNTER — Encounter (HOSPITAL_BASED_OUTPATIENT_CLINIC_OR_DEPARTMENT_OTHER): Payer: Self-pay | Admitting: Emergency Medicine

## 2023-06-23 ENCOUNTER — Other Ambulatory Visit: Payer: Self-pay

## 2023-06-23 ENCOUNTER — Emergency Department (HOSPITAL_BASED_OUTPATIENT_CLINIC_OR_DEPARTMENT_OTHER)
Admission: EM | Admit: 2023-06-23 | Discharge: 2023-06-23 | Discharge status: Against Medical Advice | Payer: Managed Care, Other (non HMO) | Attending: Emergency Medicine | Admitting: Emergency Medicine

## 2023-06-23 DIAGNOSIS — W448XXA Other foreign body entering into or through a natural orifice, initial encounter: Secondary | ICD-10-CM | POA: Diagnosis not present

## 2023-06-23 DIAGNOSIS — Z5321 Procedure and treatment not carried out due to patient leaving prior to being seen by health care provider: Secondary | ICD-10-CM | POA: Insufficient documentation

## 2023-06-23 DIAGNOSIS — T1501XA Foreign body in cornea, right eye, initial encounter: Secondary | ICD-10-CM | POA: Diagnosis present

## 2023-06-23 NOTE — ED Triage Notes (Signed)
Pt [facility] to ED from [facility] with c/o a piece of cat litter in his right eye. Pt notes [facility] was not able to remove the cat littler and pt was unable to see an eye doctor today. Pt notes vision is not affected.

## 2023-07-31 NOTE — Progress Notes (Signed)
Cardiology Office Note:  .    Date:  07/31/2023  ID:  Kingsley Callander, DOB 1961/07/29, MRN [id number] PCP: Soundra Pilon, FNP  [facility] Providers Cardiologist:  Chilton Si, MD { Click to update primary MD,subspecialty MD or APP then REFRESH:1}    History of Present Illness: Nathaniel Turner    Nathaniel Turner is a 62 y.o. male with paroxysmal atrial flutter, hypertension, hyperlipidemia, myasthenia gravis, throat cancer, colitis and esophagitis, type 2 diabetes, metastatic melanoma, who presents for follow-up and to establish care with me. He was last seen 03/17/2023 by Jari Favre, PA-C. Per her notes, he was noted to have a history of throat cancer treated with [facility] resulting in colitis, now treated with Lialda with rebound symptoms when he misses medications. Underwent radiation therapy with subsequent esophagitis and dysphagia. When he was seen by GI 07/2022 there was no mention of ongoing esophagitis. He is followed by [facility] for seronegative ocular myasthenia gravis. He had not had an EMG consistent with diagnosis of myasthenia gravis. Tapered off of prednisone, treated with methotrexate and CellCept. On 03/03/2023 he presented to [facility] with worsening DOE for 2 weeks and a recent syncopal episode. He wore a monitor that showed atrial flutter with PR 152. He was given diltiazem, IV heparin, and admitted to cardiology service. Considered to be new onset atrial flutter. He converted to NSR on IV amiodarone and transitioned to p.o. amiodarone. At his visit with Julian Hy he was continued on a regimen of amiodarone 200 mg daily, Eliquis 5 mg BID, Jardiance 25 mg daily, HCTZ 12.5 mg daily, Cozaar 100 mg daily, and Crestor 10 mg daily. They discussed cessation of smoking cannabis while taking amiodarone as it can increase its effects. He had an echo 03/2023 revealing LVEF 60-65%, normal diastolic parameters, mild mitral regurgitation without stenosis, and aortic sclerosis/calcification without stenosis.    Today,  He denies any palpitations, chest pain, shortness of breath, peripheral edema, lightheadedness, headaches, syncope, orthopnea, or PND.  ROS:  Please see the history of present illness. All other systems are reviewed and negative.  (+)  Studies Reviewed: .        Echo  04/04/2023:  1. Left ventricular ejection fraction, by estimation, is 60 to 65%. Left  ventricular ejection fraction by 3D volume is 59 %. The left ventricle has  normal function. The left ventricle has no regional wall motion  abnormalities. Left ventricular diastolic   parameters were normal. The average left ventricular global longitudinal  strain is -16.8 %. The global longitudinal strain is normal.   2. Right ventricular systolic function is normal. The right ventricular  size is normal. There is normal pulmonary artery systolic pressure. The  estimated right ventricular systolic pressure is 9.2 mmHg.   3. Right atrial size was mildly dilated.   4. The mitral valve is normal in structure. Mild mitral valve  regurgitation. No evidence of mitral stenosis.   5. The aortic valve is tricuspid. Aortic valve regurgitation is not  visualized. Aortic valve sclerosis/calcification is present, without any  evidence of aortic stenosis.   6. The inferior vena cava is normal in size with greater than 50%  respiratory variability, suggesting right atrial pressure of 3 mmHg.   Risk Assessment/Calculations:   {Does this patient have ATRIAL FIBRILLATION?:[phone]} No BP recorded.  {Refresh Note OR Click here to enter BP  :1}***       Physical Exam:    VS:  There were no vitals taken for this visit. , BMI There is  no height or weight on file to calculate BMI. GENERAL:  Well appearing HEENT: Pupils equal round and reactive, fundi not visualized, oral mucosa unremarkable NECK:  No jugular venous distention, waveform within normal limits, carotid upstroke brisk and symmetric, no bruits, no thyromegaly LYMPHATICS:  No  cervical adenopathy LUNGS:  Clear to auscultation bilaterally HEART:  RRR.  PMI not displaced or sustained,S1 and S2 within normal limits, no S3, no S4, no clicks, no rubs, *** murmurs ABD:  Flat, positive bowel sounds normal in frequency in pitch, no bruits, no rebound, no guarding, no midline pulsatile mass, no hepatomegaly, no splenomegaly EXT:  2 plus pulses throughout, no edema, no cyanosis no clubbing SKIN:  No rashes no nodules NEURO:  Cranial nerves II through XII grossly intact, motor grossly intact throughout PSYCH:  Cognitively intact, oriented to person place and time  Wt Readings from Last 3 Encounters:  06/23/23 213 lb (96.6 kg)  03/17/23 203 lb 9.6 oz (92.4 kg)  03/04/23 198 lb 8 oz (90 kg)     ASSESSMENT AND PLAN: .    No problem-specific Assessment & Plan notes found for this encounter.  ***Plan: -     {Are you ordering a CV Procedure (e.g. stress test, cath, DCCV, TEE, etc)?   Press F2        :[id number]}  Dispo:  FU with Brynley Cuddeback C. [facility] Salvia, MD, [facility] in ***  I,Mathew Stumpf,acting as a scribe for Chilton Si, MD.,have documented all relevant documentation on the behalf of Chilton Si, MD,as directed by  Chilton Si, MD while in the presence of Chilton Si, MD.  ***  Signed, Carlena Bjornstad

## 2023-08-03 ENCOUNTER — Telehealth (HOSPITAL_BASED_OUTPATIENT_CLINIC_OR_DEPARTMENT_OTHER): Payer: Self-pay | Admitting: *Deleted

## 2023-08-03 ENCOUNTER — Other Ambulatory Visit (HOSPITAL_BASED_OUTPATIENT_CLINIC_OR_DEPARTMENT_OTHER): Payer: Managed Care, Other (non HMO)

## 2023-08-03 ENCOUNTER — Encounter (HOSPITAL_BASED_OUTPATIENT_CLINIC_OR_DEPARTMENT_OTHER): Payer: Self-pay | Admitting: Cardiovascular Disease

## 2023-08-03 ENCOUNTER — Ambulatory Visit (INDEPENDENT_AMBULATORY_CARE_PROVIDER_SITE_OTHER): Payer: Managed Care, Other (non HMO) | Admitting: Cardiovascular Disease

## 2023-08-03 ENCOUNTER — Telehealth (HOSPITAL_BASED_OUTPATIENT_CLINIC_OR_DEPARTMENT_OTHER): Payer: Self-pay

## 2023-08-03 VITALS — BP 126/72 | HR 76 | Ht 73.0 in | Wt 213.5 lb

## 2023-08-03 DIAGNOSIS — I4892 Unspecified atrial flutter: Secondary | ICD-10-CM | POA: Diagnosis not present

## 2023-08-03 DIAGNOSIS — R413 Other amnesia: Secondary | ICD-10-CM | POA: Insufficient documentation

## 2023-08-03 DIAGNOSIS — I10 Essential (primary) hypertension: Secondary | ICD-10-CM | POA: Diagnosis not present

## 2023-08-03 DIAGNOSIS — E782 Mixed hyperlipidemia: Secondary | ICD-10-CM

## 2023-08-03 DIAGNOSIS — R0683 Snoring: Secondary | ICD-10-CM

## 2023-08-03 DIAGNOSIS — R400 Somnolence: Secondary | ICD-10-CM

## 2023-08-03 HISTORY — DX: Other amnesia: R41.3

## 2023-08-03 NOTE — Telephone Encounter (Signed)
Spoke with patient who had been given a zio monitor today.  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

## 2023-08-03 NOTE — Telephone Encounter (Signed)
Itamar ordered today in clinic, please advise if PA needed

## 2023-08-03 NOTE — Telephone Encounter (Signed)
Left message to call back regarding Itamar machine

## 2023-08-03 NOTE — Telephone Encounter (Signed)
Spoke with patient and

## 2023-08-03 NOTE — Addendum Note (Signed)
Addended by: Marlene Lard on: 08/03/2023 01:46 PM   Modules accepted: Orders

## 2023-08-03 NOTE — Patient Instructions (Addendum)
Medication Instructions:  Your physician recommends that you continue on your current medications as directed. Please refer to the Current Medication list given to you today.   *If you need a refill on your cardiac medications before your next appointment, please call your pharmacy*  Lab Work: NONE  Testing/Procedures: [facility] HOME SLEEP STUDY   7 DAY ZIO   Follow-Up: At [facility], you and your health needs are our priority.  As part of our continuing mission to provide you with exceptional heart care, we have created designated Provider Care Teams.  These Care Teams include your primary Cardiologist (physician) and Advanced Practice Providers (APPs -  Physician Assistants and Nurse Practitioners) who all work together to provide you with the care you need, when you need it.  We recommend signing up for the patient portal called "MyChart".  Sign up information is provided on this After Visit Summary.  MyChart is used to connect with patients for Virtual Visits (Telemedicine).  Patients are able to view lab/test results, encounter notes, upcoming appointments, etc.  Non-urgent messages can be sent to your provider as well.   To learn more about what you can do with MyChart, go to [email or web address].    Your next appointment:   6 month(s)  Provider:   Chilton Si, MD OR Ronn Melena NP   You have been referred to NEUROLOGY AND ELECTROPHYSIOLOGY    Other Instructions ZIO XT- Long Term Monitor Instructions  Your physician has requested you wear a ZIO patch monitor for 14 days.  This is a single patch monitor. [employer] supplies one patch monitor per enrollment. Additional stickers are not available. Please do not apply patch if you will be having a Nuclear Stress Test,  Echocardiogram, Cardiac CT, MRI, or Chest Xray during the period you would be wearing the  monitor. The patch cannot be worn during these tests. You cannot remove and re-apply the  ZIO XT patch monitor.   Your ZIO patch monitor will be mailed 3 day USPS to your address on file. It may take 3-5 days  to receive your monitor after you have been enrolled.  Once you have received your monitor, please review the enclosed instructions. Your monitor  has already been registered assigning a specific monitor serial # to you.  Billing and Patient Assistance Program Information  We have supplied [employer] with any of your insurance information on file for billing purposes. [employer] offers a sliding scale Patient Assistance Program for patients that do not have  insurance, or whose insurance does not completely cover the cost of the ZIO monitor.  You must apply for the Patient Assistance Program to qualify for this discounted rate.  To apply, please call [employer] at [phone], select option 4, select option 2, ask to apply for  Patient Assistance Program. Meredeth Ide will ask your household income, and how many people  are in your household. They will quote your out-of-pocket cost based on that information.  [employer] will also be able to set up a 31-month, interest-free payment plan if needed.  Applying the monitor   Shave hair from upper left chest.  Hold abrader disc by orange tab. Rub abrader in 40 strokes over the upper left chest as  indicated in your monitor instructions.  Clean area with 4 enclosed alcohol pads. Let dry.  Apply patch as indicated in monitor instructions. Patch will be placed under collarbone on left  side of chest with arrow pointing upward.  Rub patch adhesive wings for 2 minutes. Remove  white label marked "1". Remove the white  label marked "2". Rub patch adhesive wings for 2 additional minutes.  While looking in a mirror, press and release button in center of patch. A small green light will  flash 3-4 times. This will be your only indicator that the monitor has been turned on.  Do not shower for the first 24 hours. You may shower after the first 24 hours.  Press the button if  you feel a symptom. You will hear a small click. Record Date, Time and  Symptom in the Patient Logbook.  When you are ready to remove the patch, follow instructions on the last 2 pages of Patient  Logbook. Stick patch monitor onto the last page of Patient Logbook.  Place Patient Logbook in the blue and white box. Use locking tab on box and tape box closed  securely. The blue and white box has prepaid postage on it. Please place it in the mailbox as  soon as possible. Your physician should have your test results approximately 7 days after the  monitor has been mailed back to [facility].  Call [facility] Customer Care at [phone] if you have questions regarding  your ZIO XT patch monitor. Call them immediately if you see an orange light blinking on your  monitor.  If your monitor falls off in less than 4 days, contact our Monitor department at [phone].  If your monitor becomes loose or falls off after 4 days call [employer] at [phone] for  suggestions on securing your monitor     WatchPAT?  Is a FDA cleared portable home sleep study test that uses a watch and 3 points of contact to monitor 7 different channels, including your heart rate, oxygen saturations, body position, snoring, and chest motion.  The study is easy to use from the comfort of your own home and accurately detect sleep apnea.  Before bed, you attach the chest sensor, attached the sleep apnea bracelet to your nondominant hand, and attach the finger probe.  After the study, the raw data is downloaded from the watch and scored for apnea events.   For more information: [email or web address]  Patient Testing Instructions:  Do not put battery into the device until bedtime when you are ready to begin the test. Please call the support number if you need assistance after following the instructions below: 24 hour support line- [phone] or ITAMAR support at [phone] (option 2)  Download the  IntelWatchPAT One" app through the google play store or App Store  Be sure to turn on or enable access to bluetooth in settlings on your smartphone/ device  Make sure no other bluetooth devices are on and within the vicinity of your smartphone/ device and WatchPAT watch during testing.  Make sure to leave your smart phone/ device plugged in and charging all night.  When ready for bed:  Follow the instructions step by step in the WatchPAT One App to activate the testing device. For additional instructions, including video instruction, visit the WatchPAT One video on Youtube. You can search for WatchPat One within Youtube (video is 4 minutes and 18 seconds) or enter: https://youtube/watch?[email or web address]_vbiwxE Please note: You will be prompted to enter a Pin to connect via bluetooth when starting the test. The PIN will be assigned to you when you receive the test.  The device is disposable, but it recommended that you retain the device until you receive a call letting you know the study has been received and the results have been  interpreted.  We will let you know if the study did not transmit to [place] properly after the test is completed. You do not need to call us to confirm the receipt of the test.  Please complete the test within 48 hours of receiving PIN.   Frequently Asked Questions:  What is Watch Dennie Bible one?  A single use fully disposable home sleep apnea testing device and will not need to be returned after completion.  What are the requirements to use WatchPAT one?  The be able to have a successful watchpat one sleep study, you should have your Watch pat one device, your smart phone, watch pat one app, your PIN number and Internet access What type of phone do I need?  You should have a smart phone that uses Android 5.1 and above or any Iphone with IOS 10 and above How can I download the WatchPAT one app?  Based on your device type search for WatchPAT one app either in google play for android devices  or APP store for Iphone's Where will I get my PIN for the study?  Your PIN will be provided by your physician's office. It is used for authentication and if you lose/forget your PIN, please reach out to your providers office.  I do not have Internet at home. Can I do WatchPAT one study?  WatchPAT One needs Internet connection throughout the night to be able to transmit the sleep data. You can use your home/local internet or your cellular's data package. However, it is always recommended to use home/local Internet. It is estimated that between 20MB-30MB will be used with each study.However, the application will be looking for space in the phone to start the study.  What happens if I lose internet or bluetooth connection?  During the internet disconnection, your phone will not be able to transmit the sleep data. All the data, will be stored in your phone. As soon as the internet connection is back on, the phone will being sending the sleep data. During the bluetooth disconnection, WatchPAT one will not be able to to send the sleep data to your phone. Data will be kept in the [facility] one until two devices have bluetooth connection back on. As soon as the connection is back on, WatchPAT one will send the sleep data to the phone.  How long do I need to wear the WatchPAT one?  After you start the study, you should wear the device at least 6 hours.  How far should I keep my phone from the device?  During the night, your phone should be within 15 feet.  What happens if I leave the room for restroom or other reasons?  Leaving the room for any reason will not cause any problem. As soon as your get back to the room, both devices will reconnect and will continue to send the sleep data. Can I use my phone during the sleep study?  Yes, you can use your phone as usual during the study. But it is recommended to put your watchpat one on when you are ready to go to bed.  How will I get my study results?  A soon  as you completed your study, your sleep data will be sent to the provider. They will then share the results with you when they are ready.

## 2023-08-03 NOTE — Addendum Note (Signed)
Addended by: Regis Bill B on: 08/03/2023 04:08 PM   Modules accepted: Orders

## 2023-08-04 NOTE — Telephone Encounter (Signed)
Error with patient zio order, placing new order per Neeral from ZIO.

## 2023-08-04 NOTE — Addendum Note (Signed)
Addended by: Marlene Lard on: 08/04/2023 08:01 AM   Modules accepted: Orders

## 2023-08-04 NOTE — Addendum Note (Signed)
Addended by: Marlene Lard on: 08/04/2023 08:02 AM   Modules accepted: Orders

## 2023-08-17 ENCOUNTER — Telehealth (HOSPITAL_BASED_OUTPATIENT_CLINIC_OR_DEPARTMENT_OTHER): Payer: Self-pay | Admitting: *Deleted

## 2023-08-17 NOTE — Telephone Encounter (Signed)
Below message received regarding neurology referral   Mamie Levers, LPN 40/[id number]/1191 4:78 AM EDT General Pt has been to two diff neuro groups, will need to f/u with one of them. Audelia Hives W Note Text: Pt has been to two diff neuro groups, will need to f/u with one of them.  Left message to call back

## 2023-08-22 ENCOUNTER — Ambulatory Visit: Payer: Managed Care, Other (non HMO) | Attending: Cardiovascular Disease

## 2023-08-22 ENCOUNTER — Telehealth: Payer: Self-pay | Admitting: Cardiovascular Disease

## 2023-08-22 ENCOUNTER — Other Ambulatory Visit: Payer: Self-pay | Admitting: Internal Medicine

## 2023-08-22 DIAGNOSIS — I4892 Unspecified atrial flutter: Secondary | ICD-10-CM

## 2023-08-22 NOTE — Progress Notes (Unsigned)
Enrolled for [employer] to mail a ZIO XT long term holter monitor to the patients address on file. Requested shipping date of 09/27/23.

## 2023-08-22 NOTE — Addendum Note (Signed)
Addended by: Marlene Lard on: 08/22/2023 02:53 PM   Modules accepted: Orders

## 2023-08-22 NOTE — Telephone Encounter (Signed)
Prescription refill request for Eliquis received. Indication:aflutter Last office visit:10/24 Scr:0.91  5/24 Age: 62 Weight:96.8  kg  Prescription refilled

## 2023-08-22 NOTE — Telephone Encounter (Signed)
Pt trying to get his results for his EKG done on 10/10. Please advise

## 2023-08-29 ENCOUNTER — Other Ambulatory Visit: Payer: Self-pay

## 2023-08-29 MED ORDER — AMIODARONE HCL 200 MG PO TABS: 200.0000 mg | ORAL_TABLET | Freq: Every day | ORAL | 3 refills | Status: DC

## 2023-08-31 ENCOUNTER — Other Ambulatory Visit: Payer: Self-pay | Admitting: Physician Assistant

## 2023-09-08 ENCOUNTER — Encounter: Payer: Self-pay | Admitting: Gastroenterology

## 2023-09-11 NOTE — H&P (View-Only) (Signed)
 Electrophysiology Office Note:   Date:  09/12/2023  ID:  Nathaniel Turner, DOB 04-11-1961, MRN [id number]  Primary Cardiologist: Chilton Si, MD Electrophysiologist: [name], MD      History of Present Illness:   Nathaniel Turner is a 62 y.o. male with h/o atrial flutter, hypertension, hyperlipidemia, ocular myasthenia gravis, throat cancer, colitis and esophagitis, type 2 diabetes, metastatic melanoma who is seen today for evaluation of his atrial flutter at the request of Dr. [facility] Salvia.  Patient presented to the ED on 03/03/2023 with worsening dyspnea on exertion and a syncopal episode.  He was found to be in atrial flutter with RVR.  He ultimately converted to normal sinus rhythm with IV amiodarone.  He was transition to oral amiodarone and scheduled to follow-up with cardiology.  He remains on oral amiodarone 200 mg once daily and Eliquis 5 mg twice daily.  He has had no known recurrences.  He does have a pulse oximeter and checks his heart rate periodically and is not aware of any abnormally fast heart rates.  He reports feeling well today and has no new or acute complaints.  He would like to get off of his amiodarone and Eliquis if possible.  Review of systems complete and found to be negative unless listed in HPI.   EP Information / Studies Reviewed:    EKG is not ordered today. EKG from 08/03/23 reviewed which showed sinus rhythm with 1st degree AV delay.     EKG 03/03/23:    Echo 04/03/23:  Normal LV size and function.  LVEF 60 to 65%. Normal RV size and function. Mildly dilated right atrium. Mild MR.  No significant valvular disease.  Risk Assessment/Calculations:    CHA2DS2-VASc Score = 2   This indicates a 2.2% annual risk of stroke. The patient's score is based upon: CHF History: 0 HTN History: 1 Diabetes History: 1 Stroke History: 0 Vascular Disease History: 0 Age Score: 0 Gender Score: 0        STOP-Bang Score:  5       Physical Exam:   VS:  BP 132/60    Pulse 75   Ht 6\' 1"  (1.854 m)   Wt 208 lb (94.3 kg)   SpO2 97%   BMI 27.44 kg/m    Wt Readings from Last 3 Encounters:  09/12/23 208 lb (94.3 kg)  08/03/23 213 lb 8 oz (96.8 kg)  06/23/23 213 lb (96.6 kg)     GEN: Well nourished, well developed in no acute distress NECK: No JVD; No carotid bruits CARDIAC: Normal rate and regular rhythm RESPIRATORY:  Clear to auscultation without rales, wheezing or rhonchi  ABDOMEN: Soft, non-tender, non-distended EXTREMITIES:  No edema; No deformity   ASSESSMENT AND PLAN:   Nathaniel Turner is a 62 y.o. male with h/o atrial flutter, hypertension, hyperlipidemia, ocular myasthenia gravis, throat cancer, colitis and esophagitis, type 2 diabetes, metastatic melanoma who is seen today for evaluation of his atrial flutter at the request of Dr. [facility] Salvia.  He reports that his cancer treatments were curative and there are no immediate plans for surgeries/interventions.  #Typical atrial flutter, symptomatic:  - Continue amiodarone for now.  - Discussed treatment options today for AFL including antiarrhythmic drug therapy and ablation. Discussed risks, recovery and likelihood of success with each treatment strategy. Risk, benefits, and alternatives to EP study and ablation for AFL were discussed. These risks include but are not limited to stroke, bleeding, vascular damage, tamponade, perforation, damage to the esophagus, lungs, phrenic nerve and other structures,  pulmonary vein stenosis, worsening renal function, coronary vasospasm and death.  Discussed potential need for repeat ablation procedures and antiarrhythmic drugs after an initial ablation. The patient understands these risk and wishes to proceed.  We will therefore proceed with catheter ablation at the next available time.  Carto, ICE, anesthesia are requested for the procedure.  Will also obtain CT PV protocol prior to the procedure to exclude LAA thrombus and further evaluate atrial anatomy. - Patient would  like to discontinue his Eliquis.  We will place loop recorder at time of ablation to monitor for atrial flutter and atrial fibrillation recurrences in hopes of being able to stop oral anticoagulation.  He has a CHA2DS2-VASc of 2.  #Secondary hypercoagulable state due to atrial flutter:  - CHADSVASC score of 2 - Continue Eliquis  #Hypertension At goal today.  Recommend checking blood pressures 1-2 times per week at home and recording the values.  Recommend bringing these recordings to the primary care physician.  Signed, [name], MD

## 2023-09-11 NOTE — Progress Notes (Unsigned)
Electrophysiology Office Note:   Date:  09/12/2023  ID:  Nathaniel Turner, DOB 04-11-1961, MRN [id number]  Primary Cardiologist: Chilton Si, MD Electrophysiologist: [name], MD      History of Present Illness:   Nathaniel Turner is a 62 y.o. male with h/o atrial flutter, hypertension, hyperlipidemia, ocular myasthenia gravis, throat cancer, colitis and esophagitis, type 2 diabetes, metastatic melanoma who is seen today for evaluation of his atrial flutter at the request of Dr. Duke Salvia.  Patient presented to the ED on 03/03/2023 with worsening dyspnea on exertion and a syncopal episode.  He was found to be in atrial flutter with RVR.  He ultimately converted to normal sinus rhythm with IV amiodarone.  He was transition to oral amiodarone and scheduled to follow-up with cardiology.  He remains on oral amiodarone 200 mg once daily and Eliquis 5 mg twice daily.  He has had no known recurrences.  He does have a pulse oximeter and checks his heart rate periodically and is not aware of any abnormally fast heart rates.  He reports feeling well today and has no new or acute complaints.  He would like to get off of his amiodarone and Eliquis if possible.  Review of systems complete and found to be negative unless listed in HPI.   EP Information / Studies Reviewed:    EKG is not ordered today. EKG from 08/03/23 reviewed which showed sinus rhythm with 1st degree AV delay.     EKG 03/03/23:    Echo 04/03/23:  Normal LV size and function.  LVEF 60 to 65%. Normal RV size and function. Mildly dilated right atrium. Mild MR.  No significant valvular disease.  Risk Assessment/Calculations:    CHA2DS2-VASc Score = 2   This indicates a 2.2% annual risk of stroke. The patient's score is based upon: CHF History: 0 HTN History: 1 Diabetes History: 1 Stroke History: 0 Vascular Disease History: 0 Age Score: 0 Gender Score: 0        STOP-Bang Score:  5       Physical Exam:   VS:  BP 132/60    Pulse 75   Ht 6\' 1"  (1.854 m)   Wt 208 lb (94.3 kg)   SpO2 97%   BMI 27.44 kg/m    Wt Readings from Last 3 Encounters:  09/12/23 208 lb (94.3 kg)  08/03/23 213 lb 8 oz (96.8 kg)  06/23/23 213 lb (96.6 kg)     GEN: Well nourished, well developed in no acute distress NECK: No JVD; No carotid bruits CARDIAC: Normal rate and regular rhythm RESPIRATORY:  Clear to auscultation without rales, wheezing or rhonchi  ABDOMEN: Soft, non-tender, non-distended EXTREMITIES:  No edema; No deformity   ASSESSMENT AND PLAN:   Nathaniel Turner is a 62 y.o. male with h/o atrial flutter, hypertension, hyperlipidemia, ocular myasthenia gravis, throat cancer, colitis and esophagitis, type 2 diabetes, metastatic melanoma who is seen today for evaluation of his atrial flutter at the request of Dr. Duke Salvia.  He reports that his cancer treatments were curative and there are no immediate plans for surgeries/interventions.  #Typical atrial flutter, symptomatic:  - Continue amiodarone for now.  - Discussed treatment options today for AFL including antiarrhythmic drug therapy and ablation. Discussed risks, recovery and likelihood of success with each treatment strategy. Risk, benefits, and alternatives to EP study and ablation for AFL were discussed. These risks include but are not limited to stroke, bleeding, vascular damage, tamponade, perforation, damage to the esophagus, lungs, phrenic nerve and other structures,  pulmonary vein stenosis, worsening renal function, coronary vasospasm and death.  Discussed potential need for repeat ablation procedures and antiarrhythmic drugs after an initial ablation. The patient understands these risk and wishes to proceed.  We will therefore proceed with catheter ablation at the next available time.  Carto, ICE, anesthesia are requested for the procedure.  Will also obtain CT PV protocol prior to the procedure to exclude LAA thrombus and further evaluate atrial anatomy. - Patient would  like to discontinue his Eliquis.  We will place loop recorder at time of ablation to monitor for atrial flutter and atrial fibrillation recurrences in hopes of being able to stop oral anticoagulation.  He has a CHA2DS2-VASc of 2.  #Secondary hypercoagulable state due to atrial flutter:  - CHADSVASC score of 2 - Continue Eliquis  #Hypertension At goal today.  Recommend checking blood pressures 1-2 times per week at home and recording the values.  Recommend bringing these recordings to the primary care physician.  Signed, [name], MD

## 2023-09-12 ENCOUNTER — Other Ambulatory Visit: Payer: Self-pay

## 2023-09-12 ENCOUNTER — Encounter: Payer: Self-pay | Admitting: Cardiology

## 2023-09-12 ENCOUNTER — Ambulatory Visit: Payer: Managed Care, Other (non HMO) | Attending: Cardiology | Admitting: Cardiology

## 2023-09-12 VITALS — BP 132/60 | HR 75 | Ht 73.0 in | Wt 208.0 lb

## 2023-09-12 DIAGNOSIS — I483 Typical atrial flutter: Secondary | ICD-10-CM

## 2023-09-12 DIAGNOSIS — I10 Essential (primary) hypertension: Secondary | ICD-10-CM

## 2023-09-12 DIAGNOSIS — R042 Hemoptysis: Secondary | ICD-10-CM

## 2023-09-12 DIAGNOSIS — D6869 Other thrombophilia: Secondary | ICD-10-CM | POA: Diagnosis not present

## 2023-09-12 NOTE — Patient Instructions (Addendum)
Medication Instructions:  Your physician recommends that you continue on your current medications as directed. Please refer to the Current Medication list given to you today.  *If you need a refill on your cardiac medications before your next appointment, please call your pharmacy*  Testing/Procedures: Your physician has recommended that you have an ablation. Catheter ablation is a medical procedure used to treat some cardiac arrhythmias (irregular heartbeats). During catheter ablation, a long, thin, flexible tube is put into a blood vessel in your groin (upper thigh), or neck. This tube is called an ablation catheter. It is then guided to your heart through the blood vessel. Radio frequency waves destroy small areas of heart tissue where abnormal heartbeats may cause an arrhythmia to start. Please see the instruction sheet given to you today.  You will also  have a loop recorder implanted at the time of your procedure.   Follow-Up: At [facility], you and your health needs are our priority.  As part of our continuing mission to provide you with exceptional heart care, we have created designated Provider Care Teams.  These Care Teams include your primary Cardiologist (physician) and Advanced Practice Providers (APPs -  Physician Assistants and Nurse Practitioners) who all work together to provide you with the care you need, when you need it.  Your next appointment:   4 weeks after your ablation

## 2023-09-13 LAB — CBC
Hematocrit: 48.4 % (ref 37.5–51.0)
Hemoglobin: 15.8 g/dL (ref 13.0–17.7)
MCH: 29.8 pg (ref 26.6–33.0)
MCHC: 32.6 g/dL (ref 31.5–35.7)
MCV: 91 fL (ref 79–97)
Platelets: 208 10*3/uL (ref 150–450)
RBC: 5.3 x10E6/uL (ref 4.14–5.80)
RDW: 13.2 % (ref 11.6–15.4)
WBC: 7 10*3/uL (ref 3.4–10.8)

## 2023-09-13 LAB — BASIC METABOLIC PANEL
BUN/Creatinine Ratio: 16 (ref 10–24)
BUN: 17 mg/dL (ref 8–27)
CO2: 27 mmol/L (ref 20–29)
Calcium: 9.4 mg/dL (ref 8.6–10.2)
Chloride: 105 mmol/L (ref 96–106)
Creatinine, Ser: 1.04 mg/dL (ref 0.76–1.27)
Glucose: 100 mg/dL — ABNORMAL HIGH (ref 70–99)
Potassium: 4 mmol/L (ref 3.5–5.2)
Sodium: 145 mmol/L — ABNORMAL HIGH (ref 134–144)
eGFR: 81 mL/min/{1.73_m2} (ref >59)

## 2023-09-27 NOTE — Anesthesia Preprocedure Evaluation (Signed)
Anesthesia Evaluation  Patient identified by MRN, date of birth, ID band Patient awake    Reviewed: Allergy & Precautions, NPO status , Patient's Chart, lab work & pertinent test results  History of Anesthesia Complications Negative for: history of anesthetic complications  Airway Mallampati: II  TM Distance: >3 FB Neck ROM: Full   Comment: Radiation for throat ca, raspy voice Dental  (+) Teeth Intact, Dental Advisory Given   Pulmonary neg shortness of breath, neg sleep apnea, neg COPD, neg recent URI, former smoker   Pulmonary exam normal breath sounds clear to auscultation       Cardiovascular hypertension, Pt. on medications (-) angina (-) Past MI Normal cardiovascular exam+ dysrhythmias Atrial Fibrillation  Rhythm:Regular     Neuro/Psych  PSYCHIATRIC DISORDERS Anxiety Depression     Neuromuscular disease    GI/Hepatic negative GI ROS, Neg liver ROS,,,  Endo/Other  diabetes, Type 2    Renal/GU negative Renal ROS     Musculoskeletal   Abdominal Normal abdominal exam  (+)   Peds  Hematology Lab Results      Component                Value               Date                      WBC                      7.0                 09/12/2023                HGB                      15.8                09/12/2023                HCT                      48.4                09/12/2023                MCV                      91                  09/12/2023                PLT                      208                 09/12/2023             eliquis   Anesthesia Other Findings MPRESSIONS     1. Left ventricular ejection fraction, by estimation, is 60 to 65%. Left  ventricular ejection fraction by 3D volume is 59 %. The left ventricle has  normal function. The left ventricle has no regional wall motion  abnormalities. Left ventricular diastolic   parameters were normal. The average left ventricular global longitudinal  strain is  -16.8 %. The global longitudinal strain is normal.   2. Right ventricular systolic function is normal. The right ventricular  size is normal. There is normal pulmonary artery systolic pressure. The  estimated right ventricular systolic pressure is 9.2 mmHg.       Reproductive/Obstetrics                             Anesthesia Physical Anesthesia Plan  ASA: 2  Anesthesia Plan: General   Post-op Pain Management: Minimal or no pain anticipated   Induction: Intravenous  PONV Risk Score and Plan: 2 and Ondansetron, Midazolam, Treatment may vary due to age or medical condition, Propofol infusion and TIVA  Airway Management Planned: Oral ETT and Video Laryngoscope Planned  Additional Equipment: None  Intra-op Plan:   Post-operative Plan: Extubation in OR  Informed Consent: I have reviewed the patients History and Physical, chart, labs and discussed the procedure including the risks, benefits and alternatives for the proposed anesthesia with the patient or authorized representative who has indicated his/her understanding and acceptance.     Dental advisory given  Plan Discussed with: CRNA  Anesthesia Plan Comments: (Start with 7.0 ett)       Anesthesia Quick Evaluation

## 2023-09-27 NOTE — Pre-Procedure Instructions (Signed)
Instructed patient on the following items: Arrival time 0515 Nothing to eat or drink after midnight No meds AM of procedure Responsible person to drive you home and stay with you for 24 hrs  Have you missed any doses of anti-coagulant Eliquis- takes twice a day, hasn't missed any doses.  Don't take dose in the morning.

## 2023-09-28 ENCOUNTER — Other Ambulatory Visit: Payer: Self-pay

## 2023-09-28 ENCOUNTER — Ambulatory Visit (HOSPITAL_BASED_OUTPATIENT_CLINIC_OR_DEPARTMENT_OTHER): Payer: Self-pay | Admitting: Certified Registered"

## 2023-09-28 ENCOUNTER — Ambulatory Visit (HOSPITAL_COMMUNITY): Payer: Self-pay | Admitting: Certified Registered"

## 2023-09-28 ENCOUNTER — Ambulatory Visit (HOSPITAL_COMMUNITY)
Admission: RE | Disposition: A | Discharge status: Home / Self Care | Payer: Self-pay | Source: Home / Self Care | Attending: Cardiology

## 2023-09-28 ENCOUNTER — Ambulatory Visit (HOSPITAL_COMMUNITY)
Admission: RE | Admit: 2023-09-28 | Discharge: 2023-09-28 | Disposition: A | Discharge status: Home / Self Care | Payer: Managed Care, Other (non HMO) | Attending: Cardiology | Admitting: Cardiology

## 2023-09-28 DIAGNOSIS — I4892 Unspecified atrial flutter: Secondary | ICD-10-CM

## 2023-09-28 DIAGNOSIS — Z79899 Other long term (current) drug therapy: Secondary | ICD-10-CM | POA: Insufficient documentation

## 2023-09-28 DIAGNOSIS — Z7901 Long term (current) use of anticoagulants: Secondary | ICD-10-CM | POA: Diagnosis not present

## 2023-09-28 DIAGNOSIS — D6869 Other thrombophilia: Secondary | ICD-10-CM | POA: Insufficient documentation

## 2023-09-28 DIAGNOSIS — I483 Typical atrial flutter: Secondary | ICD-10-CM | POA: Insufficient documentation

## 2023-09-28 DIAGNOSIS — I10 Essential (primary) hypertension: Secondary | ICD-10-CM | POA: Insufficient documentation

## 2023-09-28 DIAGNOSIS — E119 Type 2 diabetes mellitus without complications: Secondary | ICD-10-CM | POA: Diagnosis not present

## 2023-09-28 HISTORY — PX: A-FLUTTER ABLATION: EP1230

## 2023-09-28 HISTORY — PX: LOOP RECORDER INSERTION: EP1214

## 2023-09-28 LAB — GLUCOSE, CAPILLARY
Glucose-Capillary: 126 mg/dL — ABNORMAL HIGH (ref 70–99)
Glucose-Capillary: 151 mg/dL — ABNORMAL HIGH (ref 70–99)

## 2023-09-28 SURGERY — A-FLUTTER ABLATION
Anesthesia: General

## 2023-09-28 MED ORDER — ACETAMINOPHEN 10 MG/ML IV SOLN
INTRAVENOUS | Status: DC | PRN
  Administered 2023-09-28: 1000 mg via INTRAVENOUS

## 2023-09-28 MED ORDER — ACETAMINOPHEN 325 MG PO TABS
650.0000 mg | ORAL_TABLET | ORAL | Status: DC | PRN
Start: 2023-09-28 — End: 2023-09-28

## 2023-09-28 MED ORDER — HEPARIN SODIUM (PORCINE) 1000 UNIT/ML IJ SOLN
INTRAMUSCULAR | Status: AC
  Filled 2023-09-28: qty 10

## 2023-09-28 MED ORDER — SUGAMMADEX SODIUM 200 MG/2ML IV SOLN
INTRAVENOUS | Status: DC | PRN
  Administered 2023-09-28: 200 mg via INTRAVENOUS

## 2023-09-28 MED ORDER — LIDOCAINE 2% (20 MG/ML) 5 ML SYRINGE
INTRAMUSCULAR | Status: DC | PRN
  Administered 2023-09-28: 100 mg via INTRAVENOUS

## 2023-09-28 MED ORDER — HEPARIN SODIUM (PORCINE) 1000 UNIT/ML IJ SOLN
INTRAMUSCULAR | Status: DC | PRN
  Administered 2023-09-28: 1000 [IU] via INTRAVENOUS

## 2023-09-28 MED ORDER — DEXAMETHASONE SODIUM PHOSPHATE 10 MG/ML IJ SOLN
INTRAMUSCULAR | Status: DC | PRN
  Administered 2023-09-28: 5 mg via INTRAVENOUS

## 2023-09-28 MED ORDER — FENTANYL CITRATE (PF) 100 MCG/2ML IJ SOLN
INTRAMUSCULAR | Status: AC
  Filled 2023-09-28: qty 2

## 2023-09-28 MED ORDER — HEPARIN (PORCINE) IN NACL 1000-0.9 UT/500ML-% IV SOLN
INTRAVENOUS | Status: DC | PRN
  Administered 2023-09-28: 500 mL

## 2023-09-28 MED ORDER — FENTANYL CITRATE (PF) 250 MCG/5ML IJ SOLN
INTRAMUSCULAR | Status: DC | PRN
  Administered 2023-09-28 (×2): 50 ug via INTRAVENOUS

## 2023-09-28 MED ORDER — LACTATED RINGERS IV SOLN: INTRAVENOUS | Status: DC | PRN

## 2023-09-28 MED ORDER — ONDANSETRON HCL 4 MG/2ML IJ SOLN: 4.0000 mg | Freq: Four times a day (QID) | INTRAMUSCULAR | Status: DC | PRN

## 2023-09-28 MED ORDER — ONDANSETRON HCL 4 MG/2ML IJ SOLN
INTRAMUSCULAR | Status: DC | PRN
  Administered 2023-09-28: 4 mg via INTRAVENOUS

## 2023-09-28 MED ORDER — PROPOFOL 10 MG/ML IV BOLUS
INTRAVENOUS | Status: DC | PRN
  Administered 2023-09-28: 50 mg via INTRAVENOUS
  Administered 2023-09-28: 150 mg via INTRAVENOUS

## 2023-09-28 MED ORDER — SODIUM CHLORIDE 0.9% FLUSH: 3.0000 mL | Freq: Two times a day (BID) | INTRAVENOUS | Status: DC

## 2023-09-28 MED ORDER — APIXABAN 5 MG PO TABS
5.0000 mg | ORAL_TABLET | Freq: Once | ORAL | Status: AC
  Administered 2023-09-28: 5 mg via ORAL
  Filled 2023-09-28: qty 1

## 2023-09-28 MED ORDER — ROCURONIUM BROMIDE 10 MG/ML (PF) SYRINGE
PREFILLED_SYRINGE | INTRAVENOUS | Status: DC | PRN
  Administered 2023-09-28: 60 mg via INTRAVENOUS
  Administered 2023-09-28: 20 mg via INTRAVENOUS

## 2023-09-28 MED ORDER — SODIUM CHLORIDE 0.9 % IV SOLN: 250.0000 mL | INTRAVENOUS | Status: DC | PRN

## 2023-09-28 MED ORDER — LIDOCAINE-EPINEPHRINE 1 %-1:100000 IJ SOLN
INTRAMUSCULAR | Status: AC
  Filled 2023-09-28: qty 1

## 2023-09-28 MED ORDER — SODIUM CHLORIDE 0.9% FLUSH: 3.0000 mL | INTRAVENOUS | Status: DC | PRN

## 2023-09-28 MED ORDER — SODIUM CHLORIDE 0.9 % IV SOLN: INTRAVENOUS | Status: DC

## 2023-09-28 MED ORDER — PHENYLEPHRINE HCL-NACL 20-0.9 MG/250ML-% IV SOLN
INTRAVENOUS | Status: DC | PRN
  Administered 2023-09-28: 50 ug/min via INTRAVENOUS

## 2023-09-28 MED ORDER — PHENYLEPHRINE 80 MCG/ML (10ML) SYRINGE FOR IV PUSH (FOR BLOOD PRESSURE SUPPORT)
PREFILLED_SYRINGE | INTRAVENOUS | Status: DC | PRN
  Administered 2023-09-28: 80 ug via INTRAVENOUS

## 2023-09-28 SURGICAL SUPPLY — 19 items
BAG SNAP BAND KOVER 36X36 (MISCELLANEOUS) IMPLANT
CATH OCTARAY 2.0 F 3-3-3-3-3 (CATHETERS) IMPLANT
CATH SMTCH THERMOCOOL SF DF (CATHETERS) IMPLANT
CATH SOUNDSTAR ECO 8FR (CATHETERS) IMPLANT
CATH WEBSTER BI DIR CS D-F CRV (CATHETERS) IMPLANT
CLOSURE PERCLOSE PROSTYLE (VASCULAR PRODUCTS) IMPLANT
DEVICE CLOSURE MYNXGRIP 6/7F (Vascular Products) IMPLANT
MONITOR LUX-DX II+ M312 (Prosthesis & Implant Heart) IMPLANT
PACK EP LF (CUSTOM PROCEDURE TRAY) ×1 IMPLANT
PACK LOOP INSERTION (CUSTOM PROCEDURE TRAY) ×1 IMPLANT
PAD DEFIB RADIO PHYSIO CONN (PAD) ×1 IMPLANT
PATCH CARTO3 (PAD) IMPLANT
SHEATH CARTO VIZIGO MED CURVE (SHEATH) IMPLANT
SHEATH INTROD W/O MIN 9FR 25CM (SHEATH) IMPLANT
SHEATH PINNACLE 8F 10CM (SHEATH) IMPLANT
SHEATH PINNACLE 9F 10CM (SHEATH) IMPLANT
SHEATH PROBE COVER 6X72 (BAG) IMPLANT
TUBING SMART ABLATE COOLFLOW (TUBING) IMPLANT
WIRE HI TORQ VERSACORE-J 145CM (WIRE) IMPLANT

## 2023-09-28 NOTE — Anesthesia Procedure Notes (Signed)
Procedure Name: Intubation Date/Time: 09/28/2023 7:57 AM  Performed by: Alwyn Ren, CRNAPre-anesthesia Checklist: Patient identified, Emergency Drugs available, Suction available and Patient being monitored Patient Re-evaluated:Patient Re-evaluated prior to induction Oxygen Delivery Method: Circle system utilized Preoxygenation: Pre-oxygenation with 100% oxygen Induction Type: IV induction Ventilation: Two handed mask ventilation required Laryngoscope Size: Glidescope and 3 Grade View: Grade I Tube type: Oral Tube size: 7.0 mm Number of attempts: 1 Airway Equipment and Method: Oral airway and Rigid stylet Placement Confirmation: ETT inserted through vocal cords under direct vision, positive ETCO2 and breath sounds checked- equal and bilateral Secured at: 22 cm Tube secured with: Tape Dental Injury: Teeth and Oropharynx as per pre-operative assessment

## 2023-09-28 NOTE — Interval H&P Note (Signed)
History and Physical Interval Note:  09/28/2023 7:26 AM  Nathaniel Turner  has presented today for surgery, with the diagnosis of aflutter.  He is doing well. No complaints today. No missed doses of blood thinner. The various methods of treatment have been discussed again with the patient. After consideration of risks, benefits and other options for treatment, the patient has consented to  Procedure(s): A-FLUTTER ABLATION (N/A) LOOP RECORDER INSERTION (N/A) as a surgical intervention.  The patient's history has been reviewed, patient examined, no change in status, stable for surgery.  I have reviewed the patient's chart and labs.  Questions were answered to the patient's satisfaction.     [name]

## 2023-09-28 NOTE — Discharge Instructions (Addendum)
Stop amiodarone.  Cardiac Ablation, Care After  This sheet gives you information about how to care for yourself after your procedure. Your health care provider may also give you more specific instructions. If you have problems or questions, contact your health care provider. What can I expect after the procedure? After the procedure, it is common to have: Bruising around your puncture site. Tenderness around your puncture site. Skipped heartbeats. If you had an atrial fibrillation ablation, you may have atrial fibrillation during the first several months after your procedure.  Tiredness (fatigue).  Follow these instructions at home: Puncture site care  Follow instructions from your health care provider about how to take care of your puncture site. Make sure you: If present, leave stitches (sutures), skin glue, or adhesive strips in place. These skin closures may need to stay in place for up to 2 weeks. If adhesive strip edges start to loosen and curl up, you may trim the loose edges. Do not remove adhesive strips completely unless your health care provider tells you to do that. If a large square bandage is present, this may be removed 24 hours after surgery.  Check your puncture site every day for signs of infection. Check for: Redness, swelling, or pain. Fluid or blood. If your puncture site starts to bleed, lie down on your back, apply firm pressure to the area, and contact your health care provider. Warmth. Pus or a bad smell. A pea or small marble sized lump at the site is normal and can take up to three months to resolve.  Driving Do not drive for at least 4 days after your procedure or however long your health care provider recommends. (Do not resume driving if you have previously been instructed not to drive for other health reasons.) Do not drive or use heavy machinery while taking prescription pain medicine. Activity Avoid activities that take a lot of effort for at least 7 days  after your procedure. Do not lift anything that is heavier than 5 lb (4.5 kg) for one week.  No sexual activity for 1 week.  Return to your normal activities as told by your health care provider. Ask your health care provider what activities are safe for you. General instructions Take over-the-counter and prescription medicines only as told by your health care provider. Do not use any products that contain nicotine or tobacco, such as cigarettes and e-cigarettes. If you need help quitting, ask your health care provider. You may shower after 24 hours, but Do not take baths, swim, or use a hot tub for 1 week.  Do not drink alcohol for 24 hours after your procedure. Keep all follow-up visits as told by your health care provider. This is important. Contact a health care provider if: You have redness, mild swelling, or pain around your puncture site. You have fluid or blood coming from your puncture site that stops after applying firm pressure to the area. Your puncture site feels warm to the touch. You have pus or a bad smell coming from your puncture site. You have a fever. You have chest pain or discomfort that spreads to your neck, jaw, or arm. You have chest pain that is worse with lying on your back or taking a deep breath. You are sweating a lot. You feel nauseous. You have a fast or irregular heartbeat. You have shortness of breath. You are dizzy or light-headed and feel the need to lie down. You have pain or numbness in the arm or leg closest  to your puncture site. Get help right away if: Your puncture site suddenly swells. Your puncture site is bleeding and the bleeding does not stop after applying firm pressure to the area. These symptoms may represent a serious problem that is an emergency. Do not wait to see if the symptoms will go away. Get medical help right away. Call your local emergency services (911 in the [place] Do not drive yourself to the hospital. Summary After the  procedure, it is normal to have bruising and tenderness at the puncture site in your groin, neck, or forearm. Check your puncture site every day for signs of infection. Get help right away if your puncture site is bleeding and the bleeding does not stop after applying firm pressure to the area. This is a medical emergency. This information is not intended to replace advice given to you by your health care provider. Make sure you discuss any questions you have with your health care provider.

## 2023-09-28 NOTE — Transfer of Care (Signed)
Immediate Anesthesia Transfer of Care Note  Patient: Nathaniel Turner  Procedure(s) Performed: A-FLUTTER ABLATION LOOP RECORDER INSERTION  Patient Location: PACU  Anesthesia Type:General  Level of Consciousness: awake, alert , and oriented  Airway & Oxygen Therapy: Patient Spontanous Breathing  Post-op Assessment: Report given to RN and Post -op Vital signs reviewed and stable  Post vital signs: Reviewed and stable  Last Vitals:  Vitals Value Taken Time  BP    Temp    Pulse    Resp    SpO2      Last Pain:  Vitals:   09/28/23 0604  TempSrc: Oral         Complications: There were no known notable events for this encounter.

## 2023-09-29 ENCOUNTER — Telehealth: Payer: Self-pay

## 2023-09-29 ENCOUNTER — Encounter (HOSPITAL_COMMUNITY): Payer: Self-pay | Admitting: Cardiology

## 2023-09-29 MED FILL — Fentanyl Citrate Preservative Free (PF) Inj 100 MCG/2ML: INTRAMUSCULAR | Qty: 2 | Status: AC

## 2023-09-29 MED FILL — Lidocaine Inj 1% w/ Epinephrine-1:100000: INTRAMUSCULAR | Qty: 20 | Status: AC

## 2023-09-29 NOTE — Telephone Encounter (Signed)
Follow-up after same day discharge: Implant date: 09/28/2023 MD: [name] Device: [facility] Loop Recorder   Location: Left Chest  MD follow-up: 11/03/2023  Remote Transmission received:09/28/2023  Dressing/sling removed:   Confirm OAC restart on:   Please continue to monitor your cardiac device site for redness, swelling, and drainage. Call the device clinic at [phone] if you experience these symptoms, fever/chills, or have questions about your device.   Remote monitoring is used to monitor your cardiac device from home. This monitoring is scheduled every 91 days by our office. It allows [place] to keep an eye on the functioning of your device to ensure it is working properly.  LMOVM for pt to give [place] a call back.

## 2023-09-29 NOTE — Telephone Encounter (Signed)
I spoke with the patient and he has removed the outer bandage. I let him know that the steri strips will fall off by themself.

## 2023-10-03 NOTE — Anesthesia Postprocedure Evaluation (Signed)
Anesthesia Post Note  Patient: Nathaniel Turner  Procedure(s) Performed: A-FLUTTER ABLATION LOOP RECORDER INSERTION     Patient location during evaluation: PACU Anesthesia Type: General Level of consciousness: awake Pain management: pain level controlled Vital Signs Assessment: post-procedure vital signs reviewed and stable Respiratory status: spontaneous breathing Cardiovascular status: stable Postop Assessment: no apparent nausea or vomiting Anesthetic complications: no  There were no known notable events for this encounter.  Last Vitals:  Vitals:   09/28/23 1200 09/28/23 1300  BP: 133/78 119/69  Pulse: 72 72  Resp: 15 (!) 21  Temp:    SpO2: 94% 94%    Last Pain:  Vitals:   09/28/23 1300  TempSrc:   PainSc: 0-No pain                 Caren Macadam

## 2023-10-04 ENCOUNTER — Telehealth: Payer: Self-pay | Admitting: Cardiology

## 2023-10-04 NOTE — Telephone Encounter (Signed)
Pt states he has developed a knot near the area where ablation was (near groin area). Please advise if this is normal.

## 2023-10-04 NOTE — Telephone Encounter (Signed)
Spoke with the patient who states that he has a small know at his incision site after his ablation. Denies any redness, swelling, or pain. It has not gotten any larger. Advised that this is normal. He will call us back if anything changes or worsens.

## 2023-10-30 ENCOUNTER — Ambulatory Visit: Payer: Managed Care, Other (non HMO)

## 2023-10-30 DIAGNOSIS — I483 Typical atrial flutter: Secondary | ICD-10-CM

## 2023-11-01 ENCOUNTER — Telehealth: Payer: Self-pay

## 2023-11-01 LAB — CUP PACEART REMOTE DEVICE CHECK
Date Time Interrogation Session: 20250107145300
Pulse Gen Serial Number: 123515

## 2023-11-01 NOTE — Telephone Encounter (Signed)
 Alert received from [employer]:  ILR summary report received. Battery status OK. Normal device function. No new sympto or brady episodes. No new AF episodes.  1 pause episode, 4.4 seconds, recording shows SR/SB gradually slowing prior to pause followed by noise/artifact, can not rule out pause being longer than reported due to noise/artifact, sent to triage for review.  Pause from October 19, 2023 at 9:36 am. Outreach made to Pt.  Per Pt he does not remember any symptoms from this date/time.  He recently reestablished monitoring with his phone.  Pt has appointment this Friday November 02, 2022 with Dr. Kennyth. No action needed at this time.

## 2023-11-02 NOTE — Progress Notes (Signed)
 ` Electrophysiology Office Note:   Date:  11/03/2023  ID:  Nathaniel Turner, DOB 1961-08-27, MRN [id number]  Primary Cardiologist: Annabella Scarce, MD Electrophysiologist: Fonda Kitty, MD      History of Present Illness:   Nathaniel Turner is a 62 y.o. male with h/o atrial flutter, hypertension, hyperlipidemia, ocular myasthenia gravis, throat cancer, colitis and esophagitis, type 2 diabetes, metastatic melanoma who is seen today for evaluation of his atrial flutter at the request of Dr. Scarce.Patient presented to the ED on 03/03/2023 with worsening dyspnea on exertion and a syncopal episode.  He was found to be in atrial flutter with RVR.  He ultimately converted to normal sinus rhythm with IV amiodarone .  He was transition to oral amiodarone  and scheduled to follow-up with cardiology.  He remains on oral amiodarone  200 mg once daily and Eliquis  5 mg twice daily.  He has had no known recurrences.  He does have a pulse oximeter and checks his heart rate periodically and is not aware of any abnormally fast heart rates.  He would like to get off of his amiodarone  and Eliquis  if possible.  Interval History: Patient underwent CTI ablation for typical atrial flutter and loop recorder implant on 09/28/23.  He reports doing relatively well in the interim.  He denies any known history of atrial fibrillation or atrial flutter since his ablation.  He continues to be active working at [employer].  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  He is somewhat worried about being on a blood thinner due to to risk of injury with his job.  He also thinks a blood thinner makes him feel unwell during the cold weather.  He is hoping to come off in the near future.  Otherwise no new or acute complaints.  Review of systems complete and found to be negative unless listed in HPI.   EP Information / Studies Reviewed:    EKG is not ordered today. EKG from 09/28/23 reviewed which showed  sinus rhythm with IVCD.     EKG 03/03/23:    Echo 04/03/23:  Normal LV size and function.  LVEF 60 to 65%. Normal RV size and function. Mildly dilated right atrium. Mild MR.  No significant valvular disease.  Risk Assessment/Calculations:    CHA2DS2-VASc Score = 2   This indicates a 2.2% annual risk of stroke. The patient's score is based upon: CHF History: 0 HTN History: 1 Diabetes History: 1 Stroke History: 0 Vascular Disease History: 0 Age Score: 0 Gender Score: 0        STOP-Bang Score:  5       Physical Exam:   VS:  BP 128/60   Pulse 84   Ht 6' (1.829 m)   Wt 208 lb (94.3 kg)   SpO2 96%   BMI 28.21 kg/m    Wt Readings from Last 3 Encounters:  11/03/23 208 lb (94.3 kg)  09/28/23 205 lb (93 kg)  09/12/23 208 lb (94.3 kg)     GEN: Well nourished, well developed in no acute distress NECK: No JVD CARDIAC: Normal rate and regular rhythm RESPIRATORY:  Clear to auscultation without rales, wheezing or rhonchi  ABDOMEN: Soft, non-tender, non-distended EXTREMITIES:  No edema; No deformity   ASSESSMENT AND PLAN:   Nathaniel Turner is a 64 y.o. male with h/o atrial flutter, hypertension, hyperlipidemia, ocular myasthenia gravis, throat cancer, colitis and esophagitis, type 2 diabetes, metastatic melanoma who is seen today for evaluation of his  atrial flutter at the request of Dr. Raford.  He reports that his cancer treatments were curative and there are no immediate plans for surgeries/interventions.  #Typical atrial flutter, symptomatic: Status post CTI ablation on 09/28/2023.  No known recurrence since based on loop recorder interrogations. #Loop recorder in place: -Amiodarone  was discontinued at the time of ablation. -We will continue monitoring for recurrence of flutter or atrial fibrillation with loop recorder.  #Secondary hypercoagulable state due to atrial flutter:  - CHADSVASC score of 2. - He would like to come off Eliquis  due to increased bleeding risk with  follow-up. Continue Eliquis  until 25-month [place] post ablation. At that time he can stop his Eliquis  and we will monitor moving forward with loop recorder.  If he has any recurrence then we will restart Eliquis .  #Hypertension -At goal today.  Recommend checking blood pressures 1-2 times per week at home and recording the values.  Recommend bringing these recordings to the primary care physician.  #Vagally mediated bradycardia, pause of approximately 4 seconds: This was picked up on loop recorder.  Patient was asymptomatic. -Continue to monitor with loop recorder  Signed, Fonda Kitty, MD

## 2023-11-03 ENCOUNTER — Ambulatory Visit: Payer: Managed Care, Other (non HMO) | Attending: Cardiology | Admitting: Cardiology

## 2023-11-03 ENCOUNTER — Encounter: Payer: Self-pay | Admitting: Cardiology

## 2023-11-03 VITALS — BP 128/60 | HR 84 | Ht 72.0 in | Wt 208.0 lb

## 2023-11-03 DIAGNOSIS — D6869 Other thrombophilia: Secondary | ICD-10-CM | POA: Diagnosis not present

## 2023-11-03 DIAGNOSIS — I10 Essential (primary) hypertension: Secondary | ICD-10-CM

## 2023-11-03 DIAGNOSIS — R001 Bradycardia, unspecified: Secondary | ICD-10-CM

## 2023-11-03 DIAGNOSIS — I483 Typical atrial flutter: Secondary | ICD-10-CM | POA: Diagnosis not present

## 2023-11-03 NOTE — Patient Instructions (Signed)
 Medication Instructions:  Your physician has recommended you make the following change in your medication:  1) STOP taking Eliquis  once you run out of your current supply   *If you need a refill on your cardiac medications before your next appointment, please call your pharmacy*   Follow-Up: At [facility], you and your health needs are our priority.  As part of our continuing mission to provide you with exceptional heart care, we have created designated Provider Care Teams.  These Care Teams include your primary Cardiologist (physician) and Advanced Practice Providers (APPs -  Physician Assistants and Nurse Practitioners) who all work together to provide you with the care you need, when you need it.  Your next appointment:   6 months  Provider:   You may see Fonda Kitty, MD or one of the following Advanced Practice Providers on your designated Care Team:   Charlies Arthur, PA-C Michael Andy Tillery, PA-C Suzann Riddle, NP Daphne Barrack, NP

## 2023-11-10 ENCOUNTER — Other Ambulatory Visit: Payer: Self-pay

## 2023-11-29 ENCOUNTER — Other Ambulatory Visit: Payer: Self-pay | Admitting: Physician Assistant

## 2023-11-29 ENCOUNTER — Telehealth (HOSPITAL_BASED_OUTPATIENT_CLINIC_OR_DEPARTMENT_OTHER): Payer: Self-pay | Admitting: *Deleted

## 2023-11-29 NOTE — Telephone Encounter (Signed)
 Patient had Itamar home sleep study ordered 10/10, no activation code given Will forward to sleep team for follow up

## 2023-11-30 ENCOUNTER — Ambulatory Visit (INDEPENDENT_AMBULATORY_CARE_PROVIDER_SITE_OTHER): Payer: Managed Care, Other (non HMO)

## 2023-11-30 DIAGNOSIS — I483 Typical atrial flutter: Secondary | ICD-10-CM | POA: Diagnosis not present

## 2023-12-01 NOTE — Telephone Encounter (Signed)
**Note De-Identified Twilia Yaklin Obfuscation** Ordering provider: Dr Raford Associated diagnoses: Fatigue-R53.83, Snoring-R06.83 and A-Flutter-I48.92 WatchPAT PA obtained on 12/01/2023 by Refugio Mcconico, Avelina HERO, LPN. Authorization: Per the water quality scientist at [facility], a PA is not required for CPT Code: 04199 (Itamer-HST). Patient notified of PIN ([id number]) on 12/01/2023 Deaven Urwin Notification Method: MyChart message. I also left a message on the pts VM advising him of his WatchPAT One-HST Device Pin # and that I am sending him a [facility] message with his Pin # as well.  Phone note routed to covering staff for follow-up.

## 2023-12-07 LAB — CUP PACEART REMOTE DEVICE CHECK
Date Time Interrogation Session: 20250211235200
Pulse Gen Serial Number: 123515

## 2023-12-07 NOTE — Addendum Note (Signed)
Addended by: Geralyn Flash D on: 12/07/2023 02:19 PM   Modules accepted: Orders

## 2023-12-07 NOTE — Progress Notes (Signed)
Bsx Loop Recorder

## 2023-12-20 NOTE — Telephone Encounter (Signed)
 Patient returned WatchPat unopened

## 2023-12-21 ENCOUNTER — Telehealth: Payer: Self-pay

## 2023-12-21 NOTE — Telephone Encounter (Signed)
-----   Message from Nurse Janit Bern sent at 12/20/2023  6:04 PM EST ----- Hello all   Not sure who I notify but he brought his WatchPat back unopened   Thank you all for what you do !!!   Juliette Alcide

## 2023-12-21 NOTE — Telephone Encounter (Signed)
 Patient return unopened Itamar device, device is unregistered in [place].

## 2024-01-02 ENCOUNTER — Encounter: Payer: Self-pay | Admitting: Gastroenterology

## 2024-01-02 ENCOUNTER — Ambulatory Visit (INDEPENDENT_AMBULATORY_CARE_PROVIDER_SITE_OTHER): Payer: Managed Care, Other (non HMO) | Admitting: Gastroenterology

## 2024-01-02 VITALS — BP 130/68 | HR 86 | Ht 73.0 in | Wt 208.0 lb

## 2024-01-02 DIAGNOSIS — K449 Diaphragmatic hernia without obstruction or gangrene: Secondary | ICD-10-CM

## 2024-01-02 DIAGNOSIS — K529 Noninfective gastroenteritis and colitis, unspecified: Secondary | ICD-10-CM | POA: Diagnosis not present

## 2024-01-02 DIAGNOSIS — K219 Gastro-esophageal reflux disease without esophagitis: Secondary | ICD-10-CM | POA: Diagnosis not present

## 2024-01-02 DIAGNOSIS — R1310 Dysphagia, unspecified: Secondary | ICD-10-CM | POA: Diagnosis not present

## 2024-01-02 MED ORDER — ESOMEPRAZOLE MAGNESIUM 40 MG PO CPDR: 40.0000 mg | DELAYED_RELEASE_CAPSULE | Freq: Every day | ORAL | 6 refills | Status: DC

## 2024-01-02 NOTE — Progress Notes (Unsigned)
 GASTROENTEROLOGY OUTPATIENT CLINIC VISIT   Primary Care Provider Soundra Pilon, FNP [phone] [address] [place] [address] [phone]  Referring Provider Soundra Pilon, FNP [phone] [address] [place],  [place] [address] [phone]  Patient Profile: Nathaniel Turner is a 63 y.o. male with a pmh significant for  The patient presents to the [facility] for an evaluation and management of problem(s) noted below:  Problem List No diagnosis found.  History of Present Illness    The patient does/does not take NSAIDs or BC/Goody Powder. Patient has/has not had an EGD. Patient has/has not had a Colonoscopy.  GI Review of Systems Positive as above Negative for  Pyrosis; Reflux; Regurgitation; Dysphagia; Odynophagia; Globus; Post-prandial cough; Nocturnal cough; Nasal regurgitation; Epigastric pain; Nausea; Vomiting; Hematemesis; Jaundice; Change in Appetite; Early satiety; Abdominal pain; Abdominal bloating; Eructation; Flatulence; Change in BM Frequency; Change in BM Consistency; Constipation; Diarrhea; Incontinence; Urgency; Tenesmus; Hematochezia; Melena  Review of Systems General: Denies fevers/chills/weight loss/night sweats HEENT: Denies oral lesions/sore throat/headaches/visual changes Cardiovascular: Denies chest pain/palpitations Pulmonary: Denies shortness of breath/cough Gastroenterological: See HPI Genitourinary: Denies darkened urine or hematuria Hematological: Denies easy bruising/bleeding Endocrine: Denies temperature intolerance Dermatological: Denies skin changes Psychological: Mood is stable Allergy & Immunology: Denies severe allergic reactions Musculoskeletal: Denies new arthralgias   Medications Current Outpatient Medications  Medication Sig Dispense Refill   cetirizine (ZYRTEC) 10 MG tablet Take 10 mg by mouth daily.     hydrochlorothiazide (HYDRODIURIL) 12.5 MG tablet Take 12.5 mg by mouth daily.      JARDIANCE 25 MG TABS tablet Take 25 mg by mouth daily.     losartan (COZAAR) 100 MG tablet Take 100 mg by mouth daily.     mesalamine (LIALDA) 1.2 g EC tablet TAKE 4 TABLETS BY MOUTH IN THE MORNING 360 tablet 0   metFORMIN (GLUCOPHAGE) 500 MG tablet Take 1,000 mg by mouth daily with breakfast.     rosuvastatin (CRESTOR) 10 MG tablet Take 10 mg by mouth daily.     No current facility-administered medications for this visit.    Allergies Allergies  Allergen Reactions   Codeine Itching   Oxycodone Rash    Histories Past Medical History:  Diagnosis Date   Anxiety and depression    Bursitis    left shoulder   Colitis    secondary to [facility] treatment   Colitis    Colon polyp    adenomatous   Diabetic peripheral neuropathy (HCC) 03/07/2018   Esophagitis    severe-due to [facility] treatment   History of throat cancer    Hyperlipidemia    Hypertension    Internal hemorrhoids without mention of complication    Melanoma (HCC)    melanoma dx 2007;   Memory loss 08/03/2023   Metastatic malignant melanoma (HCC)    T2DM (type 2 diabetes mellitus) (HCC)    Past Surgical History:  Procedure Laterality Date   A-FLUTTER ABLATION N/A 09/28/2023   Procedure: A-FLUTTER ABLATION;  Surgeon: [name], MD;  Location[facility]VAS[facility] CV LAB;  Service: Cardiovascular;  Laterality: N/A;   AXILLARY LYMPH NODE BIOPSY Right 06/17/2014   hx. melanoma 2 spots bx.   COLONOSCOPY     LOOP RECORDER INSERTION N/A 09/28/2023   Procedure: LOOP RECORDER INSERTION;  Surgeon: [name], MD;  Location: [facility] INVASIVE CV LAB;  Service: Cardiovascular;  Laterality: N/A;   MELANOMA EXCISION  2008/2015   Lymph node removal/neck and groin   POLYPECTOMY     radiation treatment  7 weeks in 2013   SHOULDER SURGERY  10/24/2008   left shoulder   Social History   Socioeconomic History   Marital status: Married    Spouse name: Not on file   Number of children: 1   Years of education: 14   Highest education  level: Not on file  Occupational History   Occupation: Facilities manager -Herbalist: [employer]  Tobacco Use   Smoking status: Former    Current packs/day: 0.00    Types: Cigarettes    Quit date: 06/07/1979    Years since quitting: 44.6   Smokeless tobacco: Never  Vaping Use   Vaping status: Never Used  Substance and Sexual Activity   Alcohol use: No   Drug use: No   Sexual activity: Not on file  Other Topics Concern   Not on file  Social History Narrative   Lives w/ spouse   Caffeine use: Sugar free energy drink   Left handed    Social Drivers of Health   Financial Resource Strain: Not on file  Food Insecurity: No Food Insecurity (03/03/2023)   Hunger Vital Sign    Worried About Running Out of Food in the Last Year: Never true    Ran Out of Food in the Last Year: Never true  Transportation Needs: No Transportation Needs (03/03/2023)   PRAPARE - Administrator, Civil Service (Medical): No    Lack of Transportation (Non-Medical): No  Physical Activity: Not on file  Stress: Not on file  Social Connections: Not on file  Intimate Partner Violence: Not At Risk (03/03/2023)   Humiliation, Afraid, Rape, and Kick questionnaire    Fear of Current or Ex-Partner: No    Emotionally Abused: No    Physically Abused: No    Sexually Abused: No   Family History  Problem Relation Age of Onset   Cataracts Mother    Suicidality Father    Heart disease Maternal Grandmother    Heart disease Maternal Grandfather    Diabetes Other        Grandmother   Coronary artery disease Other        Grandfather   Colon cancer Neg Hx    Esophageal cancer Neg Hx    Stomach cancer Neg Hx    Rectal cancer Neg Hx    I have reviewed his medical, social, and family history in detail and updated the electronic medical record as necessary.    PHYSICAL EXAMINATION  There were no vitals taken for this visit. Wt Readings from Last 3 Encounters:  11/03/23 208 lb (94.3 kg)   09/28/23 205 lb (93 kg)  09/12/23 208 lb (94.3 kg)   GEN: NAD, appears stated age, doesn't appear chronically ill PSYCH: Cooperative, without pressured speech EYE: Conjunctivae pink, sclerae anicteric ENT: MMM NECK: Supple CV: Nontachycardic RESP: No audible wheezing GI: NABS, soft, NT/ND, without rebound or guarding, no HSM appreciated GU: DRE shows MSK/EXT: _ edema, no palmar erythema SKIN: No jaundice, no spider angiomata, no concerning rashes NEURO:  Alert & Oriented x 3, no focal deficits, no evidence of asterixis   REVIEW OF DATA  I reviewed the following data at the time of this encounter:  GI Procedures and Studies  January 2024 Colonoscopy - Hemorrhoids found on digital rectal exam. - Normal mucosa in the entire examined colon (improved from prior). Biopsied for surveillance and to assess disease activity. - Diverticulosis in the recto-sigmoid colon, in the sigmoid colon and in the descending  colon. - Non-bleeding non-thrombosed external and internal hemorrhoids.  Laboratory Studies  ***  Imaging Studies  ***   ASSESSMENT  Mr. Barna is a 63 y.o. male with a pmh significant for The patient is seen today for evaluation and management of:  No diagnosis found.  ***   PLAN  There are no diagnoses linked to this encounter.   No orders of the defined types were placed in this encounter.   New Prescriptions   No medications on file   Modified Medications   No medications on file    Planned Follow Up No follow-ups on file.   Total Time in Face-to-Face and in Coordination of Care for patient including independent/personal interpretation/review of prior testing, medical history, examination, medication adjustment, communicating results with the patient directly, and documentation within the EHR is ***.   Corliss Parish, MD [facility] Advanced Endoscopy Office # [phone]

## 2024-01-02 NOTE — Patient Instructions (Signed)
 We have sent the following medications to your pharmacy for you to pick up at your convenience: Nexium   Your provider has requested that you go to the basement level for lab work before leaving today. Press "B" on the elevator. The lab is located at the first door on the left as you exit the elevator.  _______________________________________________________  If your blood pressure at your visit was 140/90 or greater, please contact your primary care physician to follow up on this.  _______________________________________________________  If you are age 63 or older, your body mass index should be between 23-30. Your Body mass index is 27.44 kg/m. If this is out of the aforementioned range listed, please consider follow up with your Primary Care Provider.  If you are age 63 or younger, your body mass index should be between 19-25. Your Body mass index is 27.44 kg/m. If this is out of the aformentioned range listed, please consider follow up with your Primary Care Provider.   ________________________________________________________  The [facility] providers would like to encourage you to use [facility] to communicate with providers for non-urgent requests or questions.  Due to long hold times on the telephone, sending your provider a message by [facility] may be a faster and more efficient way to get a response.  Please allow 48 business hours for a response.  Please remember that this is for non-urgent requests.  _______________________________________________________  Due to recent changes in healthcare laws, you may see the results of your imaging and laboratory studies on MyChart before your provider has had a chance to review them.  We understand that in some cases there may be results that are confusing or concerning to you. Not all laboratory results come back in the same time frame and the provider may be waiting for multiple results in order to interpret others.  Please give [place] 48 hours in  order for your provider to thoroughly review all the results before contacting the office for clarification of your results.   Thank you for choosing me and [facility].  Dr. Meridee Score

## 2024-01-03 ENCOUNTER — Other Ambulatory Visit

## 2024-01-03 DIAGNOSIS — K449 Diaphragmatic hernia without obstruction or gangrene: Secondary | ICD-10-CM

## 2024-01-03 DIAGNOSIS — K219 Gastro-esophageal reflux disease without esophagitis: Secondary | ICD-10-CM

## 2024-01-03 DIAGNOSIS — K529 Noninfective gastroenteritis and colitis, unspecified: Secondary | ICD-10-CM

## 2024-01-03 DIAGNOSIS — R1310 Dysphagia, unspecified: Secondary | ICD-10-CM

## 2024-01-04 NOTE — Addendum Note (Signed)
 Addended by: Elease Etienne A on: 01/04/2024 11:09 AM   Modules accepted: Orders

## 2024-01-04 NOTE — Progress Notes (Signed)
 Carelink Summary Report / Loop Recorder

## 2024-01-05 ENCOUNTER — Encounter: Payer: Self-pay | Admitting: Gastroenterology

## 2024-01-09 ENCOUNTER — Ambulatory Visit (INDEPENDENT_AMBULATORY_CARE_PROVIDER_SITE_OTHER): Payer: Managed Care, Other (non HMO)

## 2024-01-09 DIAGNOSIS — I483 Typical atrial flutter: Secondary | ICD-10-CM

## 2024-01-09 LAB — CUP PACEART REMOTE DEVICE CHECK
Date Time Interrogation Session: 20250318065200
Pulse Gen Serial Number: 123515

## 2024-01-10 ENCOUNTER — Encounter: Payer: Self-pay | Admitting: Gastroenterology

## 2024-01-10 LAB — CALPROTECTIN: Calprotectin: 163 ug/g — ABNORMAL HIGH

## 2024-01-11 ENCOUNTER — Other Ambulatory Visit: Payer: Self-pay

## 2024-01-11 DIAGNOSIS — K529 Noninfective gastroenteritis and colitis, unspecified: Secondary | ICD-10-CM

## 2024-01-30 ENCOUNTER — Encounter: Payer: Self-pay | Admitting: Gastroenterology

## 2024-02-04 ENCOUNTER — Encounter: Payer: Self-pay | Admitting: Certified Registered Nurse Anesthetist

## 2024-02-07 ENCOUNTER — Ambulatory Visit (AMBULATORY_SURGERY_CENTER): Admitting: Gastroenterology

## 2024-02-07 ENCOUNTER — Encounter: Payer: Self-pay | Admitting: Gastroenterology

## 2024-02-07 ENCOUNTER — Encounter: Admitting: Gastroenterology

## 2024-02-07 VITALS — BP 135/72 | HR 60 | Temp 97.8°F | Resp 20 | Ht 73.0 in | Wt 208.0 lb

## 2024-02-07 DIAGNOSIS — K219 Gastro-esophageal reflux disease without esophagitis: Secondary | ICD-10-CM | POA: Diagnosis not present

## 2024-02-07 DIAGNOSIS — K319 Disease of stomach and duodenum, unspecified: Secondary | ICD-10-CM | POA: Diagnosis not present

## 2024-02-07 DIAGNOSIS — K259 Gastric ulcer, unspecified as acute or chronic, without hemorrhage or perforation: Secondary | ICD-10-CM

## 2024-02-07 DIAGNOSIS — K2970 Gastritis, unspecified, without bleeding: Secondary | ICD-10-CM

## 2024-02-07 DIAGNOSIS — K31A0 Gastric intestinal metaplasia, unspecified: Secondary | ICD-10-CM

## 2024-02-07 DIAGNOSIS — K449 Diaphragmatic hernia without obstruction or gangrene: Secondary | ICD-10-CM

## 2024-02-07 DIAGNOSIS — K229 Disease of esophagus, unspecified: Secondary | ICD-10-CM

## 2024-02-07 MED ORDER — ESOMEPRAZOLE MAGNESIUM 40 MG PO CPDR: 40.0000 mg | DELAYED_RELEASE_CAPSULE | Freq: Two times a day (BID) | ORAL | 12 refills | Status: AC

## 2024-02-07 MED ORDER — SODIUM CHLORIDE 0.9 % IV SOLN: 500.0000 mL | INTRAVENOUS | Status: DC

## 2024-02-07 NOTE — Op Note (Signed)
 [facility] Patient Name: Nathaniel Turner Procedure Date: 02/07/2024 8:13 AM MRN: [id number] Endoscopist: Corliss Parish , MD, [id number] Age: 63 Referring MD:  Date of Birth: 13-May-1961 Gender: Male Account #: [id number] Procedure:                Upper GI endoscopy Indications:              Dysphagia Medicines:                Monitored Anesthesia Care Procedure:                Pre-Anesthesia Assessment:                           - Prior to the procedure, a History and Physical                            was performed, and patient medications and                            allergies were reviewed. The patient's tolerance of                            previous anesthesia was also reviewed. The risks                            and benefits of the procedure and the sedation                            options and risks were discussed with the patient.                            All questions were answered, and informed consent                            was obtained. Prior Anticoagulants: The patient has                            taken no anticoagulant or antiplatelet agents. ASA                            Grade Assessment: II - A patient with mild systemic                            disease. After reviewing the risks and benefits,                            the patient was deemed in satisfactory condition to                            undergo the procedure.                           After obtaining informed consent, the endoscope was  passed under direct vision. Throughout the                            procedure, the patient's blood pressure, pulse, and                            oxygen saturations were monitored continuously. The                            GIF [id number] #[id number] was introduced through the                            mouth, and advanced to the second part of duodenum.                            The upper GI endoscopy was accomplished  without                            difficulty. The patient tolerated the procedure. Scope In: Scope Out: Findings:                 No gross lesions were noted in the entire                            esophagus. Biopsies were taken with a cold forceps                            for histology. After the rest of the EGD was                            completed, a guidewire was placed and the scope was                            withdrawn. Dilation was performed with a Savary                            dilator with mild resistance at 16 mm and moderate                            resistance at 18 mm. The dilation site was examined                            following endoscope reinsertion and showed no                            change.                           The Z-line was irregular and was found 44 cm from                            the incisors.  Two non-bleeding linear gastric ulcers with a clean                            ulcer base (Forrest Class III) were found on the                            greater curvature of the stomach. The largest                            lesion was 13 mm in largest dimension.                           Patchy moderate inflammation characterized by                            congestion (edema), erythema and granularity was                            found in the entire examined stomach. Biopsies were                            taken with a cold forceps for histology and                            Helicobacter pylori testing.                           Localized moderate mucosal changes characterized by                            granularity, scalloping and altered texture were                            found in the duodenal bulb (suggestive of Brunner                            gland hyperplasia). Biopsies were taken with a cold                            forceps for histology to rule out dysplasia.                            Mucosal prominence was found at the major papilla.                            Biopsies were taken with a cold forceps for                            histology to rule out adenomatous change. Complications:            No immediate complications. Estimated Blood Loss:     Estimated blood loss was minimal. Impression:               - No gross lesions in the entire esophagus.  Biopsied. Dilated.                           - Z-line irregular, 44 cm from the incisors.                           - Non-bleeding gastric ulcers with a clean ulcer                            base (Forrest Class III).                           - Gastritis. Biopsied.                           - Mucosal changes in the duodenum in bulb -                            suggestive of Brunner gland hyperplasia. Biopsied.                           - Mucosal prominence of the major papilla. Biopsied                            to rule out dysplasia. Recommendation:           - The patient will be observed post-procedure,                            until all discharge criteria are met.                           - Discharge patient to home.                           - Patient has a contact number available for                            emergencies. The signs and symptoms of potential                            delayed complications were discussed with the                            patient. Return to normal activities tomorrow.                            Written discharge instructions were provided to the                            patient.                           - Dilation diet as per protocol.                           -  Please use Cepacol or Halls Lozenges +/-                            Chloraseptic spray for next 72-96 hours to aid in                            sore thoat should you experience this.                           - Increase PPI to 40 mg twice daily for now.                           -  Continue present medications otherwise.                           - Await pathology results.                           - Repeat upper endoscopy in 4 months to check                            healing of gastric ulcers.                           - If continues to have issues of dysphagia, then                            consider Esophageal Manometry.                           - The findings and recommendations were discussed                            with the patient.                           - The findings and recommendations were discussed                            with the patient's family. Yong Henle, MD 02/07/2024 8:45:02 AM

## 2024-02-07 NOTE — Progress Notes (Signed)
 Report given to PACU, vss

## 2024-02-07 NOTE — Progress Notes (Signed)
 GASTROENTEROLOGY PROCEDURE H&P NOTE   Primary Care Physician: Soundra Pilon, FNP  HPI: Nathaniel Turner is a 63 y.o. male who presents for EGD for dysphagia.  Past Medical History:  Diagnosis Date   Anxiety and depression    Bursitis    left shoulder   Colitis    secondary to [facility] treatment   Colitis    Colon polyp    adenomatous   Diabetic peripheral neuropathy (HCC) 03/07/2018   Esophagitis    severe-due to [facility] treatment   History of throat cancer    Hyperlipidemia    Hypertension    Internal hemorrhoids without mention of complication    Melanoma (HCC)    melanoma dx 2007;   Memory loss 08/03/2023   Metastatic malignant melanoma (HCC)    T2DM (type 2 diabetes mellitus) (HCC)    Past Surgical History:  Procedure Laterality Date   A-FLUTTER ABLATION N/A 09/28/2023   Procedure: A-FLUTTER ABLATION;  Surgeon: [name], MD;  Location: [facility] INVASIVE CV LAB;  Service: Cardiovascular;  Laterality: N/A;   AXILLARY LYMPH NODE BIOPSY Right 06/17/2014   hx. melanoma 2 spots bx.   COLONOSCOPY     LOOP RECORDER INSERTION N/A 09/28/2023   Procedure: LOOP RECORDER INSERTION;  Surgeon: [name], MD;  Location: [facility] INVASIVE CV LAB;  Service: Cardiovascular;  Laterality: N/A;   MELANOMA EXCISION  2008/2015   Lymph node removal/neck and groin   POLYPECTOMY     radiation treatment     7 weeks in 2013   SHOULDER SURGERY  10/24/2008   left shoulder   Current Outpatient Medications  Medication Sig Dispense Refill   cetirizine (ZYRTEC) 10 MG tablet Take 10 mg by mouth daily.     esomeprazole (NEXIUM) 40 MG capsule Take 1 capsule (40 mg total) by mouth daily at 12 noon. 30 capsule 6   hydrochlorothiazide (HYDRODIURIL) 12.5 MG tablet Take 12.5 mg by mouth daily.     JARDIANCE 25 MG TABS tablet Take 25 mg by mouth daily.     losartan (COZAAR) 100 MG tablet Take 100 mg by mouth daily.     mesalamine (LIALDA) 1.2 g EC tablet TAKE 4 TABLETS BY MOUTH IN THE MORNING 360 tablet  0   metFORMIN (GLUCOPHAGE) 500 MG tablet Take 1,000 mg by mouth daily with breakfast.     rosuvastatin (CRESTOR) 10 MG tablet Take 10 mg by mouth daily.     Current Facility-Administered Medications  Medication Dose Route Frequency Provider Last Rate Last Admin   0.9 %  sodium chloride infusion  500 mL Intravenous Continuous Mansouraty, Netty Starring., MD        Current Outpatient Medications:    cetirizine (ZYRTEC) 10 MG tablet, Take 10 mg by mouth daily., Disp: , Rfl:    esomeprazole (NEXIUM) 40 MG capsule, Take 1 capsule (40 mg total) by mouth daily at 12 noon., Disp: 30 capsule, Rfl: 6   hydrochlorothiazide (HYDRODIURIL) 12.5 MG tablet, Take 12.5 mg by mouth daily., Disp: , Rfl:    JARDIANCE 25 MG TABS tablet, Take 25 mg by mouth daily., Disp: , Rfl:    losartan (COZAAR) 100 MG tablet, Take 100 mg by mouth daily., Disp: , Rfl:    mesalamine (LIALDA) 1.2 g EC tablet, TAKE 4 TABLETS BY MOUTH IN THE MORNING, Disp: 360 tablet, Rfl: 0   metFORMIN (GLUCOPHAGE) 500 MG tablet, Take 1,000 mg by mouth daily with breakfast., Disp: , Rfl:    rosuvastatin (CRESTOR) 10 MG tablet, Take 10 mg  by mouth daily., Disp: , Rfl:   Current Facility-Administered Medications:    0.9 %  sodium chloride infusion, 500 mL, Intravenous, Continuous, Mansouraty, Albino Alu., MD Allergies  Allergen Reactions   Codeine Itching   Oxycodone Rash   Family History  Problem Relation Age of Onset   Cataracts Mother    Suicidality Father    Heart disease Maternal Grandmother    Heart disease Maternal Grandfather    Diabetes Other        Grandmother   Coronary artery disease Other        Grandfather   Colon cancer Neg Hx    Esophageal cancer Neg Hx    Stomach cancer Neg Hx    Rectal cancer Neg Hx    Social History   Socioeconomic History   Marital status: Married    Spouse name: Not on file   Number of children: 1   Years of education: 14   Highest education level: Not on file  Occupational History    Occupation: Facilities manager -Herbalist: [employer]  Tobacco Use   Smoking status: Former    Current packs/day: 0.00    Types: Cigarettes    Quit date: 06/07/1979    Years since quitting: 44.7   Smokeless tobacco: Never  Vaping Use   Vaping status: Never Used  Substance and Sexual Activity   Alcohol use: No   Drug use: No   Sexual activity: Not on file  Other Topics Concern   Not on file  Social History Narrative   Lives w/ spouse   Caffeine use: Sugar free energy drink   Left handed    Social Drivers of Health   Financial Resource Strain: Not on file  Food Insecurity: No Food Insecurity (03/03/2023)   Hunger Vital Sign    Worried About Running Out of Food in the Last Year: Never true    Ran Out of Food in the Last Year: Never true  Transportation Needs: No Transportation Needs (03/03/2023)   PRAPARE - Administrator, Civil Service (Medical): No    Lack of Transportation (Non-Medical): No  Physical Activity: Not on file  Stress: Not on file  Social Connections: Not on file  Intimate Partner Violence: Not At Risk (03/03/2023)   Humiliation, Afraid, Rape, and Kick questionnaire    Fear of Current or Ex-Partner: No    Emotionally Abused: No    Physically Abused: No    Sexually Abused: No    Physical Exam: Today's Vitals   02/07/24 0732 02/07/24 0737  BP: 128/78   Pulse: (!) 59   Temp: 97.8 F (36.6 C) 97.8 F (36.6 C)  SpO2: 98%   Weight: 208 lb (94.3 kg)   Height: 6\' 1"  (1.854 m)    Body mass index is 27.44 kg/m. GEN: NAD EYE: Sclerae anicteric ENT: MMM CV: Non-tachycardic GI: Soft, NT/ND NEURO:  Alert & Oriented x 3  Lab Results: No results for input(s): "WBC", "HGB", "HCT", "PLT" in the last 72 hours. BMET No results for input(s): "NA", "K", "CL", "CO2", "GLUCOSE", "BUN", "CREATININE", "CALCIUM" in the last 72 hours. LFT No results for input(s): "PROT", "ALBUMIN", "AST", "ALT", "ALKPHOS", "BILITOT", "BILIDIR", "IBILI" in the  last 72 hours. PT/INR No results for input(s): "LABPROT", "INR" in the last 72 hours.   Impression / Plan: This is a 63 y.o.male who presents for EGD for dysphagia.  The risks and benefits of endoscopic evaluation/treatment were discussed with the patient and/or family; these  include but are not limited to the risk of perforation, infection, bleeding, missed lesions, lack of diagnosis, severe illness requiring hospitalization, as well as anesthesia and sedation related illnesses.  The patient's history has been reviewed, patient examined, no change in status, and deemed stable for procedure.  The patient and/or family is agreeable to proceed.    Yong Henle, MD [facility] Office # [phone]

## 2024-02-07 NOTE — Patient Instructions (Addendum)
 Please read handouts provided. Dilation Diet. Increase Nexium 40mg  to twice daily. Continue present medications. Can use Cepacol / Halls Lozenges to aid in sore throat. Await pathology results. Repeat upper endoscopy in 4 months.  YOU HAD AN ENDOSCOPIC PROCEDURE TODAY AT THE [facility]:   Refer to the procedure report that was given to you for any specific questions about what was found during the examination.  If the procedure report does not answer your questions, please call your gastroenterologist to clarify.  If you requested that your care partner not be given the details of your procedure findings, then the procedure report has been included in a sealed envelope for you to review at your convenience later.  YOU SHOULD EXPECT: Some feelings of bloating in the abdomen. Passage of more gas than usual.  Walking can help get rid of the air that was put into your GI tract during the procedure and reduce the bloating. If you had a lower endoscopy (such as a colonoscopy or flexible sigmoidoscopy) you may notice spotting of blood in your stool or on the toilet paper. If you underwent a bowel prep for your procedure, you may not have a normal bowel movement for a few days.  Please Note:  You might notice some irritation and congestion in your nose or some drainage.  This is from the oxygen used during your procedure.  There is no need for concern and it should clear up in a day or so.  SYMPTOMS TO REPORT IMMEDIATELY:  Following upper endoscopy (EGD)  Vomiting of blood or coffee ground material  New chest pain or pain under the shoulder blades  Painful or persistently difficult swallowing  New shortness of breath  Fever of 100F or higher  Black, tarry-looking stools  For urgent or emergent issues, a gastroenterologist can be reached at any hour by calling [phone]. Do not use MyChart messaging for urgent concerns.    DIET:   Drink plenty of fluids but you should avoid  alcoholic beverages for 24 hours.  ACTIVITY:  You should plan to take it easy for the rest of today and you should NOT DRIVE or use heavy machinery until tomorrow (because of the sedation medicines used during the test).    FOLLOW UP: Our staff will call the number listed on your records the next business day following your procedure.  We will call around 7:15- 8:00 am to check on you and address any questions or concerns that you may have regarding the information given to you following your procedure. If we do not reach you, we will leave a message.     If any biopsies were taken you will be contacted by phone or by letter within the next 1-3 weeks.  Please call us  at [phone] if you have not heard about the biopsies in 3 weeks.    SIGNATURES/CONFIDENTIALITY: You and/or your care partner have signed paperwork which will be entered into your electronic medical record.  These signatures attest to the fact that that the information above on your After Visit Summary has been reviewed and is understood.  Full responsibility of the confidentiality of this discharge information lies with you and/or your care-partner.

## 2024-02-07 NOTE — Progress Notes (Signed)
 Called to room to assist during endoscopic procedure.  Patient ID and intended procedure confirmed with present staff. Received instructions for my participation in the procedure from the performing physician.

## 2024-02-07 NOTE — Progress Notes (Signed)
 0819 Robinul 0.1 mg IV given due large amount of secretions upon assessment.  MD made aware, vss

## 2024-02-07 NOTE — Progress Notes (Signed)
 Pt's states no medical or surgical changes since previsit or office visit.

## 2024-02-08 ENCOUNTER — Telehealth: Payer: Self-pay

## 2024-02-08 NOTE — Telephone Encounter (Signed)
  Follow up Call-     02/07/2024    7:37 AM 11/09/2022    7:16 AM  Call back number  Post procedure Call Back phone  # [phone]  Permission to leave phone message Yes Yes     Patient questions:  Do you have a fever, pain , or abdominal swelling? No. Pain Score  0 *  Have you tolerated food without any problems? Yes.    Have you been able to return to your normal activities? Yes.    Do you have any questions about your discharge instructions: Diet   No. Medications  No. Follow up visit  No.  Do you have questions or concerns about your Care? No.  Actions: * If pain score is 4 or above: No action needed, pain <4.

## 2024-02-09 LAB — SURGICAL PATHOLOGY

## 2024-02-12 ENCOUNTER — Encounter: Payer: Self-pay | Admitting: Gastroenterology

## 2024-02-26 NOTE — Progress Notes (Signed)
 Carelink Summary Report / Loop Recorder

## 2024-02-28 ENCOUNTER — Ambulatory Visit (INDEPENDENT_AMBULATORY_CARE_PROVIDER_SITE_OTHER)

## 2024-02-28 DIAGNOSIS — I483 Typical atrial flutter: Secondary | ICD-10-CM | POA: Diagnosis not present

## 2024-02-29 ENCOUNTER — Telehealth: Payer: Self-pay

## 2024-02-29 LAB — CUP PACEART REMOTE DEVICE CHECK
Date Time Interrogation Session: 20250507163900
Pulse Gen Serial Number: 123515

## 2024-02-29 NOTE — Telephone Encounter (Signed)
 Alert received from [employer] for 1 Alert for Pause event x 5.2 seconds on 02/24/24 @ 1242p. True pause.    Attempted to contact patient. No answer, left message to call back.

## 2024-03-01 NOTE — Telephone Encounter (Signed)
 Pt asymptomatic. No recollection of anything out of the norm. Will continue to monitor.

## 2024-03-01 NOTE — Telephone Encounter (Signed)
 LMTCB

## 2024-04-01 ENCOUNTER — Ambulatory Visit (INDEPENDENT_AMBULATORY_CARE_PROVIDER_SITE_OTHER)

## 2024-04-01 DIAGNOSIS — I483 Typical atrial flutter: Secondary | ICD-10-CM | POA: Diagnosis not present

## 2024-04-01 LAB — CUP PACEART REMOTE DEVICE CHECK
Date Time Interrogation Session: 20250609054000
Pulse Gen Serial Number: 123515

## 2024-04-07 ENCOUNTER — Ambulatory Visit: Payer: Self-pay | Admitting: Cardiology

## 2024-04-08 NOTE — Progress Notes (Signed)
 Carelink Summary Report / Loop Recorder

## 2024-04-08 NOTE — Addendum Note (Signed)
 Addended by: Lott Rouleau A on: 04/08/2024 02:18 PM   Modules accepted: Orders

## 2024-04-12 ENCOUNTER — Other Ambulatory Visit: Payer: Self-pay | Admitting: Physician Assistant

## 2024-05-02 ENCOUNTER — Ambulatory Visit

## 2024-05-02 DIAGNOSIS — I483 Typical atrial flutter: Secondary | ICD-10-CM | POA: Diagnosis not present

## 2024-05-02 LAB — CUP PACEART REMOTE DEVICE CHECK
Date Time Interrogation Session: 20250710054000
Pulse Gen Serial Number: 123515

## 2024-05-12 ENCOUNTER — Ambulatory Visit: Payer: Self-pay | Admitting: Cardiology

## 2024-05-15 NOTE — Progress Notes (Signed)
 Bsx Loop Recorder

## 2024-06-03 ENCOUNTER — Telehealth: Payer: Self-pay

## 2024-06-03 ENCOUNTER — Ambulatory Visit (INDEPENDENT_AMBULATORY_CARE_PROVIDER_SITE_OTHER)

## 2024-06-03 DIAGNOSIS — I483 Typical atrial flutter: Secondary | ICD-10-CM | POA: Diagnosis not present

## 2024-06-03 NOTE — Telephone Encounter (Signed)
 1 brady event x 12 sec V-rate 28 bpm on 8/7 @ 1109.  EGM with SB.  Cannot exclude a non conducted P wave.     LM to assess symptoms given daytime brady event.

## 2024-06-04 LAB — CUP PACEART REMOTE DEVICE CHECK
Date Time Interrogation Session: 20250811053200
Pulse Gen Serial Number: 123515

## 2024-06-04 NOTE — Telephone Encounter (Signed)
 Spoke with Pt.  He denies any symptoms during this time.  Will continue to monitor.

## 2024-06-10 ENCOUNTER — Ambulatory Visit: Admitting: Gastroenterology

## 2024-06-30 ENCOUNTER — Ambulatory Visit: Payer: Self-pay | Admitting: Cardiology

## 2024-07-04 ENCOUNTER — Ambulatory Visit

## 2024-07-04 DIAGNOSIS — I483 Typical atrial flutter: Secondary | ICD-10-CM | POA: Diagnosis not present

## 2024-07-04 LAB — CUP PACEART REMOTE DEVICE CHECK
Date Time Interrogation Session: 20250911063000
Pulse Gen Serial Number: 123515

## 2024-07-07 ENCOUNTER — Other Ambulatory Visit: Payer: Self-pay | Admitting: Gastroenterology

## 2024-07-11 NOTE — Progress Notes (Signed)
 Remote Loop Recorder Transmission

## 2024-07-14 ENCOUNTER — Ambulatory Visit: Payer: Self-pay | Admitting: Cardiology

## 2024-07-18 ENCOUNTER — Encounter: Payer: Self-pay | Admitting: Gastroenterology

## 2024-07-18 NOTE — Progress Notes (Signed)
 Remote Loop Recorder Transmission

## 2024-08-01 NOTE — Progress Notes (Signed)
 Remote Loop Recorder Transmission

## 2024-08-05 ENCOUNTER — Ambulatory Visit (INDEPENDENT_AMBULATORY_CARE_PROVIDER_SITE_OTHER)

## 2024-08-05 DIAGNOSIS — I483 Typical atrial flutter: Secondary | ICD-10-CM | POA: Diagnosis not present

## 2024-08-05 LAB — CUP PACEART REMOTE DEVICE CHECK
Date Time Interrogation Session: 20251013043000
Pulse Gen Serial Number: 123515

## 2024-08-06 NOTE — Progress Notes (Signed)
 Remote Loop Recorder Transmission

## 2024-08-17 ENCOUNTER — Ambulatory Visit: Payer: Self-pay | Admitting: Cardiology

## 2024-08-20 ENCOUNTER — Ambulatory Visit

## 2024-08-20 VITALS — Ht 73.0 in | Wt 195.0 lb

## 2024-08-20 DIAGNOSIS — K219 Gastro-esophageal reflux disease without esophagitis: Secondary | ICD-10-CM

## 2024-08-20 DIAGNOSIS — K259 Gastric ulcer, unspecified as acute or chronic, without hemorrhage or perforation: Secondary | ICD-10-CM

## 2024-08-20 NOTE — Progress Notes (Signed)
Pre visit completed via phone call; Patient verified name, DOB, and address;  No egg or soy allergy known to patient;  No issues known to pt with past sedation with any surgeries or procedures; Patient denies ever being told they had issues or difficulty with intubation;  No FH of Malignant Hyperthermia; Pt is not on diet pills; Pt is not on home 02;  Pt is not on blood thinners;  Pt denies issues with constipation;  No A fib or A flutter;  Have any cardiac testing pending--NO Insurance verified during PV appt--- [employer]  Pt can ambulate without assistance;  Pt denies use of chewing tobacco Discussed diabetic/weight loss medication holds; Discussed NSAID holds; Checked BMI to be less than 50; Pt instructed to use [employer] or GoodRx for a price reduction on prep  Patient's chart reviewed by Nathaniel Turner CNRA prior to previsit and patient appropriate for the [facility].  Pre visit completed and red dot placed by patient's name on their procedure day (on provider's schedule).    Instructions sent to MyChart per patient request;

## 2024-09-03 ENCOUNTER — Ambulatory Visit: Admitting: Gastroenterology

## 2024-09-03 ENCOUNTER — Encounter: Payer: Self-pay | Admitting: Gastroenterology

## 2024-09-03 VITALS — BP 115/63 | HR 62 | Temp 97.7°F | Resp 9 | Ht 73.0 in | Wt 195.0 lb

## 2024-09-03 DIAGNOSIS — K319 Disease of stomach and duodenum, unspecified: Secondary | ICD-10-CM

## 2024-09-03 DIAGNOSIS — R1310 Dysphagia, unspecified: Secondary | ICD-10-CM

## 2024-09-03 DIAGNOSIS — K2970 Gastritis, unspecified, without bleeding: Secondary | ICD-10-CM

## 2024-09-03 DIAGNOSIS — K259 Gastric ulcer, unspecified as acute or chronic, without hemorrhage or perforation: Secondary | ICD-10-CM

## 2024-09-03 DIAGNOSIS — K219 Gastro-esophageal reflux disease without esophagitis: Secondary | ICD-10-CM

## 2024-09-03 DIAGNOSIS — K3189 Other diseases of stomach and duodenum: Secondary | ICD-10-CM | POA: Diagnosis present

## 2024-09-03 DIAGNOSIS — R1319 Other dysphagia: Secondary | ICD-10-CM

## 2024-09-03 MED ORDER — SODIUM CHLORIDE 0.9 % IV SOLN: 500.0000 mL | Freq: Once | INTRAVENOUS | Status: DC

## 2024-09-03 NOTE — Patient Instructions (Signed)
 Post Dilation Diet provided.    YOU HAD AN ENDOSCOPIC PROCEDURE TODAY AT THE [facility]:   Refer to the procedure report that was given to you for any specific questions about what was found during the examination.  If the procedure report does not answer your questions, please call your gastroenterologist to clarify.  If you requested that your care partner not be given the details of your procedure findings, then the procedure report has been included in a sealed envelope for you to review at your convenience later.  YOU SHOULD EXPECT: Some feelings of bloating in the abdomen. Passage of more gas than usual.  Walking can help get rid of the air that was put into your GI tract during the procedure and reduce the bloating. If you had a lower endoscopy (such as a colonoscopy or flexible sigmoidoscopy) you may notice spotting of blood in your stool or on the toilet paper. If you underwent a bowel prep for your procedure, you may not have a normal bowel movement for a few days.  Please Note:  You might notice some irritation and congestion in your nose or some drainage.  This is from the oxygen used during your procedure.  There is no need for concern and it should clear up in a day or so.  SYMPTOMS TO REPORT IMMEDIATELY:  Following upper endoscopy (EGD)  Vomiting of blood or coffee ground material  New chest pain or pain under the shoulder blades  Painful or persistently difficult swallowing  New shortness of breath  Fever of 100F or higher  Black, tarry-looking stools  For urgent or emergent issues, a gastroenterologist can be reached at any hour by calling [phone]. Do not use MyChart messaging for urgent concerns.    DIET:  Please see post dilation diet provided.  Drink plenty of fluids but you should avoid alcoholic beverages for 24 hours.  ACTIVITY:  You should plan to take it easy for the rest of today and you should NOT DRIVE or use heavy machinery until tomorrow  (because of the sedation medicines used during the test).    FOLLOW UP: Our staff will call the number listed on your records the next business day following your procedure.  We will call around 7:15- 8:00 am to check on you and address any questions or concerns that you may have regarding the information given to you following your procedure. If we do not reach you, we will leave a message.     If any biopsies were taken you will be contacted by phone or by letter within the next 1-3 weeks.  Please call us  at [phone] if you have not heard about the biopsies in 3 weeks.    SIGNATURES/CONFIDENTIALITY: You and/or your care partner have signed paperwork which will be entered into your electronic medical record.  These signatures attest to the fact that that the information above on your After Visit Summary has been reviewed and is understood.  Full responsibility of the confidentiality of this discharge information lies with you and/or your care-partner.

## 2024-09-03 NOTE — Progress Notes (Signed)
 Sedate, gd SR, tolerated procedure well, VSS, report to RN

## 2024-09-03 NOTE — Progress Notes (Signed)
 GASTROENTEROLOGY PROCEDURE H&P NOTE   Primary Care Physician: Marvene Prentice SAUNDERS, FNP  HPI: Nathaniel Turner is a 63 y.o. male who presents for history of gastric ulcers.  Past Medical History:  Diagnosis Date   Anxiety and depression    Bursitis    left shoulder   Colitis    secondary to [facility] treatment   Colitis    Colon polyp    adenomatous   Diabetic peripheral neuropathy (HCC) 03/07/2018   Esophagitis    severe-due to [facility] treatment   History of throat cancer    Hyperlipidemia    Hypertension    Internal hemorrhoids without mention of complication    Melanoma (HCC)    melanoma dx 2007;   Memory loss 08/03/2023   Metastatic malignant melanoma (HCC)    T2DM (type 2 diabetes mellitus) (HCC)    Past Surgical History:  Procedure Laterality Date   A-FLUTTER ABLATION N/A 09/28/2023   Procedure: A-FLUTTER ABLATION;  Surgeon: Kennyth Chew, MD;  Location: [facility] INVASIVE CV LAB;  Service: Cardiovascular;  Laterality: N/A;   AXILLARY LYMPH NODE BIOPSY Right 06/17/2014   hx. melanoma 2 spots bx.   COLONOSCOPY     LOOP RECORDER INSERTION N/A 09/28/2023   Procedure: LOOP RECORDER INSERTION;  Surgeon: Kennyth Chew, MD;  Location: [facility] INVASIVE CV LAB;  Service: Cardiovascular;  Laterality: N/A;   MELANOMA EXCISION  2008/2015   Lymph node removal/neck and groin   POLYPECTOMY     radiation treatment     7 weeks in 2013   SHOULDER SURGERY  10/24/2008   left shoulder   Current Outpatient Medications  Medication Sig Dispense Refill   cetirizine (ZYRTEC) 10 MG tablet Take 10 mg by mouth daily.     esomeprazole  (NEXIUM ) 40 MG capsule Take 1 capsule (40 mg total) by mouth in the morning and at bedtime. 60 capsule 12   hydrochlorothiazide (HYDRODIURIL) 12.5 MG tablet Take 12.5 mg by mouth daily.     JARDIANCE  25 MG TABS tablet Take 25 mg by mouth daily.     losartan (COZAAR) 100 MG tablet Take 100 mg by mouth daily.     mesalamine  (LIALDA ) 1.2 g EC tablet TAKE 4 TABLETS BY MOUTH IN  THE MORNING (Patient taking differently: Take 1.2 g by mouth daily with breakfast.) 360 tablet 0   metFORMIN (GLUCOPHAGE) 500 MG tablet Take 1,000 mg by mouth daily with breakfast.     rosuvastatin  (CRESTOR ) 10 MG tablet Take 10 mg by mouth daily.     No current facility-administered medications for this visit.    Current Outpatient Medications:    cetirizine (ZYRTEC) 10 MG tablet, Take 10 mg by mouth daily., Disp: , Rfl:    esomeprazole  (NEXIUM ) 40 MG capsule, Take 1 capsule (40 mg total) by mouth in the morning and at bedtime., Disp: 60 capsule, Rfl: 12   hydrochlorothiazide (HYDRODIURIL) 12.5 MG tablet, Take 12.5 mg by mouth daily., Disp: , Rfl:    JARDIANCE  25 MG TABS tablet, Take 25 mg by mouth daily., Disp: , Rfl:    losartan (COZAAR) 100 MG tablet, Take 100 mg by mouth daily., Disp: , Rfl:    mesalamine  (LIALDA ) 1.2 g EC tablet, TAKE 4 TABLETS BY MOUTH IN THE MORNING (Patient taking differently: Take 1.2 g by mouth daily with breakfast.), Disp: 360 tablet, Rfl: 0   metFORMIN (GLUCOPHAGE) 500 MG tablet, Take 1,000 mg by mouth daily with breakfast., Disp: , Rfl:    rosuvastatin  (CRESTOR ) 10 MG tablet, Take 10 mg  by mouth daily., Disp: , Rfl:  Allergies  Allergen Reactions   Codeine Itching and Rash   Oxycodone  Itching and Rash   Family History  Problem Relation Age of Onset   Cataracts Mother    Suicidality Father    Heart disease Maternal Grandmother    Heart disease Maternal Grandfather    Diabetes Other        Grandmother   Coronary artery disease Other        Grandfather   Colon cancer Neg Hx    Esophageal cancer Neg Hx    Stomach cancer Neg Hx    Rectal cancer Neg Hx    Colon polyps Neg Hx    Social History   Socioeconomic History   Marital status: Married    Spouse name: Not on file   Number of children: 1   Years of education: 14   Highest education level: Not on file  Occupational History   Occupation: Facilities Manager -Herbalist: [employer]   Tobacco Use   Smoking status: Former    Current packs/day: 0.00    Types: Cigarettes    Quit date: 06/07/1979    Years since quitting: 45.2   Smokeless tobacco: Never  Vaping Use   Vaping status: Never Used  Substance and Sexual Activity   Alcohol use: No   Drug use: No   Sexual activity: Not on file  Other Topics Concern   Not on file  Social History Narrative   Lives w/ spouse   Caffeine use: Sugar free energy drink   Left handed    Social Drivers of Health   Financial Resource Strain: Not on file  Food Insecurity: No Food Insecurity (03/03/2023)   Hunger Vital Sign    Worried About Running Out of Food in the Last Year: Never true    Ran Out of Food in the Last Year: Never true  Transportation Needs: No Transportation Needs (03/03/2023)   PRAPARE - Administrator, Civil Service (Medical): No    Lack of Transportation (Non-Medical): No  Physical Activity: Not on file  Stress: Not on file  Social Connections: Not on file  Intimate Partner Violence: Not At Risk (03/03/2023)   Humiliation, Afraid, Rape, and Kick questionnaire    Fear of Current or Ex-Partner: No    Emotionally Abused: No    Physically Abused: No    Sexually Abused: No    Physical Exam: There were no vitals filed for this visit. There is no height or weight on file to calculate BMI. GEN: NAD EYE: Sclerae anicteric ENT: MMM CV: Non-tachycardic GI: Soft, NT/ND NEURO:  Alert & Oriented x 3  Lab Results: No results for input(s): WBC, HGB, HCT, PLT in the last 72 hours. BMET No results for input(s): NA, K, CL, CO2, GLUCOSE, BUN, CREATININE, CALCIUM  in the last 72 hours. LFT No results for input(s): PROT, ALBUMIN, AST, ALT, ALKPHOS, BILITOT, BILIDIR, IBILI in the last 72 hours. PT/INR No results for input(s): LABPROT, INR in the last 72 hours.   Impression / Plan: This is a 63 y.o.male who presents for history of gastric ulcers.  The risks  and benefits of endoscopic evaluation/treatment were discussed with the patient and/or family; these include but are not limited to the risk of perforation, infection, bleeding, missed lesions, lack of diagnosis, severe illness requiring hospitalization, as well as anesthesia and sedation related illnesses.  The patient's history has been reviewed, patient examined, no change in status, and  deemed stable for procedure.  The patient and/or family was provided an opportunity to ask questions and all were answered.  The patient and/or family is agreeable to proceed.    Aloha Finner, MD [facility] Advanced [facility] Office # [phone]

## 2024-09-03 NOTE — Op Note (Signed)
 [facility] Patient Name: Nathaniel Turner Procedure Date: 09/03/2024 8:47 AM MRN: [id number] Endoscopist: Aloha Finner , MD, [id number] Age: 63 Referring MD:  Date of Birth: 03/05/1961 Gender: Male Account #: [id number] Procedure:                Upper GI endoscopy Indications:              Dysphagia, Follow-up of peptic ulcer Medicines:                Monitored Anesthesia Care Procedure:                Pre-Anesthesia Assessment:                           - Prior to the procedure, a History and Physical                            was performed, and patient medications and                            allergies were reviewed. The patient's tolerance of                            previous anesthesia was also reviewed. The risks                            and benefits of the procedure and the sedation                            options and risks were discussed with the patient.                            All questions were answered, and informed consent                            was obtained. Prior Anticoagulants: The patient has                            taken no anticoagulant or antiplatelet agents. ASA                            Grade Assessment: III - A patient with severe                            systemic disease. After reviewing the risks and                            benefits, the patient was deemed in satisfactory                            condition to undergo the procedure.                           After obtaining informed consent, the endoscope was  passed under direct vision. Throughout the                            procedure, the patient's blood pressure, pulse, and                            oxygen saturations were monitored continuously. The                            GIF [id number] #[id number] was introduced through the                            mouth, and advanced to the second part of duodenum.                            The upper  GI endoscopy was accomplished without                            difficulty. The patient tolerated the procedure. Scope In: Scope Out: Findings:                 No gross lesions were noted in the entire                            esophagus. After the rest of the EGD was                            compelteded a guidewire was placed and the scope                            was withdrawn. Dilation was performed with a Savary                            dilator with moderate resistance at 18 mm and 19                            mm. The dilation site was examined following each                            dilation with endoscope reinsertion and showed no                            change.                           The Z-line was irregular and was found 44 cm from                            the incisors.                           Patchy mild inflammation characterized by erythema  was found in the entire examined stomach. Biopsies                            were taken with a cold forceps for histology and                            Helicobacter pylori testing.                           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                           No gross lesions were noted in the duodenal sweep                            and in the second portion of the duodenum.                           Prominenhce of the major papilla and the minor                            papilla. Complications:            No immediate complications. Estimated Blood Loss:     Estimated blood loss was minimal. Impression:               - No gross lesions in the entire esophagus. Dilated                            to 19 mm Savary.                           - Z-line irregular, 44 cm from the incisors.                           - Gastritis. Biopsied.                           - Nodular mucosa in the duodenal bulb - consistent                             previously as benign and most likely Brunner gland.                           - No gross lesions in the duodenal sweep and in the                            second portion of the duodenum.                           - Prominence of the minor and major papilla. Recommendation:           -  The patient will be observed post-procedure,                            until all discharge criteria are met.                           - Discharge patient to home.                           - Patient has a contact number available for                            emergencies. The signs and symptoms of potential                            delayed complications were discussed with the                            patient. Return to normal activities tomorrow.                            Written discharge instructions were provided to the                            patient.                           - Dilation diet as per protocol.                           - If issues of dysphagia persist then recommend                            esophageal manometry. If improved, then consider                            repeat dilation PRN.                           - Continue present medications.                           - Await pathology results.                           - The findings and recommendations were discussed                            with the patient.                           - The findings and recommendations were discussed                            with the patient's family. Aloha Finner, MD 09/03/2024 9:19:49 AM

## 2024-09-03 NOTE — Progress Notes (Signed)
 Called to room to assist during endoscopic procedure.  Patient ID and intended procedure confirmed with present staff. Received instructions for my participation in the procedure from the performing physician.

## 2024-09-04 ENCOUNTER — Telehealth: Payer: Self-pay

## 2024-09-04 NOTE — Telephone Encounter (Signed)
  Follow up Call-     09/03/2024    8:02 AM 02/07/2024    7:37 AM 11/09/2022    7:16 AM  Call back number  Post procedure Call Back phone  # [phone]  Permission to leave phone message Yes Yes Yes     Patient questions:  Do you have a fever, pain , or abdominal swelling? No. Pain Score  0 *  Have you tolerated food without any problems? Yes.    Have you been able to return to your normal activities? Yes.    Do you have any questions about your discharge instructions: Diet   No. Medications  No. Follow up visit  No.  Do you have questions or concerns about your Care? No.  Actions: * If pain score is 4 or above: No action needed, pain <4.

## 2024-09-05 ENCOUNTER — Ambulatory Visit (INDEPENDENT_AMBULATORY_CARE_PROVIDER_SITE_OTHER)

## 2024-09-05 DIAGNOSIS — I483 Typical atrial flutter: Secondary | ICD-10-CM

## 2024-09-05 LAB — CUP PACEART REMOTE DEVICE CHECK
Date Time Interrogation Session: 20251113052600
Pulse Gen Serial Number: 123515

## 2024-09-06 ENCOUNTER — Ambulatory Visit: Payer: Self-pay | Admitting: Gastroenterology

## 2024-09-06 LAB — SURGICAL PATHOLOGY

## 2024-09-08 ENCOUNTER — Ambulatory Visit: Payer: Self-pay | Admitting: Cardiology

## 2024-09-10 NOTE — Progress Notes (Signed)
 Remote Loop Recorder Transmission

## 2024-10-04 ENCOUNTER — Other Ambulatory Visit: Payer: Self-pay | Admitting: Gastroenterology

## 2024-10-06 ENCOUNTER — Ambulatory Visit

## 2024-10-06 DIAGNOSIS — I483 Typical atrial flutter: Secondary | ICD-10-CM

## 2024-10-08 LAB — CUP PACEART REMOTE DEVICE CHECK
Date Time Interrogation Session: 20251214082200
Pulse Gen Serial Number: 123515

## 2024-10-11 ENCOUNTER — Ambulatory Visit: Payer: Self-pay | Admitting: Cardiology

## 2024-10-11 NOTE — Progress Notes (Signed)
 Remote Loop Recorder Transmission

## 2024-11-06 ENCOUNTER — Ambulatory Visit

## 2024-11-06 DIAGNOSIS — I483 Typical atrial flutter: Secondary | ICD-10-CM

## 2024-11-07 LAB — CUP PACEART REMOTE DEVICE CHECK
Date Time Interrogation Session: 20260115021900
Pulse Gen Serial Number: 123515

## 2024-11-10 ENCOUNTER — Ambulatory Visit: Payer: Self-pay | Admitting: Cardiology

## 2024-11-12 NOTE — Progress Notes (Signed)
 Remote Loop Recorder Transmission

## 2024-12-07 ENCOUNTER — Encounter

## 2025-01-07 ENCOUNTER — Encounter

## 2025-02-07 ENCOUNTER — Encounter

## 2025-03-10 ENCOUNTER — Encounter

## 2025-04-10 ENCOUNTER — Encounter

## 2025-05-11 ENCOUNTER — Encounter

## 2025-06-11 ENCOUNTER — Encounter

## 2025-07-12 ENCOUNTER — Encounter

## 2025-08-12 ENCOUNTER — Encounter

## 2025-09-12 ENCOUNTER — Encounter

## 2025-10-13 ENCOUNTER — Encounter
